# Patient Record
Sex: Female | Born: 1945 | Race: White | Hispanic: No | Smoking: Former smoker
Health system: Southern US, Community
[De-identification: ages and names within clinical notes are randomized; demographics above are authoritative.]

## PROBLEM LIST (undated history)

## (undated) DIAGNOSIS — G629 Polyneuropathy, unspecified: Secondary | ICD-10-CM

## (undated) DIAGNOSIS — I059 Rheumatic mitral valve disease, unspecified: Secondary | ICD-10-CM

## (undated) DIAGNOSIS — E119 Type 2 diabetes mellitus without complications: Secondary | ICD-10-CM

## (undated) DIAGNOSIS — F32A Depression, unspecified: Secondary | ICD-10-CM

## (undated) DIAGNOSIS — Z972 Presence of dental prosthetic device (complete) (partial): Secondary | ICD-10-CM

## (undated) DIAGNOSIS — I2510 Atherosclerotic heart disease of native coronary artery without angina pectoris: Secondary | ICD-10-CM

## (undated) DIAGNOSIS — Z95810 Presence of automatic (implantable) cardiac defibrillator: Secondary | ICD-10-CM

## (undated) DIAGNOSIS — E039 Hypothyroidism, unspecified: Secondary | ICD-10-CM

## (undated) DIAGNOSIS — R011 Cardiac murmur, unspecified: Secondary | ICD-10-CM

## (undated) DIAGNOSIS — Z950 Presence of cardiac pacemaker: Secondary | ICD-10-CM

## (undated) DIAGNOSIS — I509 Heart failure, unspecified: Secondary | ICD-10-CM

## (undated) DIAGNOSIS — C801 Malignant (primary) neoplasm, unspecified: Secondary | ICD-10-CM

## (undated) DIAGNOSIS — F329 Major depressive disorder, single episode, unspecified: Secondary | ICD-10-CM

## (undated) DIAGNOSIS — IMO0001 Reserved for inherently not codable concepts without codable children: Secondary | ICD-10-CM

## (undated) DIAGNOSIS — I272 Pulmonary hypertension, unspecified: Secondary | ICD-10-CM

## (undated) DIAGNOSIS — I495 Sick sinus syndrome: Secondary | ICD-10-CM

## (undated) HISTORY — PX: PACEMAKER INSERTION: SHX728

---

## 1985-04-13 DIAGNOSIS — C801 Malignant (primary) neoplasm, unspecified: Secondary | ICD-10-CM

## 1985-04-13 HISTORY — DX: Malignant (primary) neoplasm, unspecified: C80.1

## 2010-11-05 ENCOUNTER — Ambulatory Visit: Payer: Self-pay | Admitting: Internal Medicine

## 2010-12-18 ENCOUNTER — Inpatient Hospital Stay: Payer: Self-pay | Admitting: Psychiatry

## 2011-03-20 ENCOUNTER — Ambulatory Visit: Payer: Self-pay | Admitting: Internal Medicine

## 2011-05-04 ENCOUNTER — Emergency Department: Payer: Self-pay | Admitting: Emergency Medicine

## 2011-05-04 LAB — URINALYSIS, COMPLETE
Hyaline Cast: 3
Ketone: NEGATIVE
Nitrite: NEGATIVE
Ph: 5 (ref 4.5–8.0)
Protein: NEGATIVE
RBC,UR: 11 /HPF (ref 0–5)
Specific Gravity: 1.019 (ref 1.003–1.030)
Squamous Epithelial: 2

## 2011-05-04 LAB — COMPREHENSIVE METABOLIC PANEL
BUN: 17 mg/dL (ref 7–18)
Bilirubin,Total: 0.6 mg/dL (ref 0.2–1.0)
Calcium, Total: 9.5 mg/dL (ref 8.5–10.1)
Chloride: 99 mmol/L (ref 98–107)
EGFR (African American): >60
EGFR (Non-African Amer.): >60
Potassium: 4.1 mmol/L (ref 3.5–5.1)
SGPT (ALT): 25 U/L
Sodium: 139 mmol/L (ref 136–145)
Total Protein: 8.2 g/dL (ref 6.4–8.2)

## 2011-05-04 LAB — CBC
HCT: 39.7 % (ref 35.0–47.0)
HGB: 13.3 g/dL (ref 12.0–16.0)
MCH: 32.1 pg (ref 26.0–34.0)
MCV: 96 fL (ref 80–100)
RBC: 4.15 10*6/uL (ref 3.80–5.20)
RDW: 13.3 % (ref 11.5–14.5)

## 2011-05-04 LAB — CK TOTAL AND CKMB (NOT AT ARMC): CK, Total: 68 U/L (ref 21–215)

## 2011-05-04 LAB — LIPASE, BLOOD: Lipase: 218 U/L (ref 73–393)

## 2011-10-19 ENCOUNTER — Inpatient Hospital Stay: Payer: Self-pay | Admitting: Internal Medicine

## 2011-10-19 LAB — TROPONIN I: Troponin-I: <0.02 ng/mL

## 2011-10-19 LAB — URINALYSIS, COMPLETE
Bilirubin,UR: NEGATIVE
Glucose,UR: NEGATIVE mg/dL (ref 0–75)
Ketone: NEGATIVE
Protein: NEGATIVE
RBC,UR: 14 /HPF (ref 0–5)
Squamous Epithelial: 3
WBC UR: 2 /HPF (ref 0–5)

## 2011-10-19 LAB — CBC
HCT: 38.8 % (ref 35.0–47.0)
HGB: 12.5 g/dL (ref 12.0–16.0)
MCH: 32.3 pg (ref 26.0–34.0)
MCV: 100 fL (ref 80–100)
WBC: 7.5 10*3/uL (ref 3.6–11.0)

## 2011-10-19 LAB — CBC WITH DIFFERENTIAL/PLATELET
Basophil #: 0.1 10*3/uL (ref 0.0–0.1)
Eosinophil #: 0.4 10*3/uL (ref 0.0–0.7)
Eosinophil %: 5.2 %
HCT: 39.2 % (ref 35.0–47.0)
HGB: 13 g/dL (ref 12.0–16.0)
Lymphocyte #: 3.3 10*3/uL (ref 1.0–3.6)
Lymphocyte %: 40.4 %
MCHC: 33.1 g/dL (ref 32.0–36.0)
Monocyte #: 0.9 x10 3/mm (ref 0.2–0.9)
Neutrophil %: 43.2 %
Platelet: 226 10*3/uL (ref 150–440)
RDW: 13 % (ref 11.5–14.5)
WBC: 8.3 10*3/uL (ref 3.6–11.0)

## 2011-10-19 LAB — COMPREHENSIVE METABOLIC PANEL
Alkaline Phosphatase: 60 U/L (ref 50–136)
BUN: 23 mg/dL — ABNORMAL HIGH (ref 7–18)
Chloride: 103 mmol/L (ref 98–107)
Co2: 32 mmol/L (ref 21–32)
Creatinine: 0.99 mg/dL (ref 0.60–1.30)
EGFR (African American): >60
EGFR (Non-African Amer.): 59 — ABNORMAL LOW
Glucose: 191 mg/dL — ABNORMAL HIGH (ref 65–99)
SGOT(AST): 31 U/L (ref 15–37)
Sodium: 138 mmol/L (ref 136–145)
Total Protein: 7.4 g/dL (ref 6.4–8.2)

## 2011-10-19 LAB — PRO B NATRIURETIC PEPTIDE: B-Type Natriuretic Peptide: 645 pg/mL — ABNORMAL HIGH (ref 0–125)

## 2011-10-19 LAB — TSH: Thyroid Stimulating Horm: 6.33 u[IU]/mL — ABNORMAL HIGH

## 2011-10-20 LAB — CBC WITH DIFFERENTIAL/PLATELET
Basophil #: 0.1 10*3/uL (ref 0.0–0.1)
Eosinophil #: 0.4 10*3/uL (ref 0.0–0.7)
HCT: 37.4 % (ref 35.0–47.0)
HGB: 12.3 g/dL (ref 12.0–16.0)
Lymphocyte #: 2.9 10*3/uL (ref 1.0–3.6)
Lymphocyte %: 37.3 %
MCH: 32.6 pg (ref 26.0–34.0)
MCHC: 32.9 g/dL (ref 32.0–36.0)
MCV: 99 fL (ref 80–100)
Neutrophil #: 3.7 10*3/uL (ref 1.4–6.5)
RDW: 13 % (ref 11.5–14.5)

## 2011-10-20 LAB — HEMOGLOBIN A1C: Hemoglobin A1C: 6.8 % — ABNORMAL HIGH (ref 4.2–6.3)

## 2011-10-20 LAB — BASIC METABOLIC PANEL
Anion Gap: 9 (ref 7–16)
Calcium, Total: 8.6 mg/dL (ref 8.5–10.1)
EGFR (Non-African Amer.): >60
Glucose: 117 mg/dL — ABNORMAL HIGH (ref 65–99)
Osmolality: 280 (ref 275–301)
Potassium: 3.5 mmol/L (ref 3.5–5.1)
Sodium: 139 mmol/L (ref 136–145)

## 2011-10-20 LAB — LIPID PANEL
Ldl Cholesterol, Calc: 104 mg/dL — ABNORMAL HIGH (ref 0–100)
Triglycerides: 192 mg/dL (ref 0–200)
VLDL Cholesterol, Calc: 38 mg/dL (ref 5–40)

## 2011-10-20 LAB — CK TOTAL AND CKMB (NOT AT ARMC)
CK, Total: 60 U/L (ref 21–215)
CK, Total: 69 U/L (ref 21–215)

## 2011-10-20 LAB — TROPONIN I
Troponin-I: 0.03 ng/mL
Troponin-I: <0.02 ng/mL

## 2011-11-18 ENCOUNTER — Ambulatory Visit: Payer: Self-pay | Admitting: Internal Medicine

## 2011-11-19 ENCOUNTER — Ambulatory Visit: Payer: Self-pay | Admitting: Internal Medicine

## 2011-12-15 ENCOUNTER — Ambulatory Visit: Payer: Self-pay | Admitting: Emergency Medicine

## 2012-02-08 ENCOUNTER — Emergency Department: Payer: Self-pay | Admitting: Emergency Medicine

## 2012-02-08 LAB — URINALYSIS, COMPLETE
Nitrite: NEGATIVE
Ph: 5 (ref 4.5–8.0)
Protein: NEGATIVE
RBC,UR: 2 /HPF (ref 0–5)
WBC UR: 2 /HPF (ref 0–5)

## 2012-02-08 LAB — CBC
HGB: 12.5 g/dL (ref 12.0–16.0)
MCHC: 34.4 g/dL (ref 32.0–36.0)
Platelet: 201 10*3/uL (ref 150–440)
RDW: 13.5 % (ref 11.5–14.5)

## 2012-02-08 LAB — COMPREHENSIVE METABOLIC PANEL
Albumin: 3.6 g/dL (ref 3.4–5.0)
Alkaline Phosphatase: 55 U/L (ref 50–136)
Anion Gap: 7 (ref 7–16)
BUN: 19 mg/dL — ABNORMAL HIGH (ref 7–18)
Bilirubin,Total: 0.5 mg/dL (ref 0.2–1.0)
Creatinine: 0.85 mg/dL (ref 0.60–1.30)
EGFR (African American): >60
EGFR (Non-African Amer.): >60
Glucose: 83 mg/dL (ref 65–99)
Osmolality: 279 (ref 275–301)
Sodium: 139 mmol/L (ref 136–145)

## 2012-02-08 LAB — TROPONIN I: Troponin-I: <0.02 ng/mL

## 2012-02-08 LAB — CK TOTAL AND CKMB (NOT AT ARMC): CK, Total: 64 U/L (ref 21–215)

## 2012-06-02 ENCOUNTER — Ambulatory Visit: Payer: Self-pay | Admitting: Internal Medicine

## 2013-04-13 HISTORY — PX: PACEMAKER INSERTION: SHX728

## 2014-04-13 DIAGNOSIS — I639 Cerebral infarction, unspecified: Secondary | ICD-10-CM

## 2014-04-13 HISTORY — PX: MITRAL VALVE REPAIR: SHX2039

## 2014-04-13 HISTORY — DX: Cerebral infarction, unspecified: I63.9

## 2014-05-09 ENCOUNTER — Ambulatory Visit: Payer: Self-pay | Admitting: Internal Medicine

## 2014-06-11 ENCOUNTER — Observation Stay: Payer: Self-pay | Admitting: Internal Medicine

## 2014-07-03 ENCOUNTER — Ambulatory Visit: Payer: Self-pay | Admitting: Internal Medicine

## 2014-08-05 NOTE — Consult Note (Signed)
General Aspect the patient is a 69 year old female with history non-Hodgkin's lymphoma status post Adriamycin and radiation treatment, history of mitral valve disease with echo done in February of 2013 revealing ejection fraction of 50% with moderate mitral regurgitation and mild tricuspid regurgitation. Her left atrium was mildly dilated at 4 cm. Electrocardiogram in the office revealed sinus rhythm with first degree AV block and right bundle branch block with a PR interval of 256 ms. Patient was admitted after presenting to her primary care's office complaining of warm feeling in her chest. She was felt to of had atrial fibrillation and was sent to the emergency room. EKG done emergency room revealed what appeared to be sinus rhythm with first degree AV block with PACs and blocked PACs. Occasional strips suggested Wenkebach rhythm. She was hemodynamically stable. she has ruled out for myocardial infarction. She continues to have first degree AV block with occasional blocked PACs.   Physical Exam:   GEN well developed, well nourished, no acute distress    HEENT PERRL    NECK supple    RESP normal resp effort  clear BS    CARD Regular rate and rhythm  Murmur    Murmur Systolic    Systolic Murmur axilla    ABD denies tenderness  normal BS    LYMPH negative neck    EXTR negative cyanosis/clubbing, negative edema    SKIN normal to palpation    NEURO cranial nerves intact, motor/sensory function intact    PSYCH A+O to time, place, person   Review of Systems:   Subjective/Chief Complaint warm sensation in her face and chest. Some irregular heartbeat    General: Fatigue    Skin: No Complaints    ENT: No Complaints    Eyes: No Complaints    Neck: No Complaints    Respiratory: No Complaints    Cardiovascular: No Complaints    Gastrointestinal: No Complaints    Genitourinary: No Complaints    Vascular: No Complaints    Musculoskeletal: No Complaints    Neurologic: No  Complaints    Hematologic: No Complaints    Endocrine: No Complaints    Psychiatric: No Complaints    Review of Systems: All other systems were reviewed and found to be negative    Medications/Allergies Reviewed Medications/Allergies reviewed   EKG:   Abnormal RBBB    Interpretation first-degree AV block with blocked PACs    Aspirin: SOB    Impression 69 year old female history of non-Hodgkin's lymphoma in the past treated with Adriamycin and chemotherapy. Echocardiogram done in the recent past revealed EF of 50% with moderate to severe MR and left atrial enlargement. She was admitted with weakness and fatigue and a warm sensation in her chest. EKG was felt to show atrial fibrillation. EKG done a number as room has some artifact but suggests probable sinus rhythm with first degree AV block with frequent PACs and occasional blocked PACs. She is ruled out for a myocardial infarction. She has no further chest pain. Echocardiogram during this admission is pending. She is hemodynamically stable. Would recommend discontinuing nitro paste. Would continue heparin overnight to consider discontinuation of this in the morning. Will review echocardiogram when available ambulate in the morning. Consideration for discharge if ambulating without symptoms.    Plan 1. Continue with current medications with the exception will discontinue nitroglycerin ointment 2. Consider discontinuation of heparin in the morning if she is hemodynamically stable and without significant arrhythmia 3. Ambulate in a.m. and await echocardiogram. 4. Should  her echo be unremarkable and unchanged from previous echo and she is able to ambulate without symptoms, consideration for discharge with outpatient followup.   Electronic Signatures: Teodoro Spray (MD)  (Signed 09-Jul-13 20:18)  Authored: General Aspect/Present Illness, History and Physical Exam, Review of System, EKG , Allergies, Impression/Plan   Last Updated:  09-Jul-13 20:18 by Teodoro Spray (MD)

## 2014-08-05 NOTE — H&P (Signed)
PATIENT NAME:  Christie Carpenter, Christie Carpenter MR#:  [id number] DATE OF BIRTH:  12-16-1945  DATE OF ADMISSION:  10/19/2011  REFERRING PHYSICIAN: Dr. Renard Hamper PRIMARY CARE PHYSICIAN: Dr. Quay Burow with [facility]   CHIEF COMPLAINT: Sent in from PCPs office for new onset atrial fibrillation.   HISTORY OF PRESENT ILLNESS: Patient is a pleasant 69 year old Caucasian female with history of non-Hodgkin's lymphoma at age 68 currently in remission status post chemotherapy and radiation, valvular heart disease with mitral insufficiency with ejection fraction of 50% and left ventricular hypertrophy per echo 05/2011 per records and subclinical hypothyroidism who was referred by her primary care's office for new onset atrial fibrillation. Patient had been complaining of acute onset feelings of palpitations, irregular heart rate, intermittent warm sensation feeling over the chest and face and some chest discomfort on the left side starting last night. The symptoms continued on into today and patient went to her primary care office where she was found to have atrial fibrillation on EKG done. For further work-up she was referred here. Currently she has no chest pain. EKG here shows atrial fibrillation with competing junctional pacemaker, rate 79. She has negative troponin and no acute ST changes. Hospitalist service was contacted for further evaluation and management.   PAST MEDICAL HISTORY:  1. Depression.  2. History of mitral insufficiency per records. 3. Subclinical hypothyroidism, currently on no medications for it.  4. Allergies. 5. Asthma. 6. Non-Hodgkin lymphoma at age 2, went through chemotherapy and radiation. 7. History of gallstones.  8. History of pneumonia.   CURRENT MEDICATIONS: None.   FAMILY HISTORY: Non-Hodgkin's lymphoma in daughter. Paternal grandfather has leukemia. Sister and an aunt have colon cancer and aunt with breast cancer.   SOCIAL HISTORY: Former smoker, quit smoking multiple years ago.  Occasional alcohol. No drug use.   REVIEW OF SYSTEMS: CONSTITUTIONAL: No fever. Some fatigue and malaise. Has 7 pound weight loss intentional over the past 3 to 4 weeks. EYES: Had some blurry vision last night, none now. No glaucoma or cataracts. ENT: No tinnitus or hearing loss. RESPIRATORY: No cough. Feels somewhat short of breath. No wheezing. No painful respiration or chronic obstructive pulmonary disease. CARDIOVASCULAR: Some chest discomfort previously, none now. No orthopnea or swelling in the legs. No history of arrhythmia or high blood pressure in the past. No history of syncope. Positive for palpitations. GASTROINTESTINAL: Some nausea. No vomiting or diarrhea, abdominal pain. No hematemesis. GENITOURINARY: No dysuria or frequency. ENDOCRINE: No polyuria, nocturia, or thyroid problems. Active. She has subclinical hypothyroidism. HEME/LYMPH: No anemia or easy bruising. SKIN: No new rashes. MUSCULOSKELETAL: Some chronic arthritis in knees. NEUROLOGIC: No numbness or weakness. No cerebrovascular accident or transient ischemic attack. PSYCHIATRIC: Has history of depression.   PHYSICAL EXAMINATION:  VITAL SIGNS: Temperature 98 on arrival, pulse 82 on arrival, currently 78, respiratory rate 16, blood pressure 140/85, oxygen saturation 96% on room air.   GENERAL: Patient is a pleasant Caucasian female laying in bed, no obvious distress, talking in full sentences.   HEENT: Normocephalic, atraumatic. Pupils are equal and reactive. Anicteric sclerae. Moist mucous membranes.    NECK: Supple. No thyroid tenderness. No cervical lymphadenopathy.   CARDIOVASCULAR: S1, S2 irregularly irregular. No murmurs appreciated. No rubs or gallops.   LUNGS: Clear to auscultation without crackles or wheezing.   ABDOMEN: Soft, nontender, nondistended. Positive normoactive bowel sounds all quadrants. No organomegaly noted.   EXTREMITIES: No significant edema.   SKIN: No obvious rashes.   NEUROLOGIC: Cranial nerves  II through XII grossly intact. Strength  is 5/5 all extremities. Sensation is intact to light touch.   PSYCH: Awake, alert, oriented x3. Pleasant, conversant.   LABORATORY, DIAGNOSTIC AND RADIOLOGICAL DATA: BNP 645, BUN 23, creatinine 0.99, glucose 199, sodium 138, potassium 3.9, magnesium 1.8. LFTs within normal limits. Troponin negative. CK-MB 1.2. TSH 6.3, free thyroxine 0.98, d-dimer within normal limits. CBC within normal limits. EKG: Atrial fibrillation with competing junctional rhythm, left axis deviation, some Q waves in 3, aVF. Some T wave inversions in inferior leads. No acute ST elevations or depressions.   ASSESSMENT AND PLAN: We have a pleasant 69 year old female with no history of arrhythmias in the past with history of non-Hodgkin's lymphoma in remission, subclinical hypothyroidism, depression who was referred here from primary care's office for blunted atrial fibrillation. Patient became symptomatic last night and has no history of atrial fibrillation or any other arrhythmias in the past. Currently she has no chest pain. Rate is controlled. Will start the patient on heparin drip. She also complained of some chest pain and has allergy to aspirin. She was given Plavix. She has negative troponin. Would order an echocardiogram. Would also get cardiology consult with Dr. Nehemiah Massed who has seen the before. Would completely rule out MI by cycling cardiac markers, check lipids as well.   Patient does have hyperglycemia. Although has no history of diabetes. We would check a hemoglobin A1c.    Patient does have some shortness of breath. She has mildly elevated BNP, but does not appear to be grossly volume overloaded. She has furthermore no significant lower extremity edema or crackles. She has been given Lasix and we would monitor ins and outs. Furthermore, I doubt patient is having PE. She has Well's criteria of about 1 and negative d-dimer. Again, we would see what the echocardiogram shows and follow  up the x-ray of the chest which has been ordered. Nitro paste has been applied as well.   Patient does have slightly elevated TSH but is on no medications for her thyroid. Her FT4 is within normal limits.   CODE STATUS: Patient is FULL CODE.   TOTAL TIME SPENT: 60 minutes.    ____________________________ Vivien Presto, MD sa:cms D: 10/19/2011 21:39:01 ET T: 10/20/2011 06:52:53 ET JOB#: [id number]  cc: Vivien Presto, MD, <Dictator> Ellamae Sia, MD  Vivien Presto MD ELECTRONICALLY SIGNED 11/02/2011 16:17

## 2014-08-05 NOTE — Discharge Summary (Signed)
PATIENT NAME:  Christie Carpenter, Christie Carpenter MR#:  [id number] DATE OF BIRTH:  Aug 22, 1945  DATE OF ADMISSION:  10/19/2011 DATE OF DISCHARGE:  10/21/2011  PRIMARY CARE PHYSICIAN: Kingsley Spittle, MD  CONSULTANTS: Serafina Royals, MD.  DISCHARGE DIAGNOSES:  1. New paroxysmal atrial fibrillation.  2. Diabetes.  3. Non-Hodgkin's lymphoma.  4. Hypothyroidism.  PROCEDURES: None.   CONDITION: Stable.   HOME MEDICATION: Cetirizine 10 mg p.o. 1 tablet p.o. daily p.r.n. for allergies.   DIET: ADA diet.   ACTIVITY: As tolerated.   FOLLOWUP CARE:  Followup with PCP within 1 to 2 weeks. Followup with Dr. Nehemiah Massed within 1 week.   REASON FOR ADMISSION: New-onset atrial fibrillation, sent from PCP's office.   HOSPITAL COURSE: The patient is a 69 year old Caucasian female with a history of non-Hodgkin's lymphoma at age 47, currently in remission status post chemotherapy and radiation and valvular heart disease with mitral insufficiency with ejection fraction of 50%. The patient was referred by her primary care physician for new onset atrial fibrillation. The patient complained of acute onset, feelings of palpitations, irregular heart rate, and intermittent warmth sensation feeling over the chest and face and some chest discomfort on the left. She was found to have atrial fibrillation in the ED with a heart rate of 79 and negative troponin. The patient was admitted for new onset of atrial fibrillation. After admission the patient was treated with heparin drip. Echocardiogram today showed normal ejection fraction. I called Dr. Nehemiah Massed who recommended no anticoagulation and no beta blocker since the patient has first-degree AV block. Dr. Nehemiah Massed recommended follow-up with him as an outpatient. In addition to the atrial fibrillation, the patient also had hyperglycemia. We checked a hemoglobin A1c which is 6.8. The patient is diagnosed with diabetes. After admission the patient's atrial fibrillation spontaneously changed to  normal sinus rhythm. The patient has no palpitations, chest pain, or shortness of breath.  Vital signs are stable. Today's vital signs are temperature 98.1, blood pressure 122/65, pulse 75, and oxygen saturation 97% in room air. Physical examination is unremarkable. She is clinically stable and will be discharged home today.   I discussed the discharge plan with the patient, the nurse, and Dr. Nehemiah Massed.   TIME SPENT: About 32 minutes.  ____________________________ Demetrios Loll, MD qc:slb D: 10/21/2011 15:52:55 ET T: 10/22/2011 10:21:07 ET JOB#: [id number]  cc: Demetrios Loll, MD, <Dictator> Ellamae Sia, MD Demetrios Loll MD ELECTRONICALLY SIGNED 10/22/2011 14:50

## 2014-08-12 NOTE — Discharge Summary (Signed)
PATIENT NAME:  Christie Carpenter, RINGLEY MR#:  [id number] DATE OF BIRTH:  1945/11/23  DATE OF ADMISSION:  06/11/2014 DATE OF DISCHARGE:  06/12/2014  PRESENTING COMPLAINT: Shortness of breath.   DISCHARGE DIAGNOSES: 1.  Acute on chronic diastolic heart failure.  2.  Pulmonary edema.  3.  Hypothyroidism.  4.  Morbid obesity.  5.  History of Hodgkin disease.  6.  History of paroxysmal atrial fibrillation.  7.  History of sick sinus syndrome.  8.  Pulmonary artery hypertension. 9.  Left-sided pleural effusion.   CONSULTATIONS: Dr. Lujean Amel, cardiology.   PROCEDURES: A 2-D echocardiogram February 29th showing left ventricular ejection fraction 55% to 60%. Normal global left ventricular systolic function. Mildly dilated left atrium. Moderate pleural effusion in the left lateral region. Moderate thickening of the anterior and posterior mitral valve leaflets. Severe mitral valve regurgitation. Mild aortic valve sclerosis without stenosis. Moderate tricuspid regurgitation. Mildly elevated pulmonary artery systolic pressures. Mildly increased left ventricular posterior wall thickness.   CT angiography of chest for PE showing no evidence of pulmonary embolus. Interstitial prominence bilaterally with small bilateral pleural effusions, right greater than left. Concern for mild interstitial edema. Scattered coronary artery calcifications.   HISTORY OF PRESENT ILLNESS: This 69 year old Caucasian woman with past medical history of non-Hodgkin lymphoma status post radiation and chemotherapy in 1987, paroxysmal atrial fibrillation and permanent pacemaker insertion presents with shortness of breath for about 1 week, gradually worsening. On presentation to the Emergency Room, she was noted to be hypoxic with ambulation with oxygen saturations in the high 80s.   HOSPITAL COURSE BY PROBLEM:  1.  Acute on chronic diastolic heart failure exacerbation: The patient's symptoms improved significantly after diuresis with  Lasix. She was negative greater than 1 liter at the time discharge. Respiratory status had improved significantly.  2.  Hypothyroidism: Continue with Synthroid. This will need to be followed up in the outpatient setting. 3.  Paroxysmal atrial fibrillation: Rate was controlled throughout hospitalization. She was in normal sinus rhythm. She continues on Eliquis for prevention.   DISCHARGE MEDICATIONS: 1.  Metoprolol succinate 25 mg 1 tablet once a day.  2.  Cetirizine 10 mg 1 tablet once a day.  3.  Levothyroxine 50 mcg 1 tablet once a day.  4.  Eliquis 5 mg 1 tablet twice a day.  5.  ProAir HFA CFC free 90 mcg/inhalations 2 puffs inhaled every 4 to 6 hours as needed.   DISCHARGE PHYSICAL EXAMINATION: VITAL SIGNS: Temperature 97.2, pulse 99, respirations 18, blood pressure 180/62, oxygenation 93% on room air.  GENERAL: No acute distress.  CARDIOVASCULAR: Regular rate and rhythm. No murmurs, rubs, or gallops. No peripheral edema. Peripheral pulses 2+.  RESPIRATORY: Fine bibasilar crackles. Good air movement. No respiratory distress.  ABDOMEN: Soft, nontender, nondistended. No guarding or rebound. Bowel sounds normal.  PSYCHIATRIC: The patient alert and oriented x4. She is anxious for discharge.  DIAGNOSTIC DATA: Sodium 140, potassium 3.4, chloride 99, bicarb 33, BUN 24, creatinine 1.03, glucose 122. BNP on admission 1250. Cardiac enzymes negative x3. TSH 9.49. Free T4 normal at 1.23. White blood cells 7.2, hemoglobin 11.5, platelets 216,000, MCV 97.  CONDITION ON DISCHARGE: Stable.   DISPOSITION: The patient is being discharged with no further home health needs. She is actually being discharged directly to her cardiac follow-up appointment.   DISCHARGE DIET: Heart healthy, low sodium diet.   ACTIVITY LIMITATIONS: None.   TIME FRAME FOR FOLLOW-UP: Please follow up today with cardiology as scheduled. Please follow up within the next 1  to 2 weeks with Dr. Quay Burow, primary care.  TIME SPENT ON  DISCHARGE: 45 minutes.  ____________________________ Earleen Newport. Volanda Napoleon, MD cpw:sb D: 06/14/2014 21:22:36 ET T: 06/15/2014 08:43:31 ET JOB#: [id number]  cc: Barnetta Chapel P. Volanda Napoleon, MD, <Dictator> Ellamae Sia, MD Aldean Jewett MD ELECTRONICALLY SIGNED 06/23/2014 10:51

## 2014-08-12 NOTE — H&P (Signed)
PATIENT NAME:  Christie Carpenter, Christie Carpenter MR#:  [id number] DATE OF BIRTH:  Feb 01, 1946  DATE OF ADMISSION:  06/10/2014  REFERRING PHYSICIAN:  Gretchen Short. Beather Arbour, MD  PRIMARY CARE PHYSICIAN:  [facility], Ellamae Sia, MD  CARDIOLOGY:  Corey Skains, MD, of [facility].  CHIEF COMPLAINT:  Shortness of breath.   HISTORY OF PRESENT ILLNESS:  This is a 69 year old Caucasian female with a past medical history of non-Hodgkin lymphoma status post radiation and chemotherapy back around 1987, paroxysmal atrial fibrillation, and permanent pacemaker insertion, presenting with shortness of breath described mainly as dyspnea on exertion with 1-week duration and gradual worsening over the last week, now with shortness of breath at rest, associated with dry cough, orthopnea, as well as edema, however, denies any chest pain or further symptomatology. No fevers or chills. In the Emergency Department, noted to be hypoxic with ambulation, saturating in the high 80s.   REVIEW OF SYSTEMS: CONSTITUTIONAL:  Denies fevers, chills, fatigue, or weakness.  EYES:  Denies blurred vision, double vision, or eye pain.  EARS, NOSE, AND THROAT:  Denies tinnitus, ear pain, or hearing loss. RESPIRATORY:  Positive for shortness of breath. Denies cough.  CARDIOVASCULAR:  Denies chest pain or palpitations. Positive for orthopnea and edema.  GASTROINTESTINAL: Denies nausea, vomiting, diarrhea, or abdominal pain. GENITOURINARY:  Denies dysuria or hematuria.  ENDOCRINE:  Denies nocturia or thyroid problems.  HEMATOLOGIC AND LYMPHATIC:  Denies easy bruising or bleeding.  SKIN:  Denies rashes or lesions.  MUSCULOSKELETAL:  Denies pain in the neck, back, shoulders, knees, or hips or arthritic symptoms. NEUROLOGIC:  Denies paralysis or paresthesias.  PSYCHIATRIC:  Denies anxiety or depressive symptoms.   Otherwise, full review of systems performed by me is negative.  PAST MEDICAL HISTORY:  Permanent pacemaker insertion, non-Hodgkin  lymphoma, depression not otherwise specified, paroxysmal atrial fibrillation.   SOCIAL HISTORY:  Remote tobacco use. Occasional alcohol use.   FAMILY HISTORY:  No known cardiovascular or pulmonary disorders.   ALLERGIES:  ASPIRIN.    HOME MEDICATIONS:  Include Zyrtec 10 mg p.o. daily and metoprolol 25 mg p.o. at bedtime as well as levothyroxine 50 mcg p.o. daily.   PHYSICAL EXAMINATION: VITAL SIGNS:  Temperature is 97.7, heart rate 93, respirations 16, blood pressure 126/70, saturating 100% on supplemental O2, weight 83.9 kg, BMI 30.8.  GENERAL:  Well-nourished, well-developed, Caucasian female currently in no acute distress.  HEAD:  Normocephalic, atraumatic.  EYES:  Pupils are equal, round, and reactive to light. Extraocular muscles are intact. No scleral icterus.  MOUTH:  Moist mucosal membranes. Dentition is intact. No abscess noted.  EAR, NOSE, AND THROAT:  Clear with exudates. No external lesions.  NECK:  Supple. No thyromegaly. No nodules. No JVD.  PULMONARY:  Bibasilar crackles. Good air entry bilaterally.  CHEST:  Nontender to palpation.  CARDIOVASCULAR:  S1 and S2, regular rate and rhythm. No murmurs, rubs, or gallops. There is 1+ edema to the shins bilaterally. Pedal pulses are 2+ bilaterally.  GASTROINTESTINAL:  Soft, nontender, nondistended. No masses. Positive bowel sounds. No hepatosplenomegaly.  MUSCULOSKELETAL:  No swelling, clubbing, or edema. Range of motion is full in all extremities.  NEUROLOGIC:  Cranial nerves II through XII are intact. No gross focal neurological deficits. Sensation is intact. Reflexes are intact.  SKIN:  No ulceration, lesion, rash, or cyanosis. Skin is warm and dry. Turgor is intact.  PSYCHIATRIC:  Mood and affect are within normal limits. The patient is awake, alert and oriented x 3. Insight and judgment are intact.  LABORATORY DATA:  EKG reveals a ventricular paced rhythm.   Sodium is 139, potassium 3.5, chloride 102, bicarbonate 26, BUN 19,  creatinine 1.06, glucose 125. Troponin is less than 0.02. WBC is 7.5, hemoglobin 12.3, platelets 232,000. TSH is 9.49, however, free T4 is 1.23.   She had a chest CT performed with no evidence of PE; however, interstitial prominence bilaterally with small bilateral pleural effusion.   ASSESSMENT AND PLAN:  This is a 69 year old Caucasian female with a history of non-Hodgkin lymphoma status post radiation and chemotherapy back around 1987, paroxysmal atrial fibrillation, and pacemaker insertion, presenting with dyspnea on exertion.  1.  Acute congestive heart failure, unknown ejection fraction with associated pulmonary edema. Diuresis with Lasix. Follow urine output, renal function, and intake/output. We will check transthoracic echocardiogram. We will place on telemetry. We will consult cardiology. She follows with [facility].  2.  Hypothyroidism, unspecified mechanism. Continue with Synthroid.  3.  Venous thromboembolism prophylaxis with heparin subcutaneously.  CODE STATUS:  The patient is a full code.  TIME SPENT:  45 minutes.     ____________________________ Aaron Mose. Hower, MD dkh:nb D: 06/11/2014 02:03:06 ET T: 06/11/2014 02:57:44 ET JOB#: [id number]  cc: Aaron Mose. Hower, MD, <Dictator> DAVID Woodfin Ganja MD ELECTRONICALLY SIGNED 06/11/2014 4:20

## 2014-08-12 NOTE — Consult Note (Signed)
PATIENT NAME:  Christie Carpenter, Christie Carpenter MR#:  [id number] DATE OF BIRTH:  [birth year]  DATE OF CONSULTATION:  06/11/2014  CONSULTING PHYSICIAN:  Dwayne D. Clayborn Bigness, MD REFERRED BY:  Aaron Mose. Hower, MD  PRIMARY PHYSICIAN: Ellamae Sia, MD  CARDIOLOGIST:  Corey Skains, MD   INDICATIONS:  Shortness of breath, heart failure, history of sick sinus syndrome.  HISTORY OF PRESENT ILLNESS:  The patient is a 69 year old female who presented for cardiac evaluation, with past history of non-Hodgkin's lymphoma years ago, status post radiation and chemotherapy back in 1988.  The patient has had occasional paroxysmal atrial fibrillation, but presented at this time after visiting her daughter in November, she developed complete heart block and had a permanent pacemaker placement in [place]. The patient has done reasonably well and is having her pacemaker checked there remotely. Presented here after 2-3  days of shortness of breath, dyspnea on exertion after about a week. The patient was admitted for further evaluation and found to have dry cough, orthopnea, mild edema. Denied any chest pain. No fever, chills, or sweats. No sputum production. She was hypoxic. Denied any chest pain. Saturations were in the 80s, so she was admitted.  The patient had an echocardiogram which showed preserved LV function.   REVIEW OF SYSTEMS: No blackout spells or syncope. No nausea, vomiting. Denies fever, chills, sweats. No weight loss. No weight gain. No hemoptysis or hematemesis. Denies bright red blood per rectum. No vision change or hearing change. No sputum production. She has had a little bit of a cough. Persistent shortness of breath. Mild PND and orthopnea.   PAST MEDICAL HISTORY: Sick sinus syndrome, complete heart block, atrial fibrillation, depression, history of non-Hodgkin's lymphoma, mild obesity.   SOCIAL HISTORY: Remote tobacco abuse. No recent smoking or alcohol consumption.   FAMILY HISTORY: Essentially negative.    ALLERGIES: ASPIRIN.   MEDICATIONS: Zyrtec 10 mg a day, metoprolol 25 mg at bedtime, levothyroxine.   PHYSICAL EXAMINATION: VITAL SIGNS: Blood pressure was 130/70, pulse of 90, respiratory rate of 14, afebrile.  HEENT: Normocephalic, atraumatic. Pupils equal and reactive to light.  NECK: Supple. No significant JVD, bruits, or adenopathy.  LUNGS: Bilateral rhonchi. No wheezing, faint crackles in the bases. Adequate air movement.  HEART: Regular rate and rhythm. Positive S4, systolic ejection murmur left sternal border. PMI nondisplaced.  ABDOMEN: Benign.  EXTREMITIES: With trace edema.  NEUROLOGIC: Intact.  SKIN: Normal.  EKG: Paced rhythm.  LABORATORY DATA: Sodium 139, potassium 3.5, chloride of 102, bicarbonate 26, BUN of 19, creatinine 1.06, glucose of 125. Troponin was negative. White count 7.5, hemoglobin 12.3, platelet count of 232,000.   IMAGING:  CT of the chest was negative for PE. The patient had an echocardiogram which showed EF of about 50%-55%. No effusion.   ASSESSMENT:  1.  Acute congestive heart failure.  2.  Shortness of breath.  3.  Hypothyroidism.  4.  Mild obesity.  5.  History of Hodgkin disease.   6.  History of paroxysmal atrial fibrillation. 7.  History of sick sinus syndrome.   PLAN: Agree with admit. Rule out for myocardial infarction. Agree with followup cardiac enzymes. Followup EKGs and telemetry. The patient  improve with diuresis on Lasix, would recommend  continue beta blockade therapy. Will consider increasing the dose. Consider low-dose ACE inhibitor therapy.  Consider inhalers for possible underlying COPD. May need pulmonary evaluation, even though she does not smoke, but used to smoke in the past. Must consider consistent anticoagulation for paroxysmal atrial fibrillation and  a borderline [place] score because she says she may have borderline diabetes as well as hypertension. No history of stroke. Must rule out coronary artery disease. Would recommend  either a functional study or cardiac catheterization. I am also concerned about her radiation causing trouble with her pericardial sac as well as chemotherapy leading to a more restrictive process and diastolic dysfunction. I think at this point, she has done reasonably well and outpatient evaluation with a functional study or cardiac catheterization can be done and continue low-dose Lasix as necessary to help with fluid accumulation. Would consider inhalers if necessary and she may need pulmonary evaluation for possible COPD.  Again, the patient should be referred to Dr. Nehemiah Massed as an outpatient.    ____________________________ Loran Senters. Clayborn Bigness, MD ddc:LT D: 06/11/2014 62:44:69 ET T: 06/11/2014 17:57:25 ET JOB#: [id number]  cc: Dwayne D. Clayborn Bigness, MD, <Dictator> Yolonda Kida MD ELECTRONICALLY SIGNED 07/01/2014 17:41

## 2014-09-17 ENCOUNTER — Encounter: Payer: Self-pay | Admitting: Emergency Medicine

## 2014-09-17 ENCOUNTER — Emergency Department: Payer: Medicare Other

## 2014-09-17 ENCOUNTER — Inpatient Hospital Stay
Admission: EM | Admit: 2014-09-17 | Discharge: 2014-09-20 | DRG: 291 | Disposition: A | Discharge status: Home / Self Care | Payer: Medicare Other | Attending: Internal Medicine | Admitting: Internal Medicine

## 2014-09-17 DIAGNOSIS — I480 Paroxysmal atrial fibrillation: Secondary | ICD-10-CM | POA: Diagnosis present

## 2014-09-17 DIAGNOSIS — R0600 Dyspnea, unspecified: Secondary | ICD-10-CM

## 2014-09-17 DIAGNOSIS — I272 Other secondary pulmonary hypertension: Secondary | ICD-10-CM | POA: Diagnosis present

## 2014-09-17 DIAGNOSIS — J029 Acute pharyngitis, unspecified: Secondary | ICD-10-CM | POA: Diagnosis present

## 2014-09-17 DIAGNOSIS — Z8572 Personal history of non-Hodgkin lymphomas: Secondary | ICD-10-CM

## 2014-09-17 DIAGNOSIS — E876 Hypokalemia: Secondary | ICD-10-CM | POA: Diagnosis present

## 2014-09-17 DIAGNOSIS — E039 Hypothyroidism, unspecified: Secondary | ICD-10-CM | POA: Diagnosis present

## 2014-09-17 DIAGNOSIS — R000 Tachycardia, unspecified: Secondary | ICD-10-CM | POA: Diagnosis present

## 2014-09-17 DIAGNOSIS — J189 Pneumonia, unspecified organism: Secondary | ICD-10-CM | POA: Diagnosis present

## 2014-09-17 DIAGNOSIS — I340 Nonrheumatic mitral (valve) insufficiency: Secondary | ICD-10-CM | POA: Diagnosis present

## 2014-09-17 DIAGNOSIS — Z950 Presence of cardiac pacemaker: Secondary | ICD-10-CM | POA: Diagnosis present

## 2014-09-17 DIAGNOSIS — I509 Heart failure, unspecified: Secondary | ICD-10-CM

## 2014-09-17 DIAGNOSIS — I442 Atrioventricular block, complete: Secondary | ICD-10-CM | POA: Diagnosis present

## 2014-09-17 DIAGNOSIS — R0682 Tachypnea, not elsewhere classified: Secondary | ICD-10-CM | POA: Diagnosis present

## 2014-09-17 DIAGNOSIS — Z87891 Personal history of nicotine dependence: Secondary | ICD-10-CM

## 2014-09-17 DIAGNOSIS — I5033 Acute on chronic diastolic (congestive) heart failure: Principal | ICD-10-CM

## 2014-09-17 DIAGNOSIS — I2510 Atherosclerotic heart disease of native coronary artery without angina pectoris: Secondary | ICD-10-CM | POA: Diagnosis present

## 2014-09-17 DIAGNOSIS — R0602 Shortness of breath: Secondary | ICD-10-CM

## 2014-09-17 DIAGNOSIS — E119 Type 2 diabetes mellitus without complications: Secondary | ICD-10-CM

## 2014-09-17 DIAGNOSIS — I059 Rheumatic mitral valve disease, unspecified: Secondary | ICD-10-CM | POA: Diagnosis present

## 2014-09-17 DIAGNOSIS — K5900 Constipation, unspecified: Secondary | ICD-10-CM | POA: Diagnosis present

## 2014-09-17 DIAGNOSIS — I252 Old myocardial infarction: Secondary | ICD-10-CM

## 2014-09-17 HISTORY — DX: Type 2 diabetes mellitus without complications: E11.9

## 2014-09-17 HISTORY — DX: Pulmonary hypertension, unspecified: I27.20

## 2014-09-17 HISTORY — DX: Rheumatic mitral valve disease, unspecified: I05.9

## 2014-09-17 HISTORY — DX: Reserved for inherently not codable concepts without codable children: IMO0001

## 2014-09-17 HISTORY — DX: Presence of cardiac pacemaker: Z95.0

## 2014-09-17 HISTORY — DX: Depression, unspecified: F32.A

## 2014-09-17 HISTORY — DX: Hypothyroidism, unspecified: E03.9

## 2014-09-17 HISTORY — DX: Heart failure, unspecified: I50.9

## 2014-09-17 HISTORY — DX: Malignant (primary) neoplasm, unspecified: C80.1

## 2014-09-17 HISTORY — DX: Cardiac murmur, unspecified: R01.1

## 2014-09-17 HISTORY — DX: Major depressive disorder, single episode, unspecified: F32.9

## 2014-09-17 HISTORY — DX: Atherosclerotic heart disease of native coronary artery without angina pectoris: I25.10

## 2014-09-17 LAB — CBC
HCT: 37.4 % (ref 35.0–47.0)
Hemoglobin: 12.3 g/dL (ref 12.0–16.0)
MCH: 31.2 pg (ref 26.0–34.0)
MCHC: 32.9 g/dL (ref 32.0–36.0)
MCV: 94.9 fL (ref 80.0–100.0)
Platelets: 191 10*3/uL (ref 150–440)
RBC: 3.94 MIL/uL (ref 3.80–5.20)
RDW: 14.6 % — ABNORMAL HIGH (ref 11.5–14.5)
WBC: 10.1 10*3/uL (ref 3.6–11.0)

## 2014-09-17 LAB — TROPONIN I
Troponin I: <0.03 ng/mL (ref <0.031)
Troponin I: 0.03 ng/mL (ref <0.031)
Troponin I: 0.04 ng/mL — ABNORMAL HIGH (ref <0.031)
Troponin I: 0.04 ng/mL — ABNORMAL HIGH (ref <0.031)

## 2014-09-17 LAB — CREATININE, SERUM
CREATININE: 0.89 mg/dL (ref 0.44–1.00)
GFR calc non Af Amer: >60 mL/min (ref >60)

## 2014-09-17 LAB — BRAIN NATRIURETIC PEPTIDE: B Natriuretic Peptide: 309 pg/mL — ABNORMAL HIGH (ref 0.0–100.0)

## 2014-09-17 LAB — BASIC METABOLIC PANEL
Anion gap: 12 (ref 5–15)
BUN: 13 mg/dL (ref 6–20)
CALCIUM: 8.9 mg/dL (ref 8.9–10.3)
CO2: 26 mmol/L (ref 22–32)
Chloride: 96 mmol/L — ABNORMAL LOW (ref 101–111)
Creatinine, Ser: 0.86 mg/dL (ref 0.44–1.00)
GFR calc non Af Amer: >60 mL/min (ref >60)
GLUCOSE: 144 mg/dL — AB (ref 65–99)
POTASSIUM: 2.6 mmol/L — AB (ref 3.5–5.1)
SODIUM: 134 mmol/L — AB (ref 135–145)

## 2014-09-17 LAB — PROTIME-INR
INR: 1
Prothrombin Time: 13.4 seconds (ref 11.4–15.0)

## 2014-09-17 LAB — TSH: TSH: 3.456 u[IU]/mL (ref 0.350–4.500)

## 2014-09-17 LAB — MAGNESIUM: MAGNESIUM: 1.6 mg/dL — AB (ref 1.7–2.4)

## 2014-09-17 MED ORDER — IPRATROPIUM-ALBUTEROL 0.5-2.5 (3) MG/3ML IN SOLN
3.0000 mL | Freq: Once | RESPIRATORY_TRACT | Status: AC
  Administered 2014-09-17: 3 mL via RESPIRATORY_TRACT

## 2014-09-17 MED ORDER — FUROSEMIDE 10 MG/ML IJ SOLN
40.0000 mg | Freq: Two times a day (BID) | INTRAMUSCULAR | Status: DC
  Administered 2014-09-17 – 2014-09-18 (×3): 40 mg via INTRAVENOUS
  Filled 2014-09-17 (×3): qty 4

## 2014-09-17 MED ORDER — HEPARIN SODIUM (PORCINE) 5000 UNIT/ML IJ SOLN: 5000.0000 [IU] | Freq: Three times a day (TID) | INTRAMUSCULAR | Status: DC

## 2014-09-17 MED ORDER — APIXABAN 5 MG PO TABS
5.0000 mg | ORAL_TABLET | Freq: Two times a day (BID) | ORAL | Status: DC
  Administered 2014-09-17 – 2014-09-20 (×7): 5 mg via ORAL
  Filled 2014-09-17 (×7): qty 1

## 2014-09-17 MED ORDER — LEVOTHYROXINE SODIUM 75 MCG PO TABS
75.0000 ug | ORAL_TABLET | Freq: Every day | ORAL | Status: DC
  Administered 2014-09-18 – 2014-09-20 (×3): 75 ug via ORAL
  Filled 2014-09-17 (×3): qty 1

## 2014-09-17 MED ORDER — ZOLPIDEM TARTRATE 5 MG PO TABS
5.0000 mg | ORAL_TABLET | Freq: Every evening | ORAL | Status: DC | PRN
  Administered 2014-09-17 – 2014-09-19 (×3): 5 mg via ORAL
  Filled 2014-09-17 (×3): qty 1

## 2014-09-17 MED ORDER — LORATADINE 10 MG PO TABS
10.0000 mg | ORAL_TABLET | Freq: Every day | ORAL | Status: DC
  Administered 2014-09-18 – 2014-09-20 (×3): 10 mg via ORAL
  Filled 2014-09-17 (×3): qty 1

## 2014-09-17 MED ORDER — POTASSIUM CHLORIDE CRYS ER 20 MEQ PO TBCR
40.0000 meq | EXTENDED_RELEASE_TABLET | ORAL | Status: AC
  Administered 2014-09-17 (×2): 40 meq via ORAL
  Filled 2014-09-17 (×2): qty 2

## 2014-09-17 MED ORDER — POLYETHYLENE GLYCOL 3350 17 G PO PACK
17.0000 g | PACK | Freq: Every day | ORAL | Status: DC | PRN
Start: 2014-09-17 — End: 2014-09-20
  Administered 2014-09-18: 17 g via ORAL
  Filled 2014-09-17: qty 1

## 2014-09-17 MED ORDER — IPRATROPIUM-ALBUTEROL 0.5-2.5 (3) MG/3ML IN SOLN
3.0000 mL | RESPIRATORY_TRACT | Status: DC
  Administered 2014-09-17 – 2014-09-18 (×6): 3 mL via RESPIRATORY_TRACT
  Filled 2014-09-17 (×7): qty 3

## 2014-09-17 MED ORDER — POTASSIUM CHLORIDE CRYS ER 20 MEQ PO TBCR
40.0000 meq | EXTENDED_RELEASE_TABLET | ORAL | Status: AC
  Administered 2014-09-17: 40 meq via ORAL

## 2014-09-17 MED ORDER — FUROSEMIDE 10 MG/ML IJ SOLN
40.0000 mg | INTRAMUSCULAR | Status: AC
  Administered 2014-09-17: 40 mg via INTRAVENOUS

## 2014-09-17 MED ORDER — IPRATROPIUM-ALBUTEROL 0.5-2.5 (3) MG/3ML IN SOLN
RESPIRATORY_TRACT | Status: AC
  Filled 2014-09-17: qty 3

## 2014-09-17 MED ORDER — METOPROLOL SUCCINATE ER 50 MG PO TB24
50.0000 mg | ORAL_TABLET | Freq: Every day | ORAL | Status: DC
  Administered 2014-09-17 – 2014-09-20 (×4): 50 mg via ORAL
  Filled 2014-09-17 (×4): qty 1

## 2014-09-17 MED ORDER — SODIUM CHLORIDE 0.9 % IJ SOLN
3.0000 mL | Freq: Two times a day (BID) | INTRAMUSCULAR | Status: DC
  Administered 2014-09-17 – 2014-09-20 (×7): 3 mL via INTRAVENOUS

## 2014-09-17 MED ORDER — POTASSIUM CHLORIDE CRYS ER 20 MEQ PO TBCR
EXTENDED_RELEASE_TABLET | ORAL | Status: AC
  Filled 2014-09-17: qty 2

## 2014-09-17 MED ORDER — ACETAMINOPHEN 325 MG PO TABS
650.0000 mg | ORAL_TABLET | Freq: Four times a day (QID) | ORAL | Status: DC | PRN
  Administered 2014-09-17: 650 mg via ORAL
  Filled 2014-09-17: qty 2

## 2014-09-17 MED ORDER — IPRATROPIUM-ALBUTEROL 0.5-2.5 (3) MG/3ML IN SOLN
RESPIRATORY_TRACT | Status: AC
  Administered 2014-09-17: 3 mL via RESPIRATORY_TRACT
  Filled 2014-09-17: qty 3

## 2014-09-17 MED ORDER — ACETAMINOPHEN 650 MG RE SUPP: 650.0000 mg | Freq: Four times a day (QID) | RECTAL | Status: DC | PRN

## 2014-09-17 MED ORDER — FUROSEMIDE 10 MG/ML IJ SOLN
INTRAMUSCULAR | Status: AC
  Administered 2014-09-17: 40 mg via INTRAVENOUS
  Filled 2014-09-17: qty 4

## 2014-09-17 MED ORDER — ONDANSETRON HCL 4 MG PO TABS: 4.0000 mg | ORAL_TABLET | Freq: Four times a day (QID) | ORAL | Status: DC | PRN

## 2014-09-17 MED ORDER — ONDANSETRON HCL 4 MG/2ML IJ SOLN: 4.0000 mg | Freq: Four times a day (QID) | INTRAMUSCULAR | Status: DC | PRN

## 2014-09-17 MED ORDER — BENZONATATE 100 MG PO CAPS
100.0000 mg | ORAL_CAPSULE | Freq: Three times a day (TID) | ORAL | Status: DC | PRN
Start: 2014-09-17 — End: 2014-09-20
  Administered 2014-09-17 – 2014-09-20 (×5): 100 mg via ORAL
  Filled 2014-09-17 (×5): qty 1

## 2014-09-17 NOTE — H&P (Signed)
[facility] Cardiology  CARDIOLOGY CONSULT NOTE  Patient ID: Christie Carpenter MRN: [id number] DOB/AGE: 70/17/1947 70 y.o.  Admit date: 09/17/2014 Referring Physician Myrtis Ser M.D. Primary Physician Ellin Goodie M.D. Primary Cardiologist Serafina Royals M.D. Reason for Consultation congestive heart failure  HPI: The patient is a 69 year old female with history of chronic diastolic congestive heart failure, pulmonary hypertension, mitral valve disease, sick sinus syndrome status post dual-chamber pacemaker, and paroxysmal atrial fibrillation. The patient apparently was in her usual state of health until last week when she first experienced a sore throat or postnasal drip. During the past several days, the patient  experienced progressive nonproductive cough and exertional dyspnea. The patient denies peripheral edema or weight gain increasing fullness in her abdomen. She had a very similar hospitalization 2/19 with fullness in her abdomen which improved after diuresis. In the emergency room, the patient was short of breath, with tachycardia. Chest x-ray revealed evidence for pulmonary edema. The patient denies chest pain. ECG revealed ventricular paced rhythm.  Review of systems complete and found to be negative unless listed above     Past Medical History  Diagnosis Date  . CHF (congestive heart failure)   . Pulmonary hypertension   . Mitral valve problem   . Coronary artery disease   . Presence of permanent cardiac pacemaker   . Heart murmur   . Shortness of breath dyspnea   . Hypothyroidism   . Diabetes mellitus without complication   . Depression   . Cancer 1987    non hodgkins lymphma    Past Surgical History  Procedure Laterality Date  . Pacemaker insertion      Prescriptions prior to admission  Medication Sig Dispense Refill Last Dose  . albuterol (PROVENTIL HFA;VENTOLIN HFA) 108 (90 BASE) MCG/ACT inhaler Inhale 2 puffs into the lungs every 6 (six) hours as needed for wheezing or  shortness of breath.   PRN at PRN  . apixaban (ELIQUIS) 5 MG TABS tablet Take 5 mg by mouth 2 (two) times daily.   09/16/2014 at Unknown time  . cetirizine (ZYRTEC) 10 MG tablet Take 10 mg by mouth daily as needed for allergies.   PRN at PRN  . furosemide (LASIX) 40 MG tablet Take 40 mg by mouth daily.   09/17/2014 at Unknown time  . levothyroxine (SYNTHROID, LEVOTHROID) 75 MCG tablet Take 75 mcg by mouth daily before breakfast.   unknown at unknown  . metoprolol succinate (TOPROL-XL) 50 MG 24 hr tablet Take 50 mg by mouth daily. Take with or immediately following a meal.   09/16/2014 at 1000   History   Social History  . Marital Status: Married    Spouse Name: N/A  . Number of Children: N/A  . Years of Education: N/A   Occupational History  . Not on file.   Social History Main Topics  . Smoking status: Former Research scientist (life sciences)  . Smokeless tobacco: Never Used  . Alcohol Use: No  . Drug Use: No  . Sexual Activity: Not on file   Other Topics Concern  . Not on file   Social History Narrative  . No narrative on file    Family History  Problem Relation Age of Onset  . Leukemia Other       Review of systems complete and found to be negative unless listed above      PHYSICAL EXAM  General: Well developed, well nourished, in no acute distress HEENT:  Normocephalic and atramatic Neck:  No JVD.  Lungs: Clear bilaterally to auscultation  and percussion. Heart: HRRR . Normal S1 and S2 without gallops or murmurs.  Abdomen: Bowel sounds are positive, abdomen soft and non-tender  Msk:  Back normal, normal gait. Normal strength and tone for age. Extremities: No clubbing, cyanosis or edema.   Neuro: Alert and oriented X 3. Psych:  Good affect, responds appropriately  Labs:   Lab Results  Component Value Date   WBC 10.1 09/17/2014   HGB 12.3 09/17/2014   HCT 37.4 09/17/2014   MCV 94.9 09/17/2014   PLT 191 09/17/2014    Recent Labs Lab 09/17/14 0814  NA 134*  K 2.6*  CL 96*  CO2 26   BUN 13  CREATININE 0.86  CALCIUM 8.9  GLUCOSE 144*   Lab Results  Component Value Date   TROPONINI <0.03 09/17/2014   No results found for: CHOL No results found for: HDL No results found for: LDLCALC No results found for: TRIG No results found for: CHOLHDL No results found for: LDLDIRECT    Radiology: Dg Chest Port 1 View  09/17/2014   CLINICAL DATA:  Shortness of breath.  EXAM: PORTABLE CHEST - 1 VIEW  COMPARISON:  CT 06/10/2014. Chest x-ray 06/10/2014. Chest x-ray 10/19/2011 .  FINDINGS: Mediastinum and hilar structures normal. Cardiac pacer noted with lead tips in right atrium and right ventricle . Cardiomegaly with pulmonary vascular prominence and diffuse interstitial prominence noted consistent with congestive heart failure. Small pleural effusions cannot be excluded. No pneumothorax. No acute bony abnormality.  IMPRESSION: Congestive heart failure with mild pulmonary interstitial edema. Small pleural effusions cannot be excluded.   Electronically Signed   By: Marcello Moores  Register   On: 09/17/2014 08:38    EKG: Ventricular paced rhythm  ASSESSMENT AND PLAN:  #1 acute on chronic diastolic congestive heart failure, with chest x-ray revealing pulmonary edema with chief complaint of abdominal fullness, contact diuresis after intravenous furosemide #2 paroxysmal atrial fibrillation, chads Vascor of 4, early on apixaban for stroke prevention #3 sinus syndrome, status post  pacemaker, with paced rhythm on telemetry #4 severe mitral regurgitation, followed as outpatient by Dr. Nehemiah Massed, not currently felt to be a candidate for surgical mitral valve repair at this time #5 pulmonary hypertension secondary to mitral regurgitation  Recommendations  #1 agree with overall current therapy #2 continue diuresis #3 continue apixaban for stroke prevention #4 monitor daily weights #5 constipation about low sodium diet   Signed: Dale Strausser MD,PhD, [facility] 09/17/2014, 12:59 PM

## 2014-09-17 NOTE — H&P (Signed)
[facility] NAME: Christie Carpenter    MR#:  [id number]  DATE OF BIRTH:  04-08-1946  DATE OF ADMISSION:  09/17/2014  PRIMARY CARE PHYSICIAN: Ellamae Sia, MD   REQUESTING/REFERRING PHYSICIAN: Dr Jacqualine Code  CHIEF COMPLAINT:   Chief Complaint  Patient presents with  . Shortness of Breath    HISTORY OF PRESENT ILLNESS:  Christie Carpenter  is a 69 y.o. female with a known history of non-Hodgkin's lymphoma status post radiation and chemotherapy in 1987, paroxysmal atrial fibrillation, permanent pacemaker insertion due to heart block, severe mitral valve disease with pulmonary hypertension and diastolic heart failure who presents with 1 week of worsening sore throat, sneezing and runny nose with shortness of breath. She has had no nausea vomiting diarrhea chest pain or fever. She has become increasingly short of breath. At 1:30 on the morning of admission she could not lay down and could not stop wheezing or coughing. She has had occasional clear sputum but mostly a dry cough. She has tried Robitussin without any improvement in symptoms. She has continued to take Lasix as directed without missing any doses. She has not noted any weight gain and feels she may have lost weight. She has not noticed any edema but does feel very full in her abdomen and has a sensation of being unable to take a deep breath. On presentation to the emergency room she is short of breath with significantly increased work of breathing, she is tachycardic and hypokalemic. Chest x-ray shows pulmonary edema. She is being admitted for treatment of diastolic congestive heart failure exacerbation.   Past Medical History  Diagnosis Date  . CHF (congestive heart failure)   . Pulmonary hypertension   . Mitral valve problem   . Coronary artery disease   . Presence of permanent cardiac pacemaker   . Heart murmur   . Shortness of breath dyspnea   . Hypothyroidism   . Diabetes  mellitus without complication   . Depression   . Cancer 1987    non hodgkins lymphma    PAST SURGICAL HISTORY:   Past Surgical History  Procedure Laterality Date  . Pacemaker insertion      SOCIAL HISTORY:   History  Substance Use Topics  . Smoking status: Former Research scientist (life sciences)  . Smokeless tobacco: Never Used  . Alcohol Use: No    FAMILY HISTORY:   Family History  Problem Relation Age of Onset  . Leukemia Other     DRUG ALLERGIES:  No Known Allergies  REVIEW OF SYSTEMS:   Review of Systems  Constitutional: Positive for weight loss and malaise/fatigue. Negative for fever, chills and diaphoresis.  HENT: Positive for congestion and sore throat.   Respiratory: Positive for cough, sputum production, shortness of breath and wheezing. Negative for hemoptysis and stridor.   Cardiovascular: Positive for orthopnea. Negative for chest pain, palpitations, claudication, leg swelling and PND.  Gastrointestinal: Negative for heartburn, nausea, vomiting, abdominal pain, diarrhea and constipation.  Genitourinary: Negative for dysuria and frequency.  Musculoskeletal: Negative for myalgias.  Skin: Negative for rash.  Neurological: Positive for weakness. Negative for dizziness, sensory change, speech change, focal weakness and headaches.  Psychiatric/Behavioral: Negative for depression. The patient is not nervous/anxious.     MEDICATIONS AT HOME:   Prior to Admission medications   Medication Sig Start Date End Date Taking? Authorizing Provider  albuterol (PROVENTIL HFA;VENTOLIN HFA) 108 (90 BASE) MCG/ACT inhaler Inhale 2 puffs into the lungs every 6 (six) hours  as needed for wheezing or shortness of breath.   Yes Historical Provider, MD  apixaban (ELIQUIS) 5 MG TABS tablet Take 5 mg by mouth 2 (two) times daily.   Yes Historical Provider, MD  cetirizine (ZYRTEC) 10 MG tablet Take 10 mg by mouth daily as needed for allergies.   Yes Historical Provider, MD  furosemide (LASIX) 40 MG tablet  Take 40 mg by mouth daily.   Yes Historical Provider, MD  levothyroxine (SYNTHROID, LEVOTHROID) 75 MCG tablet Take 75 mcg by mouth daily before breakfast.   Yes Historical Provider, MD  metoprolol succinate (TOPROL-XL) 50 MG 24 hr tablet Take 50 mg by mouth daily. Take with or immediately following a meal.   Yes Historical Provider, MD      VITAL SIGNS:  Blood pressure 119/69, pulse 103, temperature 97.5 F (36.4 C), temperature source Oral, resp. rate 23, height 5' 5.5" (1.664 m), weight 81.647 kg (180 lb), SpO2 99 %.  PHYSICAL EXAMINATION:  GENERAL:  69 y.o.-year-old patient sitting up straight, short of breath  EYES: Pupils equal, round, reactive to light and accommodation. No scleral icterus. Extraocular muscles intact.  HEENT: Head atraumatic, normocephalic. Oropharynx and nasopharynx clear. good dentition moist mucous membranes  NECK:  Supple, no jugular venous distention. No thyroid enlargement, no tenderness.  LUNGS:  short of breath, moderate distress, good air movement, bibasilar crackles, no wheezes rhonchi or rails, no coughing  CARDIOVASCULAR:  tachycardic, regularS1, S2 normal. No murmurs, rubs, or gallops.  ABDOMEN: Soft, nontender, nondistended. Bowel sounds present. No organomegaly or mass. no guarding no rebound   EXTREMITIES: No pedal edema, cyanosis, or clubbing.  NEUROLOGIC: Cranial nerves II through XII are intact. Muscle strength 5/5 in all extremities. Sensation intact. Gait not checked.  PSYCHIATRIC: The patient is alert and oriented x 3.  anxious SKIN: No obvious rash, lesion, or ulcer.   LABORATORY PANEL:   CBC  Recent Labs Lab 09/17/14 0814  WBC 10.1  HGB 12.3  HCT 37.4  PLT 191   ------------------------------------------------------------------------------------------------------------------  Chemistries   Recent Labs Lab 09/17/14 0814  NA 134*  K 2.6*  CL 96*  CO2 26  GLUCOSE 144*  BUN 13  CREATININE 0.86  CALCIUM 8.9    ------------------------------------------------------------------------------------------------------------------  Cardiac Enzymes  Recent Labs Lab 09/17/14 0814  TROPONINI <0.03   ------------------------------------------------------------------------------------------------------------------  RADIOLOGY:  Dg Chest Port 1 View  09/17/2014   CLINICAL DATA:  Shortness of breath.  EXAM: PORTABLE CHEST - 1 VIEW  COMPARISON:  CT 06/10/2014. Chest x-ray 06/10/2014. Chest x-ray 10/19/2011 .  FINDINGS: Mediastinum and hilar structures normal. Cardiac pacer noted with lead tips in right atrium and right ventricle . Cardiomegaly with pulmonary vascular prominence and diffuse interstitial prominence noted consistent with congestive heart failure. Small pleural effusions cannot be excluded. No pneumothorax. No acute bony abnormality.  IMPRESSION: Congestive heart failure with mild pulmonary interstitial edema. Small pleural effusions cannot be excluded.   Electronically Signed   By: Marcello Moores  Register   On: 09/17/2014 08:38    EKG:   Orders placed or performed in visit on 06/11/14  . EKG 12-Lead  . EKG 12-Lead    IMPRESSION AND PLAN:   Active Problems:   CHF (congestive heart failure)   Severe mitral insufficiency   Hypothyroidism   Pulmonary hypertension   CAD (coronary artery disease)   Pacemaker  Problem #1 acute exacerbation of chronic diastolic heart failure  - She has received 1 dose of 40 mg IV Lasix in the emergency room and is diuresing  well  - Continue to monitor I's and O's, daily weights, low sodium diet  - Continue with Lasix 40 mg IV twice a day  - Cardiology consult pending  - Monitor on telemetry, cycle cardiac enzymes    problem #2 severe mitral insufficiency with pulmonary hypertension:  - Cardiology consultation pending  - Continue with diuresis  - She may be a good candidate for minoxidil   Problem #3 hypokalemia - Replace potassium, check magnesium, monitor  closely with Lasix  #4 coronary artery disease status post MI in the past - No chest pain at this time, but very short of breath - Cycle cardiac enzymes  #5 paroxysmal atrial fibrillation, third-degree heart block, pacemaker: - Currently tachycardic with a mostly paced rhythm - Continue Eliquis and metoprolol  #6 hypothyroidism - Check TSH, continue Synthroid  All the records are reviewed and case discussed with ED provider. Management plans discussed with the patient, family and they are in agreement.  CODE STATUS: FULL  TOTAL TIME TAKING CARE OF THIS PATIENT: 55 minutes.    Myrtis Ser M.D on 09/17/2014 at 10:19 AM  Between 7am to 6pm - Pager - [phone]  After 6pm go to [email or web address] - password [email or web address] [facility]  Office  [phone]  CC: Primary care physician; Ellamae Sia, MD

## 2014-09-17 NOTE — ED Notes (Signed)
Patient c/o shortness of breath.  States had a cold and cough over the weekend.  Patient c/o increased shortness of breath with MDIs at home.  Patient has wheezing on arrival.

## 2014-09-17 NOTE — Progress Notes (Signed)
Patient admitted today with shortness of breath and a CHF exacerbation. Alert and oriented. Paced on tele. Patient is short of breath but does not require oxygen. Patient's support system are her two sisters, her three daughters live out of town and her husband passed away this past 18-Dec-2022. Patient expressed she has felt suicidal in the past, but not in the recent past. She states "I told my doctor I would never do that to my girls". Spiritual care consult initiated, patient is receptive. No complaints of anything but the shortness of breath. Will continue to monitor.

## 2014-09-17 NOTE — Progress Notes (Signed)
Patient requesting medication for sleep. [facility] Doc, Posey Pronto paged. MD ordered to give 10mg  ambien QHS PRN. Also notified MD that patient's third troponin was positive at 0.04.

## 2014-09-17 NOTE — Progress Notes (Signed)
Called DR. Hower about tessalon perles for pt for cough. MD will put order in.

## 2014-09-17 NOTE — ED Provider Notes (Signed)
[facility] Emergency Department Provider Note  ____________________________________________  Time seen: Approximately 8:08 AM  I have reviewed the triage vital signs and the nursing notes.   HISTORY  Chief Complaint Shortness of Breath    HPI Christie Carpenter is a 69 y.o. female with a history of pulmonary hypertension, diastolic heart failure, and congestive heart failure who presents with dyspnea.  Since Friday the patient has reported having a slight cough, and increasing shortness of breath. This is worsened throughout the last 24 hours. She does also have an inhaler at home, but states no improvement. She developed severe shortness of breath with walking,  Shortness of breath is worse with laying flat. She denies swelling in her legs. She denies fever. She is not having a productive cough. She denies chest pain.  She states she had a similar episode a few months ago where she was admitted to the hospital.    Past Medical History  Diagnosis Date  . CHF (congestive heart failure)   . Pulmonary hypertension   . Mitral valve problem     There are no active problems to display for this patient.   Past Surgical History  Procedure Laterality Date  . Pacemaker insertion      No current outpatient prescriptions on file.  Allergies Review of patient's allergies indicates no known allergies.  No family history on file.  Social History History  Substance Use Topics  . Smoking status: Former Research scientist (life sciences)  . Smokeless tobacco: Not on file  . Alcohol Use: No    Review of Systems Constitutional: No fever/chills Eyes: No visual changes. ENT: No sore throat. Cardiovascular: Denies chest pain. Respiratory: See history of present illness Gastrointestinal: No abdominal pain.  No nausea, no vomiting.  No diarrhea.  No constipation. Genitourinary: Negative for dysuria. Musculoskeletal: Negative for back pain. Skin: Negative for rash. Neurological:  Negative for headaches, focal weakness or numbness.   She denies history of blood clots, recent surgery, long travels, or swelling in one leg.   10-point ROS otherwise negative.  ____________________________________________   PHYSICAL EXAM:  VITAL SIGNS: ED Triage Vitals  Enc Vitals Group     BP 09/17/14 0754 144/94 mmHg     Pulse Rate 09/17/14 0754 99     Resp 09/17/14 0754 23     Temp 09/17/14 0754 97.5 F (36.4 C)     Temp Source 09/17/14 0754 Oral     SpO2 09/17/14 0754 100 %     Weight 09/17/14 0754 180 lb (81.647 kg)     Height 09/17/14 0754 5' 5.5" (1.664 m)     Head Cir --      Peak Flow --      Pain Score 09/17/14 0753 0     Pain Loc --      Pain Edu? --      Excl. in [place]? --     Constitutional: Alert and oriented. In moderate respiratory distress. Eyes: Conjunctivae are normal. PERRL. EOMI. Head: Atraumatic. Nose: No congestion/rhinnorhea. Mouth/Throat: Mucous membranes are moist.  Oropharynx non-erythematous. Neck: No stridor.   Cardiovascular: Regular but tachycardic. Grossly normal heart sounds though auscultation is difficult due to wheezing.  Good peripheral circulation. Respiratory: Patient's speaks in 1-2 word sentences, accessory muscle use, diffuse end expiratory wheezing and questionable rales in the bases bilaterally. Gastrointestinal: Soft and nontender. No distention. No abdominal bruits. No CVA tenderness. Musculoskeletal: No lower extremity tenderness nor edema.  No joint effusions. Neurologic:  Normal speech and language. No gross focal  neurologic deficits are appreciated. Speech is normal. Skin:  Skin is warm, dry and intact. No rash noted. Psychiatric: Mood and affect are normal. Speech and behavior are normal.  ____________________________________________   LABS (all labs ordered are listed, but only abnormal results are displayed)  Labs Reviewed  CBC - Abnormal; Notable for the following:    RDW 14.6 (*)    All other components within  normal limits  BRAIN NATRIURETIC PEPTIDE - Abnormal; Notable for the following:    B Natriuretic Peptide 309.0 (*)    All other components within normal limits  PROTIME-INR  BASIC METABOLIC PANEL  TROPONIN I  TSH   ____________________________________________  EKG  Interpreted and reviewed by me EKG demonstrates an ventricular paced rhythm at a rate of 115 bpm. His ST elevation in lead 2 and lead 3, however without chest pain in the setting of pain ventricular pacemaker this does not represent acute coronary syndrome. Discussed EKG with Dr. Josefa Half at 935 AM, agrees not evidence acute ACS. Recommends diuresis.  ____________________________________________  RADIOLOGY  IMPRESSION: Congestive heart failure with mild pulmonary interstitial edema. Small pleural effusions cannot be excluded. ____________________________________________   PROCEDURES  Procedure(s) performed: None  Critical Care performed: No  ____________________________________________   INITIAL IMPRESSION / ASSESSMENT AND PLAN / ED COURSE  Pertinent labs & imaging results that were available during my care of the patient were reviewed by me and considered in my medical decision making (see chart for details).  Patient has many comorbidities that could lead to acute dyspnea. In consideration, currently she is saturating 100% on room air, but she does exhibit moderate increased work of breathing. We have given nebulizer, with little improvement. I am hesitant to say that this is related to asthma or COPD as the patient reports she has not had asthma for years, and she has no history of COPD but is a prior smoker. I believe this is likely to represent cardiac possible congestive heart failure, pulmonary hypertension, or other etiology such as infection or pneumonia though she reports no fever or infectious symptoms. We will continue to observe her closely, at present time. She is showing slight improvement and is in  no significant distress. We will await chest x-ray, laboratory evaluation, and I have paged Dr. Nehemiah Massed of cardiology to discuss.  ----------------------------------------- 8:47 AM on 09/17/2014 -----------------------------------------  Agents chest x-ray demonstrates obvious pulmonary interstitial edema. Given the patient's symptomatology and previous history, I will initiate diuresis. She is presently hypertensive. Continue to await callback from cardiology as well as additional lab values. ____________________________________________  ----------------------------------------- 9:16 AM on 09/17/2014 -----------------------------------------  Patient is feeling improved. She is not hypoxic. Chest x-ray does demonstrate acute interstitial edema. Have given her IV Lasix and plan to diurese her. In addition, her potassium returned low have repleted this orally. I will discuss with the hospitalist service admission now due to the patient's increased work of breathing and dyspnea.  Still await a page back from [facility] cardiology.  FINAL CLINICAL IMPRESSION(S) / ED DIAGNOSES  Final diagnoses:  Acute on chronic diastolic heart failure  Acute dyspnea  Hypokalemia   I updated the patient on the plan of care and plans for admission. Discussed with Dr. Talitha Givens.  Admitted, condition improved  Delman Kitten, MD 09/17/14 [phone]

## 2014-09-18 DIAGNOSIS — E876 Hypokalemia: Secondary | ICD-10-CM | POA: Diagnosis present

## 2014-09-18 DIAGNOSIS — I5033 Acute on chronic diastolic (congestive) heart failure: Secondary | ICD-10-CM | POA: Diagnosis present

## 2014-09-18 DIAGNOSIS — E119 Type 2 diabetes mellitus without complications: Secondary | ICD-10-CM | POA: Diagnosis present

## 2014-09-18 DIAGNOSIS — I340 Nonrheumatic mitral (valve) insufficiency: Secondary | ICD-10-CM | POA: Diagnosis present

## 2014-09-18 DIAGNOSIS — E039 Hypothyroidism, unspecified: Secondary | ICD-10-CM | POA: Diagnosis present

## 2014-09-18 DIAGNOSIS — Z950 Presence of cardiac pacemaker: Secondary | ICD-10-CM | POA: Diagnosis not present

## 2014-09-18 DIAGNOSIS — I272 Other secondary pulmonary hypertension: Secondary | ICD-10-CM | POA: Diagnosis present

## 2014-09-18 DIAGNOSIS — R000 Tachycardia, unspecified: Secondary | ICD-10-CM | POA: Diagnosis present

## 2014-09-18 DIAGNOSIS — J029 Acute pharyngitis, unspecified: Secondary | ICD-10-CM | POA: Diagnosis present

## 2014-09-18 DIAGNOSIS — R0682 Tachypnea, not elsewhere classified: Secondary | ICD-10-CM | POA: Diagnosis present

## 2014-09-18 DIAGNOSIS — Z8572 Personal history of non-Hodgkin lymphomas: Secondary | ICD-10-CM | POA: Diagnosis not present

## 2014-09-18 DIAGNOSIS — I252 Old myocardial infarction: Secondary | ICD-10-CM | POA: Diagnosis not present

## 2014-09-18 DIAGNOSIS — I480 Paroxysmal atrial fibrillation: Secondary | ICD-10-CM | POA: Diagnosis present

## 2014-09-18 DIAGNOSIS — J189 Pneumonia, unspecified organism: Secondary | ICD-10-CM | POA: Diagnosis present

## 2014-09-18 DIAGNOSIS — I2510 Atherosclerotic heart disease of native coronary artery without angina pectoris: Secondary | ICD-10-CM | POA: Diagnosis present

## 2014-09-18 DIAGNOSIS — I442 Atrioventricular block, complete: Secondary | ICD-10-CM | POA: Diagnosis present

## 2014-09-18 DIAGNOSIS — I059 Rheumatic mitral valve disease, unspecified: Secondary | ICD-10-CM | POA: Diagnosis present

## 2014-09-18 DIAGNOSIS — K5900 Constipation, unspecified: Secondary | ICD-10-CM | POA: Diagnosis present

## 2014-09-18 DIAGNOSIS — Z87891 Personal history of nicotine dependence: Secondary | ICD-10-CM | POA: Diagnosis not present

## 2014-09-18 LAB — BASIC METABOLIC PANEL
ANION GAP: 8 (ref 5–15)
BUN: 14 mg/dL (ref 6–20)
CHLORIDE: 97 mmol/L — AB (ref 101–111)
CO2: 32 mmol/L (ref 22–32)
Calcium: 8.9 mg/dL (ref 8.9–10.3)
Creatinine, Ser: 0.87 mg/dL (ref 0.44–1.00)
GFR calc non Af Amer: >60 mL/min (ref >60)
Glucose, Bld: 128 mg/dL — ABNORMAL HIGH (ref 65–99)
POTASSIUM: 3.2 mmol/L — AB (ref 3.5–5.1)
Sodium: 137 mmol/L (ref 135–145)

## 2014-09-18 LAB — CBC
HCT: 35.5 % (ref 35.0–47.0)
Hemoglobin: 12 g/dL (ref 12.0–16.0)
MCH: 31.9 pg (ref 26.0–34.0)
MCHC: 33.7 g/dL (ref 32.0–36.0)
MCV: 94.6 fL (ref 80.0–100.0)
PLATELETS: 184 10*3/uL (ref 150–440)
RBC: 3.76 MIL/uL — AB (ref 3.80–5.20)
RDW: 14.5 % (ref 11.5–14.5)
WBC: 7.1 10*3/uL (ref 3.6–11.0)

## 2014-09-18 MED ORDER — MAGNESIUM SULFATE 2 GM/50ML IV SOLN
2.0000 g | Freq: Once | INTRAVENOUS | Status: AC
  Administered 2014-09-18: 2 g via INTRAVENOUS
  Filled 2014-09-18: qty 50

## 2014-09-18 MED ORDER — POTASSIUM CHLORIDE CRYS ER 20 MEQ PO TBCR
40.0000 meq | EXTENDED_RELEASE_TABLET | Freq: Once | ORAL | Status: AC
  Administered 2014-09-18: 40 meq via ORAL
  Filled 2014-09-18: qty 2

## 2014-09-18 MED ORDER — IPRATROPIUM-ALBUTEROL 0.5-2.5 (3) MG/3ML IN SOLN
3.0000 mL | RESPIRATORY_TRACT | Status: DC | PRN
  Administered 2014-09-19 – 2014-09-20 (×2): 3 mL via RESPIRATORY_TRACT
  Filled 2014-09-18 (×3): qty 3

## 2014-09-18 NOTE — Progress Notes (Signed)
Initial Nutrition Assessment  DOCUMENTATION CODES:     INTERVENTION:   (Coordination of care: ) Discussed small frequent meals with pt secondary to shortness of breath and low sodium diet education.  Medical nutrition Supplement: If unable to meet nutritional needs will add supplement  NUTRITION DIAGNOSIS:  Inadequate oral intake related to acute illness as evidenced by per patient/family report.    GOAL:  Patient will meet greater than or equal to 90% of their needs    MONITOR:   (Energy intake, Electrolyte and renal profile, Anthropometric)  REASON FOR ASSESSMENT:  Other (Comment) (diagnosis)    ASSESSMENT:  Pt admitted with CHF, pulmonary edema PMhx:  Past Medical History  Diagnosis Date  . CHF (congestive heart failure)   . Pulmonary hypertension   . Mitral valve problem   . Coronary artery disease   . Presence of permanent cardiac pacemaker   . Heart murmur   . Shortness of breath dyspnea   . Hypothyroidism   . Diabetes mellitus without complication   . Depression   . 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 Reports wanted bacon this am but kitchen wouldn't send if so got sister to bring some into her.    Intake prior to admission:  Pt reports for the last month eating 1/2 of what would normally eat. Has lost her husband recently and had pacemaker placed with starting new medication and appetite has been effected by all this.    Labs: Electrolyte and Renal Profile:  Recent Labs Lab 09/17/14 0814 09/17/14 1258 09/17/14 1259 09/18/14 0422  BUN 13  --   --  14  CREATININE 0.86  --  0.89 0.87  NA 134*  --   --  137  K 2.6*  --   --  3.2*  MG  --  1.6*  --   --    Medications: miralax, KCL, lasix  Height:  Ht Readings from Last 1 Encounters:  09/17/14 5\' 5"  (1.651 m)    Weight:  Wt Readings from Last 1 Encounters:  09/18/14 178 lb 1.6 oz  (80.786 kg)    Wt change: Pt reports weight loss of 13 pounds since December of 2015 (7% weight loss in 6 months)  Nutrition-Focused physical exam completed. Findings are WDL for fat depletion, muscle depletion, and edema.       Wt Readings from Last 10 Encounters:  09/18/14 178 lb 1.6 oz (80.786 kg)    BMI:  Body mass index is 29.64 kg/(m^2).  Estimated Nutritional Needs:  Kcal:  Using IBW of 57kg (BEE 1095 kcals (IF 1.1-1.3, AF 1.3) 1565-1850 kcals/d  Protein:  Using IBW of 57kg (1.2-1.5 g/d) 68-74 g/d  Fluid:  Using IBW of 57kg (25-48ml/kg) 1425-1782ml/d  Skin:  Reviewed, no issues  Diet Order:  Diet 2 gram sodium Room service appropriate?: Yes; Fluid consistency:: Thin  EDUCATION NEEDS:  No education needs identified at this time   Intake/Output Summary (Last 24 hours) at 09/18/14 1422 Last data filed at 09/18/14 1117  Gross per 24 hour  Intake    720 ml  Output   1530 ml  Net   -810 ml    Last BM:  6/7  MODERATE Care Level Shyah Cadmus B. Zenia Resides, [place], [place] (pager)

## 2014-09-18 NOTE — Progress Notes (Signed)
[place] at [place] NAME: Christie Carpenter    MR#:  [id number]  DATE OF BIRTH:  11/21/1945  SUBJECTIVE:  CHIEF COMPLAINT:   Chief Complaint  Patient presents with  . Shortness of Breath   Breathing much more easily this morning. No wheezing.  REVIEW OF SYSTEMS:   Review of Systems  Constitutional: Negative for fever.  Respiratory: Positive for shortness of breath. Negative for cough and wheezing.   Cardiovascular: Negative for chest pain and palpitations.  Gastrointestinal: Negative for nausea, vomiting and abdominal pain.  Genitourinary: Negative for dysuria.    DRUG ALLERGIES:  No Known Allergies  VITALS:  Blood pressure 111/66, pulse 101, temperature 98.6 F (37 C), temperature source Oral, resp. rate 19, height 5\' 5"  (1.651 m), weight 80.786 kg (178 lb 1.6 oz), SpO2 95 %.  PHYSICAL EXAMINATION:  GENERAL:  69 y.o.-year-old patient lying in the bed with no acute distress.  EYES: Pupils equal, round, reactive to light and accommodation. No scleral icterus. Extraocular muscles intact.  HEENT: Head atraumatic, normocephalic. Oropharynx and nasopharynx clear.  NECK:  Supple, no jugular venous distention. No thyroid enlargement, no tenderness.  LUNGS: Normal breath sounds bilaterally, no wheezing, rales,rhonchi or crepitation. No use of accessory muscles of respiration.  CARDIOVASCULAR: S1, S2 normal. No murmurs, rubs, or gallops.  ABDOMEN: Soft, nontender, nondistended. Bowel sounds present. No organomegaly or mass.  EXTREMITIES: No pedal edema, cyanosis, or clubbing.  NEUROLOGIC: Cranial nerves II through XII are intact. Muscle strength 5/5 in all extremities. Sensation intact. Gait not checked.  PSYCHIATRIC: The patient is alert and oriented x 3.  SKIN: No obvious rash, lesion, or ulcer.    LABORATORY PANEL:   CBC  Recent Labs Lab 09/18/14 0422  WBC 7.1  HGB 12.0  HCT 35.5  PLT 184    ------------------------------------------------------------------------------------------------------------------  Chemistries   Recent Labs Lab 09/17/14 1258  09/18/14 0422  NA  --   --  137  K  --   --  3.2*  CL  --   --  97*  CO2  --   --  32  GLUCOSE  --   --  128*  BUN  --   --  14  CREATININE  --   < > 0.87  CALCIUM  --   --  8.9  MG 1.6*  --   --   < > = values in this interval not displayed. ------------------------------------------------------------------------------------------------------------------  Cardiac Enzymes  Recent Labs Lab 09/17/14 2145  TROPONINI 0.04*   ------------------------------------------------------------------------------------------------------------------  RADIOLOGY:  Dg Chest Port 1 View  09/17/2014   CLINICAL DATA:  Shortness of breath.  EXAM: PORTABLE CHEST - 1 VIEW  COMPARISON:  CT 06/10/2014. Chest x-ray 06/10/2014. Chest x-ray 10/19/2011 .  FINDINGS: Mediastinum and hilar structures normal. Cardiac pacer noted with lead tips in right atrium and right ventricle . Cardiomegaly with pulmonary vascular prominence and diffuse interstitial prominence noted consistent with congestive heart failure. Small pleural effusions cannot be excluded. No pneumothorax. No acute bony abnormality.  IMPRESSION: Congestive heart failure with mild pulmonary interstitial edema. Small pleural effusions cannot be excluded.   Electronically Signed   By: Marcello Moores  Register   On: 09/17/2014 08:38    EKG:   Orders placed or performed during the hospital encounter of 09/17/14  . EKG 12-Lead  . EKG 12-Lead    ASSESSMENT AND PLAN:   Active Problems:   CHF (congestive heart failure)   Severe mitral insufficiency   Hypothyroidism  Pulmonary hypertension   CAD (coronary artery disease)   Pacemaker  Problem #1 acute exacerbation of chronic diastolic heart failure  - Responding very well to diuresis. Breathing is much easier today. - Appreciate cardiology  consultation  problem #2 severe mitral insufficiency with pulmonary hypertension:  - Continue with diuresis  - She may be a good candidate for minoxidil   Problem #3 hypokalemia - Replace potassium, check magnesium, monitor closely with Lasix  #4 coronary artery disease status post MI in the past - Cardiac enzymes negative. No chest pain.  #5 paroxysmal atrial fibrillation, third-degree heart block, pacemaker: - Intermittently tachycardic with a mostly paced rhythm - Continue Eliquis and metoprolol  #6 hypothyroidism - TSH normal. Continue Synthroid  All the records are reviewed and case discussed with Care Management/Social Workerr. Management plans discussed with the patient, family and they are in agreement.  CODE STATUS: Full  TOTAL TIME TAKING CARE OF THIS PATIENT: 35 minutes.   POSSIBLE D/C IN 1 DAYS, DEPENDING ON CLINICAL CONDITION.   Myrtis Ser M.D on 09/18/2014 at 3:41 PM  Between 7am to 6pm - Pager - [phone]  After 6pm go to [email or web address] - password [email or web address] [place] [facility]  Office  [phone]  CC: Primary care physician; Ellamae Sia, MD

## 2014-09-18 NOTE — Care Management (Signed)
Admitted with shortness of breath and cxr shows congestive heart failure / pulmonary edema.  Discussed during progression the need to ambulate and obtain room air exertional sats.

## 2014-09-19 ENCOUNTER — Inpatient Hospital Stay: Payer: Medicare Other

## 2014-09-19 DIAGNOSIS — I5033 Acute on chronic diastolic (congestive) heart failure: Secondary | ICD-10-CM

## 2014-09-19 LAB — BASIC METABOLIC PANEL
Anion gap: 8 (ref 5–15)
BUN: 25 mg/dL — ABNORMAL HIGH (ref 6–20)
CHLORIDE: 96 mmol/L — AB (ref 101–111)
CO2: 30 mmol/L (ref 22–32)
Calcium: 9.3 mg/dL (ref 8.9–10.3)
Creatinine, Ser: 0.88 mg/dL (ref 0.44–1.00)
GFR calc Af Amer: >60 mL/min (ref >60)
Glucose, Bld: 130 mg/dL — ABNORMAL HIGH (ref 65–99)
Potassium: 4.3 mmol/L (ref 3.5–5.1)
Sodium: 134 mmol/L — ABNORMAL LOW (ref 135–145)

## 2014-09-19 LAB — CBC
HCT: 35.7 % (ref 35.0–47.0)
Hemoglobin: 11.7 g/dL — ABNORMAL LOW (ref 12.0–16.0)
MCH: 31 pg (ref 26.0–34.0)
MCHC: 32.8 g/dL (ref 32.0–36.0)
MCV: 94.7 fL (ref 80.0–100.0)
Platelets: 199 10*3/uL (ref 150–440)
RBC: 3.77 MIL/uL — AB (ref 3.80–5.20)
RDW: 14.9 % — ABNORMAL HIGH (ref 11.5–14.5)
WBC: 8.2 10*3/uL (ref 3.6–11.0)

## 2014-09-19 MED ORDER — SODIUM CHLORIDE 0.9 % IV BOLUS (SEPSIS)
1000.0000 mL | INTRAVENOUS | Status: AC
Start: 2014-09-19 — End: 2014-09-20

## 2014-09-19 MED ORDER — IOHEXOL 350 MG/ML SOLN
100.0000 mL | Freq: Once | INTRAVENOUS | Status: AC | PRN
  Administered 2014-09-19: 100 mL via INTRAVENOUS

## 2014-09-19 NOTE — Progress Notes (Signed)
[place] at [place] NAME: Virgie Kunda    MR#:  [id number]  DATE OF BIRTH:  Aug 09, 1945  SUBJECTIVE:  CHIEF COMPLAINT:   Chief Complaint  Patient presents with  . Shortness of Breath   Still very short of breath with exertion. Complains of nonspecific malaise.Marland Kitchen  REVIEW OF SYSTEMS:   Review of Systems  Constitutional: Negative for fever.  Respiratory: Positive for shortness of breath. Negative for cough and wheezing.   Cardiovascular: Negative for chest pain and palpitations.  Gastrointestinal: Negative for nausea, vomiting and abdominal pain.  Genitourinary: Negative for dysuria.    DRUG ALLERGIES:  No Known Allergies  VITALS:  Blood pressure 118/75, pulse 94, temperature 97.5 F (36.4 C), temperature source Oral, resp. rate 18, height 5\' 5"  (1.651 m), weight 79.334 kg (174 lb 14.4 oz), SpO2 95 %.  PHYSICAL EXAMINATION:  GENERAL:  69 y.o.-year-old patient lying in the bed with no acute distress.  EYES: Pupils equal, round, reactive to light and accommodation. No scleral icterus. Extraocular muscles intact.  HEENT: Head atraumatic, normocephalic. Oropharynx and nasopharynx clear.  NECK:  Supple, no jugular venous distention. No thyroid enlargement, no tenderness.  LUNGS: Expiratory wheezes, no crackles, rales, no use of accessory respiratory muscles, is breathing fairly rapidly. CARDIOVASCULAR: S1, S2 normal. No murmurs, rubs, or gallops.  ABDOMEN: Soft, nontender, nondistended. Bowel sounds present. No organomegaly or mass.  EXTREMITIES: No pedal edema, cyanosis, or clubbing.  NEUROLOGIC: Cranial nerves II through XII are intact. Muscle strength 5/5 in all extremities. Sensation intact. Gait not checked.  PSYCHIATRIC: The patient is alert and oriented x 3.  SKIN: No obvious rash, lesion, or ulcer.    LABORATORY PANEL:   CBC  Recent Labs Lab 09/19/14 0412  WBC 8.2  HGB 11.7*  HCT 35.7  PLT 199    ------------------------------------------------------------------------------------------------------------------  Chemistries   Recent Labs Lab 09/17/14 1258  09/19/14 0412  NA  --   < > 134*  K  --   < > 4.3  CL  --   < > 96*  CO2  --   < > 30  GLUCOSE  --   < > 130*  BUN  --   < > 25*  CREATININE  --   < > 0.88  CALCIUM  --   < > 9.3  MG 1.6*  --   --   < > = values in this interval not displayed. ------------------------------------------------------------------------------------------------------------------  Cardiac Enzymes  Recent Labs Lab 09/17/14 2145  TROPONINI 0.04*   ------------------------------------------------------------------------------------------------------------------  RADIOLOGY:  Dg Chest 2 View  09/19/2014   CLINICAL DATA:  Shortness of breath and CHF  EXAM: CHEST - 2 VIEW  COMPARISON:  09/17/2014  FINDINGS: Cardiac shadow remains enlarged. Improved aeration is noted within both lungs. Persistent density in the medial right lung base is seen. The vascular congestion seen previously has resolved. Pacemaker is again identified no focal infiltrate is seen. No bony abnormality noted.  IMPRESSION: Improved aeration and improved vascular congestion. Persistent right basilar changes are noted.   Electronically Signed   By: Inez Catalina M.D.   On: 09/19/2014 12:19    EKG:   Orders placed or performed during the hospital encounter of 09/17/14  . EKG 12-Lead  . EKG 12-Lead    ASSESSMENT AND PLAN:   Principal Problem:   Acute on chronic diastolic CHF (congestive heart failure), NYHA class 3 Active Problems:   CHF (congestive heart failure)   Severe mitral insufficiency  Hypothyroidism   Pulmonary hypertension   CAD (coronary artery disease)   Pacemaker  Problem #1 acute exacerbation of chronic diastolic heart failure  - Responding very well to diuresis. Chest x-ray much improved. Net -3 L - Appreciate cardiology consultation   problem #2  severe mitral insufficiency with pulmonary hypertension:  - Continue with diuresis  - She may be a good candidate for minoxidil   Problem #3 hypokalemia - Replaced  #4 coronary artery disease status post MI in the past - Cardiac enzymes negative. No chest pain.  #5 paroxysmal atrial fibrillation, third-degree heart block, pacemaker: - Intermittently tachycardic with a mostly paced rhythm - Continue Eliquis and metoprolol  #6 hypothyroidism - TSH normal. Continue Synthroid  #7 persistent tachypnea - Check CTA to be sure we are not missing a pulmonary embolus - Chest x-ray does show right hemidiaphragm tenting, CTA will help to evaluate this - He states that she has been discussing pulmonology referral with Dr. Nehemiah Massed for some time due to her severe pulmonary hypertension. I will consult Dr. Raul Del.  All the records are reviewed and case discussed with Care Management/Social Workerr. Management plans discussed with the patient, family and they are in agreement.  CODE STATUS: Full  TOTAL TIME TAKING CARE OF THIS PATIENT: 35 minutes.   POSSIBLE D/C IN 1 DAYS, DEPENDING ON CLINICAL CONDITION.   Myrtis Ser M.D on 09/19/2014 at 2:15 PM  Between 7am to 6pm - Pager - [phone]  After 6pm go to [email or web address] - password [email or web address] [place] [facility]  Office  [phone]  CC: Primary care physician; Ellamae Sia, MD

## 2014-09-19 NOTE — Progress Notes (Signed)
[facility] Cardiology  SUBJECTIVE: I'm feeling better   Filed Vitals:   09/18/14 1219 09/18/14 2024 09/19/14 0436 09/19/14 0752  BP: 111/66 112/72 122/74 104/60  Pulse: 101 99 91 94  Temp: 98.6 F (37 C) 96.5 F (35.8 C)    TempSrc: Oral Axillary    Resp: 19 20 20 18   Height:      Weight:   79.334 kg (174 lb 14.4 oz)   SpO2: 95% 100% 94%      Intake/Output Summary (Last 24 hours) at 09/19/14 0844 Last data filed at 09/19/14 0250  Gross per 24 hour  Intake    480 ml  Output   2325 ml  Net  -1845 ml      PHYSICAL EXAM  General: Well developed, well nourished, in no acute distress HEENT:  Normocephalic and atramatic Neck:  No JVD.  Lungs: Clear bilaterally to auscultation and percussion. Heart: HRRR . Normal S1 and S2 without gallops or murmurs.  Abdomen: Bowel sounds are positive, abdomen soft and non-tender  Msk:  Back normal, normal gait. Normal strength and tone for age. Extremities: No clubbing, cyanosis or edema.   Neuro: Alert and oriented X 3. Psych:  Good affect, responds appropriately   LABS: Basic Metabolic Panel:  Recent Labs  09/17/14 1258  09/18/14 0422 09/19/14 0412  NA  --   --  137 134*  K  --   --  3.2* 4.3  CL  --   --  97* 96*  CO2  --   --  32 30  GLUCOSE  --   --  128* 130*  BUN  --   --  14 25*  CREATININE  --   < > 0.87 0.88  CALCIUM  --   --  8.9 9.3  MG 1.6*  --   --   --   < > = values in this interval not displayed. Liver Function Tests: No results for input(s): AST, ALT, ALKPHOS, BILITOT, PROT, ALBUMIN in the last 72 hours. No results for input(s): LIPASE, AMYLASE in the last 72 hours. CBC:  Recent Labs  09/18/14 0422 09/19/14 0412  WBC 7.1 8.2  HGB 12.0 11.7*  HCT 35.5 35.7  MCV 94.6 94.7  PLT 184 199   Cardiac Enzymes:  Recent Labs  09/17/14 1258 09/17/14 1605 09/17/14 2145  TROPONINI 0.03 0.04* 0.04*   BNP: Invalid input(s): POCBNP D-Dimer: No results for input(s): DDIMER in the last 72 hours. Hemoglobin  A1C: No results for input(s): HGBA1C in the last 72 hours. Fasting Lipid Panel: No results for input(s): CHOL, HDL, LDLCALC, TRIG, CHOLHDL, LDLDIRECT in the last 72 hours. Thyroid Function Tests:  Recent Labs  09/17/14 0814  TSH 3.456   Anemia Panel: No results for input(s): VITAMINB12, FOLATE, FERRITIN, TIBC, IRON, RETICCTPCT in the last 72 hours.  No results found.   Echo   TELEMETRY: Normal sinus rhythm:  ASSESSMENT AND PLAN:  Active Problems:   CHF (congestive heart failure)   Severe mitral insufficiency   Pulmonary hypertension   Pacemaker   Hypothyroidism   CAD (coronary artery disease)    1. Acute on chronic diastolic congestive heart failure, improved after diuresis 2. Paroxysmal atrial fibrillation, chads Vasc 4, on apixaban for stroke prevention 3. Severe mitral regurgitation, not felt to be a candidate for adjuvant valve repair at this time 4. Pulmonary hypertension, multifactorial, secondary to mitral regurgitation  Recommendations  1. Continue current medications 2. Continue diuresis 3. Monitor daily weights 4. Low sodium diet  Isaias Cowman, MD, PhD, [facility] 09/19/2014 8:44 AM

## 2014-09-19 NOTE — Progress Notes (Addendum)
Room air. Paced. CT chest done. CXr showed improved vascular congestion. Pt has a cough and received tensles perles. Ambulating in the hallway and to the BR. Pt has reported some wheezing and got a neb tx. Sister came to visit. Pt has no further concerns at this time.

## 2014-09-19 NOTE — Progress Notes (Signed)
MD Volanda Napoleon was notified of low BP and was instructed to hold IV lasix, will follow

## 2014-09-20 LAB — CBC
HCT: 35 % (ref 35.0–47.0)
HEMOGLOBIN: 11.4 g/dL — AB (ref 12.0–16.0)
MCH: 30.9 pg (ref 26.0–34.0)
MCHC: 32.6 g/dL (ref 32.0–36.0)
MCV: 94.9 fL (ref 80.0–100.0)
PLATELETS: 207 10*3/uL (ref 150–440)
RBC: 3.68 MIL/uL — AB (ref 3.80–5.20)
RDW: 15 % — AB (ref 11.5–14.5)
WBC: 7 10*3/uL (ref 3.6–11.0)

## 2014-09-20 LAB — BASIC METABOLIC PANEL
ANION GAP: 9 (ref 5–15)
BUN: 21 mg/dL — ABNORMAL HIGH (ref 6–20)
CHLORIDE: 98 mmol/L — AB (ref 101–111)
CO2: 28 mmol/L (ref 22–32)
Calcium: 9.2 mg/dL (ref 8.9–10.3)
Creatinine, Ser: 0.87 mg/dL (ref 0.44–1.00)
GFR calc Af Amer: >60 mL/min (ref >60)
GFR calc non Af Amer: >60 mL/min (ref >60)
Glucose, Bld: 122 mg/dL — ABNORMAL HIGH (ref 65–99)
Potassium: 3.8 mmol/L (ref 3.5–5.1)
SODIUM: 135 mmol/L (ref 135–145)

## 2014-09-20 MED ORDER — LEVOFLOXACIN 750 MG PO TABS: 750.0000 mg | ORAL_TABLET | Freq: Every day | ORAL | Status: DC

## 2014-09-20 NOTE — Progress Notes (Signed)
Pt. Discharged to home, prn dose of tessalon pearles given per pt request. Discharge instructions and medication regimen reviewed at bedside with patient. Pt. verbalizes understanding of instructions and medication regimen. Patient assessment unchanged from this morning. TELE and IV discontinued per policy.

## 2014-09-20 NOTE — Discharge Instructions (Addendum)
Heart Failure Heart failure means your heart has trouble pumping blood. This makes it hard for your body to work well. Heart failure is usually a long-term (chronic) condition. You must take good care of yourself and follow your doctor's treatment plan. HOME CARE  Take your heart medicine as told by your doctor.  Do not stop taking medicine unless your doctor tells you to.  Do not skip any dose of medicine.  Refill your medicines before they run out.  Take other medicines only as told by your doctor or pharmacist.  Stay active if told by your doctor. The elderly and people with severe heart failure should talk with a doctor about physical activity.  Eat heart-healthy foods. Choose foods that are without trans fat and are low in saturated fat, cholesterol, and salt (sodium). This includes fresh or frozen fruits and vegetables, fish, lean meats, fat-free or low-fat dairy foods, whole grains, and high-fiber foods. Lentils and dried peas and beans (legumes) are also good choices.  Limit salt if told by your doctor.  Cook in a healthy way. Roast, grill, broil, bake, poach, steam, or stir-fry foods.  Limit fluids as told by your doctor.  Weigh yourself every morning. Do this after you pee (urinate) and before you eat breakfast. Write down your weight to give to your doctor.  Take your blood pressure and write it down if your doctor tells you to.  Ask your doctor how to check your pulse. Check your pulse as told.  Lose weight if told by your doctor.  Stop smoking or chewing tobacco. Do not use gum or patches that help you quit without your doctor's approval.  Schedule and go to doctor visits as told.  Nonpregnant women should have no more than 1 drink a day. Men should have no more than 2 drinks a day. Talk to your doctor about drinking alcohol.  Stop illegal drug use.  Stay current with shots (immunizations).  Manage your health conditions as told by your doctor.  Learn to  manage your stress.  Rest when you are tired.  If it is really hot outside:  Avoid intense activities.  Use air conditioning or fans, or get in a cooler place.  Avoid caffeine and alcohol.  Wear loose-fitting, lightweight, and light-colored clothing.  If it is really cold outside:  Avoid intense activities.  Layer your clothing.  Wear mittens or gloves, a hat, and a scarf when going outside.  Avoid alcohol.  Learn about heart failure and get support as needed.  Get help to maintain or improve your quality of life and your ability to care for yourself as needed. GET HELP IF:   You gain 03 lb/1.4 kg or more in 1 day or 05 lb/2.3 kg in a week.  You are more short of breath than usual.  You cannot do your normal activities.  You tire easily.  You cough more than normal, especially with activity.  You have any or more puffiness (swelling) in areas such as your hands, feet, ankles, or belly (abdomen).  You cannot sleep because it is hard to breathe.  You feel like your heart is beating fast (palpitations).  You get dizzy or light-headed when you stand up. GET HELP RIGHT AWAY IF:  1. You have trouble breathing. 2. There is a change in mental status, such as becoming less alert or not being able to focus. 3. You have chest pain or discomfort. 4. You faint. MAKE SURE YOU:   Understand these instructions.  Will watch your condition.  Will get help right away if you are not doing well or get worse. Document Released: 01/07/2008 Document Revised: 08/14/2013 Document Reviewed: 05/16/2012 [facility] Patient Information 2015 [place], [place]. This information is not intended to replace advice given to you by your health care provider. Make sure you discuss any questions you have with your health care provider. Incentive Spirometer An incentive spirometer is a tool that can help keep your lungs clear and active. This tool measures how well you are filling your lungs with  each breath. Taking long, deep breaths may help reverse or decrease the chance of developing breathing (pulmonary) problems (especially infection) following:  Surgery of the chest or abdomen.  Surgery if you have a history of smoking or a lung problem.  A long period of time when you are unable to move or be active. BEFORE THE PROCEDURE   If the spirometer includes an indicator to show your best effort, your nurse or respiratory therapist will set it to a desired goal.  If possible, sit up straight or lean slightly forward. Try not to slouch.  Hold the incentive spirometer in an upright position. INSTRUCTIONS FOR USE  5. Sit on the edge of your bed if possible, or sit up as far as you can in bed or on a chair. 6. Hold the incentive spirometer in an upright position. 7. Breathe out normally. 8. Place the mouthpiece in your mouth and seal your lips tightly around it. 9. Breathe in slowly and as deeply as possible, raising the piston or the ball toward the top of the column. 10. Hold your breath for 3-5 seconds or for as long as possible. Allow the piston or ball to fall to the bottom of the column. 11. Remove the mouthpiece from your mouth and breathe out normally. 12. Rest for a few seconds and repeat Steps 1 through 7 at least 10 times every 1-2 hours when you are awake. Take your time and take a few normal breaths between deep breaths. 13. The spirometer may include an indicator to show your best effort. Use the indicator as a goal to work toward during each repetition. 14. After each set of 10 deep breaths, practice coughing to be sure your lungs are clear. If you have an incision (the cut made at the time of surgery), support your incision when coughing by placing a pillow or rolled-up towels firmly against it. Once you are able to get out of bed, walk around indoors and cough well. You may stop using the incentive spirometer when instructed by your caregiver.  RISKS AND  COMPLICATIONS  Breathing too quickly may cause dizziness. At an extreme, this could cause you to pass out. Take your time so you do not get dizzy or light-headed.  If you are in pain, you may need to take or ask for pain medication before doing incentive spirometry. It is harder to take a deep breath if you are having pain. AFTER USE  Rest and breathe slowly and easily.  It can be helpful to keep a log of your progress. Your caregiver can provide you with a simple table to help with this. If you are using the spirometer at home, follow these instructions: [place] IF:   You are having difficultly using the spirometer.  You have trouble using the spirometer as often as instructed.  Your pain medication is not giving enough relief while using the spirometer.  You develop fever of 100.80F (38.1C) or higher.  SEEK IMMEDIATE MEDICAL CARE IF:   You cough up bloody sputum that had not been present before.  You develop fever of 102F (38.9C) or greater.  You develop worsening pain at or near the incision site. MAKE SURE YOU:   Understand these instructions.  Will watch your condition.  Will get help right away if you are not doing well or get worse. Document Released: 08/10/2006 Document Revised: 08/14/2013 Document Reviewed: 10/11/2006 [facility] Patient Information 2015 [place], [place]. This information is not intended to replace advice given to you by your health care provider. Make sure you discuss any questions you have with your health care provider.

## 2014-09-20 NOTE — Discharge Summary (Signed)
[facility] [place] [facility] [place] NAME: Christie Carpenter    MR#:  [id number]  DATE OF BIRTH:  1945-11-02  DATE OF ADMISSION:  09/17/2014 ADMITTING PHYSICIAN: Aldean Jewett, MD  DATE OF DISCHARGE: 09/20/2014  PRIMARY CARE PHYSICIAN: Ellamae Sia, MD    ADMISSION DIAGNOSIS:  Acute on chronic diastolic heart failure [S28.76] Hypokalemia [E87.6] Acute dyspnea [R06.00]  DISCHARGE DIAGNOSIS:  Principal Problem:   Acute on chronic diastolic CHF (congestive heart failure), NYHA class 3 Active Problems:   CHF (congestive heart failure)   Severe mitral insufficiency   Hypothyroidism   Pulmonary hypertension   CAD (coronary artery disease)   Pacemaker   SECONDARY DIAGNOSIS:   Past Medical History  Diagnosis Date  . CHF (congestive heart failure)   . Pulmonary hypertension   . Mitral valve problem   . Coronary artery disease   . Presence of permanent cardiac pacemaker   . Heart murmur   . Shortness of breath dyspnea   . Hypothyroidism   . Diabetes mellitus without complication   . Depression   . 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 For further details please refer the H&P.  1. Acute exacerbation of chronic dissection heart failure: Patient was followed by cardiology while in the hospital. Patient was aggressively diuresed with Lasix. Patient is stable and at her baseline. Patient has no crackles on lung exam at discharge. Patient is not requiring oxygen. Patient will follow-up with her primary cardiologist.  2. Severe mitral insufficiency with pulmonary hypertension: Patient will follow-up with her cardiologist as an outpatient. Patient may be a good candidate for minoxidil.  3. Hypokalemia: This was replaced. 4.  Coronary artery disease: [name] had no signs or symptoms of acute chronic syndrome during this hospital physician. Patient will resume her outpatient medications.  5. Paroxsmal atrial fibrillation and 3 of third-degree heart block: Patient has a pacemaker. Patient will continue on Eliquis and metoprolol.  6. Pneumonia: Due to persistent tachypnea patient underwent a CTA of the lungs. This was negative. Pulmonary emboli however it did show possible pneumonia in the right side with right lower lobe collapse/atelectasis. Patient will need a follow-up with pulmonary as an outpatient. Patient needs a repeat CT scan in 4 weeks. I called Dr. Raul Del who is a pulmonologist and discussed the case with him. Patient will have follow-up with pulmonary within 1 week. I am recommending an incentive spirometer. I have also started the patient on Levaquin. I suspect the pneumonia was a community acquired pneumonia and initially not seen on chest x-ray due to her acute on chronic diastolic heart failure.   DISCHARGE CONDITIONS AND DIET:  Patient will be discharged home in stable health.  CONSULTS OBTAINED:  Treatment Team:  Erby Pian, MD  DRUG ALLERGIES:  No Known Allergies  DISCHARGE MEDICATIONS:   Current Discharge Medication List    START taking these medications   Details  levofloxacin (LEVAQUIN) 750 MG tablet Take 1 tablet (750 mg total) by mouth daily. Qty: 7 tablet, Refills: 0      CONTINUE these medications which have NOT CHANGED   Details  albuterol (PROVENTIL HFA;VENTOLIN HFA) 108 (90 BASE) MCG/ACT inhaler Inhale 2 puffs into the lungs every 6 (six) hours as needed  for wheezing or shortness of breath.    apixaban (ELIQUIS) 5 MG TABS tablet Take 5 mg by mouth 2 (two) times daily.    cetirizine (ZYRTEC) 10 MG tablet Take 10 mg by mouth daily as needed for allergies.    furosemide (LASIX) 40 MG tablet Take 40 mg by mouth daily.    levothyroxine (SYNTHROID, LEVOTHROID) 75 MCG tablet  Take 75 mcg by mouth daily before breakfast.    metoprolol succinate (TOPROL-XL) 50 MG 24 hr tablet Take 50 mg by mouth daily. Take with or immediately following a meal.              Today   CHIEF COMPLAINT:  Patient is feeling well this morning. No acute issues overnight.   VITAL SIGNS:  Blood pressure 122/81, pulse 89, temperature 97.9 F (36.6 C), temperature source Oral, resp. rate 18, height 5\' 5"  (1.651 m), weight 79.017 kg (174 lb 3.2 oz), SpO2 96 %.   REVIEW OF SYSTEMS:  Review of Systems  Constitutional: Negative for fever and chills.  Respiratory: Positive for cough. Negative for sputum production, shortness of breath and wheezing.   Cardiovascular: Negative for chest pain.  Gastrointestinal: Negative for vomiting and abdominal pain.  Genitourinary: Negative for urgency and frequency.  Neurological: Negative for tingling and headaches.     PHYSICAL EXAMINATION:  GENERAL:  69 y.o.-year-old patient lying in the bed with no acute distress.  NECK:  Supple, no jugular venous distention. No thyroid enlargement, no tenderness.  LUNGS: Normal breath sounds bilaterally, no wheezing, rales,rhonchi  No use of accessory muscles of respiration.  CARDIOVASCULAR: S1, S2 normal. 2/6 diastolic murmur heard best at the right sternal border ABDOMEN: Soft, non-tender, non-distended. Bowel sounds present. No organomegaly or mass.  EXTREMITIES: No pedal edema, cyanosis, or clubbing.  PSYCHIATRIC: The patient is alert and oriented x 3.  SKIN: No obvious rash, lesion, or ulcer.   DATA REVIEW:   CBC  Recent Labs Lab 09/20/14 0434  WBC 7.0  HGB 11.4*  HCT 35.0  PLT 207    Chemistries   Recent Labs Lab 09/17/14 1258  09/20/14 0434  NA  --   < > 135  K  --   < > 3.8  CL  --   < > 98*  CO2  --   < > 28  GLUCOSE  --   < > 122*  BUN  --   < > 21*  CREATININE  --   < > 0.87  CALCIUM  --   < > 9.2  MG 1.6*  --   --   < > = values in this interval not  displayed.  Cardiac Enzymes  Recent Labs Lab 09/17/14 1258 09/17/14 1605 09/17/14 2145  TROPONINI 0.03 0.04* 0.04*    Microbiology Results  @[email or web address] @  RADIOLOGY:  Dg Chest 2 View  09/19/2014    IMPRESSION: Improved aeration and improved vascular congestion. Persistent right basilar changes are noted.   Electronically Signed   By: Inez Catalina M.D.   On: 09/19/2014 12:19   Ct Angio Chest Pe W/cm &/or Wo Cm  09/19/2014     IMPRESSION: 1. No evidence of pulmonary embolism. 2. Similar findings of cardiomegaly and pulmonary edema with small to moderate size bilateral effusions, right greater than left, and partial atelectasis/collapse of the right lower lobe. 3. Mild thickening of the pulmonary airways, nonspecific though could be seen in the setting of airways disease/bronchitis. 4. Grossly unchanged asymmetric left apical pleural parenchymal thickening with dominant  slightly spiculated nodular opacity within the left lung apex measuring approximately 1.7 cm in diameter. This finding is associated with unchanged shotty left hilar lymph nodes. While potentially representative of asymmetric atelectasis and/or scarring and associated reactive hilar lymph nodes, an underlying discrete pulmonary nodule is not excluded on the basis of this examination. As such, a follow-up contrast-enhanced chest CT in 4-6 weeks is recommended to ensure stability and/or resolution.   Electronically Signed   By: Sandi Mariscal M.D.   On: 09/19/2014 15:24      Management plans discussed with the patient and she is in agreement. Stable for discharge home  Patient should follow up with pulmonary in 2 weeks and PCP in one week  CODE STATUS:     Code Status Orders        Start     Ordered   09/17/14 1114  Full code   Continuous     09/17/14 1114      TOTAL TIME TAKING CARE OF THIS PATIENT: 35 minutes.    Venesa Semidey M.D on 09/20/2014 at 11:53 AM  Between 7am to 6pm - Pager - [phone] After 6pm go to  [email or web address] - password [email or web address] [facility]  Office  [phone]  CC: Primary care physician; Ellamae Sia, MD

## 2014-10-24 ENCOUNTER — Ambulatory Visit: Payer: Medicare Other | Admitting: Family

## 2014-11-12 ENCOUNTER — Other Ambulatory Visit: Payer: Self-pay | Admitting: Internal Medicine

## 2014-11-12 ENCOUNTER — Ambulatory Visit
Admission: RE | Admit: 2014-11-12 | Discharge: 2014-11-12 | Disposition: A | Discharge status: Home / Self Care | Payer: Medicare Other | Source: Ambulatory Visit | Attending: Internal Medicine | Admitting: Internal Medicine

## 2014-11-12 DIAGNOSIS — M25572 Pain in left ankle and joints of left foot: Secondary | ICD-10-CM | POA: Diagnosis present

## 2015-01-07 ENCOUNTER — Encounter: Payer: Medicare Other | Attending: Internal Medicine | Admitting: *Deleted

## 2015-01-07 VITALS — BP 112/80 | HR 96 | Ht 66.0 in | Wt 165.0 lb

## 2015-01-07 DIAGNOSIS — Z9889 Other specified postprocedural states: Secondary | ICD-10-CM | POA: Insufficient documentation

## 2015-01-07 NOTE — Progress Notes (Signed)
Cardiac Individual Treatment Plan  Patient Details  Name: Christie Carpenter MRN: [id number] Date of Birth: [birth year] Referring Provider:  Phylis Bougie  Initial Encounter Date: Date: 01/07/15  Visit Diagnosis: S/P MVR (mitral valve repair)  Patient's Home Medications on Admission:  Current outpatient prescriptions:  .  albuterol (PROVENTIL HFA;VENTOLIN HFA) 108 (90 BASE) MCG/ACT inhaler, Inhale 2 puffs into the lungs every 6 (six) hours as needed for wheezing or shortness of breath., Disp: , Rfl:  .  apixaban (ELIQUIS) 5 MG TABS tablet, Take 5 mg by mouth 2 (two) times daily., Disp: , Rfl:  .  cetirizine (ZYRTEC) 10 MG tablet, Take 10 mg by mouth daily as needed for allergies., Disp: , Rfl:  .  furosemide (LASIX) 40 MG tablet, Take 40 mg by mouth daily., Disp: , Rfl:  .  levofloxacin (LEVAQUIN) 750 MG tablet, Take 1 tablet (750 mg total) by mouth daily., Disp: 7 tablet, Rfl: 0 .  levothyroxine (SYNTHROID, LEVOTHROID) 75 MCG tablet, Take 75 mcg by mouth daily before breakfast., Disp: , Rfl:  .  metoprolol succinate (TOPROL-XL) 50 MG 24 hr tablet, Take 50 mg by mouth daily. Take with or immediately following a meal., Disp: , Rfl:   Past Medical History: Past Medical History  Diagnosis Date  . CHF (congestive heart failure)   . Pulmonary hypertension   . Mitral valve problem   . Coronary artery disease   . Presence of permanent cardiac pacemaker   . Heart murmur   . Shortness of breath dyspnea   . Hypothyroidism   . Diabetes mellitus without complication   . Depression   . 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 Former Smoker  Smokeless tobacco  . Never Used    Labs: Recent [employer] for ITP Cardiac and Pulmonary Rehab Latest Ref Rng 10/20/2011   Cholestrol 0-200 mg/dL 185   LDLCALC 0-100 mg/dL 104(H)   HDL 40-60 mg/dL 43   Trlycerides 0-200 mg/dL 192   Hemoglobin A1c 4.2-6.3 % 6.8(H)       Exercise Target  Goals: Date: 01/07/15  Exercise Program Goal: Individual exercise prescription set with THRR, safety & activity barriers. Participant demonstrates ability to understand and report RPE using BORG scale, to self-measure pulse accurately, and to acknowledge the importance of the exercise prescription.  Exercise Prescription Goal: Starting with aerobic activity 30 plus minutes a day, 3 days per week for initial exercise prescription. Provide home exercise prescription and guidelines that participant acknowledges understanding prior to discharge.  Activity Barriers & Risk Stratification:   6 Minute Walk:     6 Minute Walk      01/07/15 1514       6 Minute Walk   Phase Initial     Distance 1010 feet     Walk Time 6 minutes     Resting HR 96 bpm     Resting BP 112/80 mmHg     Max Ex. HR 106 bpm     Max Ex. BP 118/60 mmHg     RPE 15     Symptoms Yes (comment)     Comments Shortness of breath        Initial Exercise Prescription:     Initial Exercise Prescription - 01/07/15 1500    Date of Initial Exercise Prescription   Date 01/07/15   Treadmill   MPH 1.5   Grade 0   Minutes 15  Bike   Level 0.8   Minutes 15   Recumbant Bike   Level 2   Watts 20   Minutes 15   NuStep   Level 3   Watts 30   Minutes 15   Arm Ergometer   Level 1   Watts 8   Minutes 15   Arm/Foot Ergometer   Level 1   Watts 10   Minutes 15   Cybex   Level 1   RPM 40   Minutes 15   Recumbant Elliptical   Level 1   Watts 20   Minutes 15   Elliptical   Level 1   Speed 2   Minutes 5   REL-XR   Level 2   Watts 20   Minutes 15   Prescription Details   Frequency (times per week) 3   Duration Progress to 30 minutes of continuous aerobic without signs/symptoms of physical distress   Intensity   THRR REST +  30   Ratings of Perceived Exertion 11-15   Progression Continue progressive overload as per policy without signs/symptoms or physical distress.   Resistance Training   Training  Prescription Yes   Weight 2   Reps 10-12      Exercise Prescription Changes:   Discharge Exercise Prescription (Final Exercise Prescription Changes):   Nutrition:  Target Goals: Understanding of nutrition guidelines, daily intake of sodium 1500mg , cholesterol 200mg , calories 30% from fat and 7% or less from saturated fats, daily to have 5 or more servings of fruits and vegetables.  Biometrics:     Pre Biometrics - 01/07/15 1518    Pre Biometrics   Height 5\' 6"  (1.676 m)   Weight 165 lb (74.844 kg)   Waist Circumference 38 inches   Hip Circumference 42 inches   Waist to Hip Ratio 0.9 %   BMI (Calculated) 26.7       Nutrition Therapy Plan and Nutrition Goals:   Nutrition Discharge: Rate Your Plate Scores:   Nutrition Goals Re-Evaluation:   Psychosocial: Target Goals: Acknowledge presence or absence of depression, maximize coping skills, provide positive support system. Participant is able to verbalize types and ability to use techniques and skills needed for reducing stress and depression.  Initial Review & Psychosocial Screening:     Initial Psych Review & Screening - 01/07/15 1537    Initial Review   Current issues with Current Depression;Current Stress Concerns   Family Dynamics   Good Support System? Yes   Screening Interventions   Interventions Encouraged to exercise;Program counselor consult;Other (comment)   Comments Christie Carpenter stated she is depressed and has "tried medicine and counseling before and it didn't help. Anyone would be depressed if they had gone through what I have in the past 10 years. I took care of my husband for 5 years who was paralzyed on one side with a stroke. My daughter also had the same type of cancer 6 years ago and we moved up to [facility] to help take care of her 3 /4 children while she was receiving treatments. 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 I encouraged her to exercise and will consult our Cardiac REhab  mental health counselor.       Quality of Life Scores:   PHQ-9:     Recent Review Flowsheet Data    Depression screen [facility] 2/9 01/07/2015   Decreased Interest 3   Down, Depressed, Hopeless 2   PHQ - 2 Score 5   Altered sleeping  1   Tired, decreased energy 2   Change in appetite 3   Feeling bad or failure about yourself  1   Trouble concentrating 1   Moving slowly or fidgety/restless 1   Suicidal thoughts 1   PHQ-9 Score 15   Difficult doing work/chores Somewhat difficult      Psychosocial Evaluation and Intervention:   Psychosocial Re-Evaluation:   Vocational Rehabilitation: Provide vocational rehab assistance to qualifying candidates.   Vocational Rehab Evaluation & Intervention:     Vocational Rehab - 01/07/15 1535    Initial Vocational Rehab Evaluation & Intervention   Assessment shows need for Vocational Rehabilitation No      Education: Education Goals: Education classes will be provided on a weekly basis, covering required topics. Participant will state understanding/return demonstration of topics presented.  Learning Barriers/Preferences:     Learning Barriers/Preferences - 01/07/15 1535    Learning Barriers/Preferences   Learning Barriers None   Learning Preferences None      Education Topics: General Nutrition Guidelines/Fats and Fiber: -Group instruction provided by verbal, written material, models and posters to present the general guidelines for heart healthy nutrition. Gives an explanation and review of dietary fats and fiber.   Controlling Sodium/Reading Food Labels: -Group verbal and written material supporting the discussion of sodium use in heart healthy nutrition. Review and explanation with models, verbal and written materials for utilization of the food label.   Exercise Physiology & Risk Factors: - Group verbal and written instruction with models to review the exercise physiology of the cardiovascular system and associated  critical values. Details cardiovascular disease risk factors and the goals associated with each risk factor.   Aerobic Exercise & Resistance Training: - Gives group verbal and written discussion on the health impact of inactivity. On the components of aerobic and resistive training programs and the benefits of this training and how to safely progress through these programs.   Flexibility, Balance, General Exercise Guidelines: - Provides group verbal and written instruction on the benefits of flexibility and balance training programs. Provides general exercise guidelines with specific guidelines to those with heart or lung disease. Demonstration and skill practice provided.   Stress Management: - Provides group verbal and written instruction about the health risks of elevated stress, cause of high stress, and healthy ways to reduce stress.   Depression: - Provides group verbal and written instruction on the correlation between heart/lung disease and depressed mood, treatment options, and the stigmas associated with seeking treatment.   Anatomy & Physiology of the Heart: - Group verbal and written instruction and models provide basic cardiac anatomy and physiology, with the coronary electrical and arterial systems. Review of: AMI, Angina, Valve disease, Heart Failure, Cardiac Arrhythmia, Pacemakers, and the ICD.   Cardiac Procedures: - Group verbal and written instruction and models to describe the testing methods done to diagnose heart disease. Reviews the outcomes of the test results. Describes the treatment choices: Medical Management, Angioplasty, or Coronary Bypass Surgery.   Cardiac Medications: - Group verbal and written instruction to review commonly prescribed medications for heart disease. Reviews the medication, class of the drug, and side effects. Includes the steps to properly store meds and maintain the prescription regimen.   Go Sex-Intimacy & Heart Disease, Get SMART -  Goal Setting: - Group verbal and written instruction through game format to discuss heart disease and the return to sexual intimacy. Provides group verbal and written material to discuss and apply goal setting through the application of the S.M.A.R.T. Method.  Other Matters of the Heart: - Provides group verbal, written materials and models to describe Heart Failure, Angina, Valve Disease, and Diabetes in the realm of heart disease. Includes description of the disease process and treatment options available to the cardiac patient.   Exercise & Equipment Safety: - Individual verbal instruction and demonstration of equipment use and safety with use of the equipment.          Cardiac Rehab from 01/07/2015 in [facility] Cardiac Rehab   Date  01/07/15   Educator  C. [name],RN   Instruction Review Code  1- partially meets, needs review/practice      Infection Prevention: - Provides verbal and written material to individual with discussion of infection control including proper hand washing and proper equipment cleaning during exercise session.      Cardiac Rehab from 01/07/2015 in [facility] Cardiac Rehab   Date  01/07/15   Educator  kin,RN   Instruction Review Code  2- meets goals/outcomes      Falls Prevention: - Provides verbal and written material to individual with discussion of falls prevention and safety.      Cardiac Rehab from 01/07/2015 in [facility] Cardiac Rehab   Date  01/07/15   Educator  C. Kano Heckmann   Instruction Review Code  2- meets goals/outcomes      Diabetes: - Individual verbal and written instruction to review signs/symptoms of diabetes, desired ranges of glucose level fasting, after meals and with exercise. Advice that pre and post exercise glucose checks will be done for 3 sessions at entry of program.    Knowledge Questionnaire Score:   Personal Goals and Risk Factors at Admission:     Personal Goals and Risk Factors at Admission - 01/07/15 1804    Personal Goals and  Risk Factors on Admission   Increase Aerobic Exercise and Physical Activity Yes   Intervention While in program, learn and follow the exercise prescription taught. Start at a low level workload and increase workload after able to maintain previous level for 30 minutes. Increase time before increasing intensity.   Take Less Medication Yes   Intervention Learn your risk factors and begin the lifestyle modifications for risk factor control during your time in the program.   Diabetes No   Hypertension Yes   Goal Participant will see blood pressure controlled within the values of 140/55mm/Hg or within value directed by their physician.   Intervention Provide nutrition & aerobic exercise along with prescribed medications to achieve BP 140/90 or less.   Stress Yes   Goal To meet with psychosocial counselor for stress and relaxation information and guidance. To state understanding of performing relaxation techniques and or identifying personal stressors.   Intervention Provide education on types of stress, identifiying stressors, and ways to cope with stress. Provide demonstration and active practice of relaxation techniques.      Personal Goals and Risk Factors Review:    Personal Goals Discharge (Final Personal Goals and Risk Factors Review):     Comments: REady to start Cardiac Rehab to get her strength back since her valve surgery. Christie Carpenter's sister is encouraging her to begin Cardiac Rehab also.

## 2015-01-08 NOTE — Patient Instructions (Signed)
Patient Instructions  Patient Details  Name: Christie Carpenter MRN: [id number] Date of Birth: [birth year] Referring Provider:  Phylis Bougie  Below are the personal goals you chose as well as exercise and nutrition goals. Our goal is to help you keep on track towards obtaining and maintaining your goals. We will be discussing your progress on these goals with you throughout the program.  Initial Exercise Prescription:     Initial Exercise Prescription - 01/07/15 1500    Date of Initial Exercise Prescription   Date 01/07/15   Treadmill   MPH 1.5   Grade 0   Minutes 15   Bike   Level 0.8   Minutes 15   Recumbant Bike   Level 2   Watts 20   Minutes 15   NuStep   Level 3   Watts 30   Minutes 15   Arm Ergometer   Level 1   Watts 8   Minutes 15   Arm/Foot Ergometer   Level 1   Watts 10   Minutes 15   Cybex   Level 1   RPM 40   Minutes 15   Recumbant Elliptical   Level 1   Watts 20   Minutes 15   Elliptical   Level 1   Speed 2   Minutes 5   REL-XR   Level 2   Watts 20   Minutes 15   Prescription Details   Frequency (times per week) 3   Duration Progress to 30 minutes of continuous aerobic without signs/symptoms of physical distress   Intensity   THRR REST +  30   Ratings of Perceived Exertion 11-15   Progression Continue progressive overload as per policy without signs/symptoms or physical distress.   Resistance Training   Training Prescription Yes   Weight 2   Reps 10-12      Exercise Goals: Frequency: Be able to perform aerobic exercise three times per week working toward 3-5 days per week.  Intensity: Work with a perceived exertion of 11 (fairly light) - 15 (hard) as tolerated. Follow your new exercise prescription and watch for changes in prescription as you progress with the program. Changes will be reviewed with you when they are made.  Duration: You should be able to do 30 minutes of continuous aerobic exercise in addition to a 5 minute warm-up and  a 5 minute cool-down routine.  Nutrition Goals: Your personal nutrition goals will be established when you do your nutrition analysis with the dietician.  The following are nutrition guidelines to follow: Cholesterol < 200mg /day Sodium < 1500mg /day Fiber: Women over 50 yrs - 21 grams per day  Personal Goals:     Personal Goals and Risk Factors at Admission - 01/07/15 1804    Personal Goals and Risk Factors on Admission   Increase Aerobic Exercise and Physical Activity Yes   Intervention While in program, learn and follow the exercise prescription taught. Start at a low level workload and increase workload after able to maintain previous level for 30 minutes. Increase time before increasing intensity.   Take Less Medication Yes   Intervention Learn your risk factors and begin the lifestyle modifications for risk factor control during your time in the program.   Diabetes No   Hypertension Yes   Goal Participant will see blood pressure controlled within the values of 140/29mm/Hg or within value directed by their physician.   Intervention Provide nutrition & aerobic exercise along with prescribed medications to achieve BP 140/90 or less.  Stress Yes   Goal To meet with psychosocial counselor for stress and relaxation information and guidance. To state understanding of performing relaxation techniques and or identifying personal stressors.   Intervention Provide education on types of stress, identifiying stressors, and ways to cope with stress. Provide demonstration and active practice of relaxation techniques.      Tobacco Use Initial Evaluation: History  Smoking status  . Former Smoker  Smokeless tobacco  . Never Used    Copy of goals given to participant.

## 2015-01-11 ENCOUNTER — Telehealth: Payer: Self-pay | Admitting: *Deleted

## 2015-01-11 NOTE — Progress Notes (Signed)
Cardiac Individual Treatment Plan  Patient Details  Name: Christie Carpenter MRN: [id number] Date of Birth: Apr 07, 1946 Referring Christie Carpenter:  Christie Bougie, MD  Initial Encounter Date: Date: 01/07/15  Visit Diagnosis: S/P MVR (mitral valve repair) - Plan: CARDIAC REHAB 100 DAY REVIEW  Patient's Home Medications on Admission:  Current outpatient prescriptions:  .  senna-docusate (SENOKOT-S) 8.6-50 MG tablet, Take by mouth., Disp: , Rfl:  .  albuterol (PROVENTIL HFA;VENTOLIN HFA) 108 (90 BASE) MCG/ACT inhaler, Inhale 2 puffs into the lungs every 6 (six) hours as needed for wheezing or shortness of breath., Disp: , Rfl:  .  apixaban (ELIQUIS) 5 MG TABS tablet, Take 5 mg by mouth 2 (two) times daily., Disp: , Rfl:  .  cetirizine (ZYRTEC) 10 MG tablet, Take 10 mg by mouth daily as needed for allergies., Disp: , Rfl:  .  furosemide (LASIX) 40 MG tablet, Take 40 mg by mouth daily., Disp: , Rfl:  .  levofloxacin (LEVAQUIN) 750 MG tablet, Take 1 tablet (750 mg total) by mouth daily., Disp: 7 tablet, Rfl: 0 .  levothyroxine (SYNTHROID, LEVOTHROID) 75 MCG tablet, Take 75 mcg by mouth daily before breakfast., Disp: , Rfl:  .  metoprolol succinate (TOPROL-XL) 50 MG 24 hr tablet, Take 50 mg by mouth daily. Take with or immediately following a meal., Disp: , Rfl:   Past Medical History: Past Medical History  Diagnosis Date  . CHF (congestive heart failure)   . Pulmonary hypertension   . Mitral valve problem   . Coronary artery disease   . Presence of permanent cardiac pacemaker   . Heart murmur   . Shortness of breath dyspnea   . Hypothyroidism   . Diabetes mellitus without complication   . Depression   . 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 Former Smoker  Smokeless tobacco  . 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 Participant demonstrates ability to understand and report RPE using BORG scale, to self-measure pulse accurately, and to acknowledge the importance of the exercise prescription.  Exercise Prescription Goal: Starting with aerobic activity 30 plus minutes a day, 3 days per week for initial exercise prescription. Provide home exercise prescription and guidelines that participant acknowledges understanding prior to discharge.  Activity Barriers & Risk Stratification:     Activity Barriers & Risk Stratification - 01/08/15 1508    Activity Barriers & Risk Stratification   Activity Barriers None   Risk Stratification High      6 Minute Walk:     6 Minute Walk      01/07/15 1514       6 Minute Walk   Phase Initial     Distance 1010 feet     Walk Time 6 minutes     Resting HR 96 bpm     Resting BP 112/80 mmHg     Max Ex. HR 106 bpm     Max Ex. BP 118/60 mmHg     RPE 15     Symptoms Yes (comment)  Comments Shortness of breath        Initial Exercise Prescription:     Initial Exercise Prescription - 01/07/15 1500    Date of Initial Exercise Prescription   Date 01/07/15   Treadmill   MPH 1.5   Grade 0   Minutes 15   Bike   Level 0.8   Minutes 15   Recumbant Bike   Level 2   Watts 20   Minutes 15   NuStep   Level 3   Watts 30   Minutes 15   Arm Ergometer   Level 1   Watts 8   Minutes 15   Arm/Foot Ergometer   Level 1   Watts 10   Minutes 15   Cybex   Level 1   RPM 40   Minutes 15   Recumbant Elliptical   Level 1   Watts 20   Minutes 15   Elliptical   Level 1   Speed 2   Minutes 5   REL-XR   Level 2   Watts 20   Minutes 15   Prescription Details   Frequency (times per week) 3   Duration Progress to 30  minutes of continuous aerobic without signs/symptoms of physical distress   Intensity   THRR REST +  30   Ratings of Perceived Exertion 11-15   Progression Continue progressive overload as per policy without signs/symptoms or physical distress.   Resistance Training   Training Prescription Yes   Weight 2   Reps 10-12      Exercise Prescription Changes:   Discharge Exercise Prescription (Final Exercise Prescription Changes):   Nutrition:  Target Goals: Understanding of nutrition guidelines, daily intake of sodium 1500mg , cholesterol 200mg , calories 30% from fat and 7% or less from saturated fats, daily to have 5 or more servings of fruits and vegetables.  Biometrics:     Pre Biometrics - 01/07/15 1518    Pre Biometrics   Height 5\' 6"  (1.676 m)   Weight 165 lb (74.844 kg)   Waist Circumference 38 inches   Hip Circumference 42 inches   Waist to Hip Ratio 0.9 %   BMI (Calculated) 26.7       Nutrition Therapy Plan and Nutrition Goals:     Nutrition Therapy & Goals - 01/08/15 1512    Nutrition Therapy   Drug/Food Interactions Statins/Certain Fruits   Intervention Plan   Intervention Using nutrition plan and personal goals to gain a healthy nutrition lifestyle. Add exercise as prescribed.      Nutrition Discharge: Rate Your Plate Scores:   Nutrition Goals Re-Evaluation:   Psychosocial: Target Goals: Acknowledge presence or absence of depression, maximize coping skills, provide positive support system. Participant is able to verbalize types and ability to use techniques and skills needed for reducing stress and depression.  Initial Review & Psychosocial Screening:     Initial Psych Review & Screening - 01/07/15 1537    Initial Review   Current issues with Current Depression;Current Stress Concerns   Family Dynamics   Good Support System? Yes   Screening Interventions   Interventions Encouraged to exercise;Program counselor consult;Other (comment)   Comments  Christie Carpenter stated she is depressed and has "tried medicine and counseling before and it didn't help. Anyone would be depressed if they had gone through what I have in the past 10 years. I took care of my husband for 5 years who was paralzyed on one side with a stroke. My daughter also had the same type  of cancer 6 years ago and we moved up to [facility] to help take care of her 3 /4 children while she was receiving treatments. 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 I encouraged her to exercise and will consult our Cardiac REhab mental health counselor.       Quality of Life Scores:     Quality of Life - 01/07/15 1513    Quality of Life Scores   Health/Function Pre 17.14 %   Socioeconomic Pre 19.83 %   Psych/Spiritual Pre 15.43 %   Family Pre 25.5 %   GLOBAL Pre 18.35 %      PHQ-9:     Recent Review Flowsheet Data    Depression screen [facility] 2/9 01/07/2015   Decreased Interest 3   Down, Depressed, Hopeless 2   PHQ - 2 Score 5   Altered sleeping 1   Tired, decreased energy 2   Change in appetite 3   Feeling bad or failure about yourself  1   Trouble concentrating 1   Moving slowly or fidgety/restless 1   Suicidal thoughts 1   PHQ-9 Score 15   Difficult doing work/chores Somewhat difficult      Psychosocial Evaluation and Intervention:   Psychosocial Re-Evaluation:   Vocational Rehabilitation: Provide vocational rehab assistance to qualifying candidates.   Vocational Rehab Evaluation & Intervention:     Vocational Rehab - 01/07/15 1535    Initial Vocational Rehab Evaluation & Intervention   Assessment shows need for Vocational Rehabilitation No      Education: Education Goals: Education classes will be provided on a weekly basis, covering required topics. Participant will state understanding/return demonstration of topics presented.  Learning Barriers/Preferences:     Learning Barriers/Preferences - 01/08/15 1509    Learning Barriers/Preferences    Learning Barriers None   Learning Preferences None      Education Topics: General Nutrition Guidelines/Fats and Fiber: -Group instruction provided by verbal, written material, models and posters to present the general guidelines for heart healthy nutrition. Gives an explanation and review of dietary fats and fiber.   Controlling Sodium/Reading Food Labels: -Group verbal and written material supporting the discussion of sodium use in heart healthy nutrition. Review and explanation with models, verbal and written materials for utilization of the food label.   Exercise Physiology & Risk Factors: - Group verbal and written instruction with models to review the exercise physiology of the cardiovascular system and associated critical values. Details cardiovascular disease risk factors and the goals associated with each risk factor.   Aerobic Exercise & Resistance Training: - Gives group verbal and written discussion on the health impact of inactivity. On the components of aerobic and resistive training programs and the benefits of this training and how to safely progress through these programs.   Flexibility, Balance, General Exercise Guidelines: - Provides group verbal and written instruction on the benefits of flexibility and balance training programs. Provides general exercise guidelines with specific guidelines to those with heart or lung disease. Demonstration and skill practice provided.   Stress Management: - Provides group verbal and written instruction about the health risks of elevated stress, cause of high stress, and healthy ways to reduce stress.   Depression: - Provides group verbal and written instruction on the correlation between heart/lung disease and depressed mood, treatment options, and the stigmas associated with seeking treatment.   Anatomy & Physiology of the Heart: - Group verbal and written instruction and models provide basic cardiac anatomy and physiology,  with  the coronary electrical and arterial systems. Review of: AMI, Angina, Valve disease, Heart Failure, Cardiac Arrhythmia, Pacemakers, and the ICD.   Cardiac Procedures: - Group verbal and written instruction and models to describe the testing methods done to diagnose heart disease. Reviews the outcomes of the test results. Describes the treatment choices: Medical Management, Angioplasty, or Coronary Bypass Surgery.   Cardiac Medications: - Group verbal and written instruction to review commonly prescribed medications for heart disease. Reviews the medication, class of the drug, and side effects. Includes the steps to properly store meds and maintain the prescription regimen.   Go Sex-Intimacy & Heart Disease, Get SMART - Goal Setting: - Group verbal and written instruction through game format to discuss heart disease and the return to sexual intimacy. Provides group verbal and written material to discuss and apply goal setting through the application of the S.M.A.R.T. Method.   Other Matters of the Heart: - Provides group verbal, written materials and models to describe Heart Failure, Angina, Valve Disease, and Diabetes in the realm of heart disease. Includes description of the disease process and treatment options available to the cardiac patient.   Exercise & Equipment Safety: - Individual verbal instruction and demonstration of equipment use and safety with use of the equipment.          Cardiac Rehab from 01/07/2015 in [facility] Cardiac Rehab   Date  01/07/15   Educator  C. Enterkin,RN   Instruction Review Code  1- partially meets, needs review/practice      Infection Prevention: - Provides verbal and written material to individual with discussion of infection control including proper hand washing and proper equipment cleaning during exercise session.      Cardiac Rehab from 01/07/2015 in [facility] Cardiac Rehab   Date  01/07/15   Educator  kin,RN   Instruction Review Code  2- meets  goals/outcomes      Falls Prevention: - Provides verbal and written material to individual with discussion of falls prevention and safety.      Cardiac Rehab from 01/07/2015 in [facility] Cardiac Rehab   Date  01/07/15   Educator  C. Enterkin   Instruction Review Code  2- meets goals/outcomes      Diabetes: - Individual verbal and written instruction to review signs/symptoms of diabetes, desired ranges of glucose level fasting, after meals and with exercise. Advice that pre and post exercise glucose checks will be done for 3 sessions at entry of program.    Knowledge Questionnaire Score:     Knowledge Questionnaire Score - 01/07/15 1510    Knowledge Questionnaire Score   Pre Score 21      Personal Goals and Risk Factors at Admission:     Personal Goals and Risk Factors at Admission - 01/07/15 1804    Personal Goals and Risk Factors on Admission   Increase Aerobic Exercise and Physical Activity Yes   Intervention While in program, learn and follow the exercise prescription taught. Start at a low level workload and increase workload after able to maintain previous level for 30 minutes. Increase time before increasing intensity.   Take Less Medication Yes   Intervention Learn your risk factors and begin the lifestyle modifications for risk factor control during your time in the program.   Diabetes No   Hypertension Yes   Goal Participant will see blood pressure controlled within the values of 140/29mm/Hg or within value directed by their physician.   Intervention Provide nutrition & aerobic exercise along with prescribed medications to achieve BP  140/90 or less.   Stress Yes   Goal To meet with psychosocial counselor for stress and relaxation information and guidance. To state understanding of performing relaxation techniques and or identifying personal stressors.   Intervention Provide education on types of stress, identifiying stressors, and ways to cope with stress. Provide  demonstration and active practice of relaxation techniques.      Personal Goals and Risk Factors Review:    Personal Goals Discharge (Final Personal Goals and Risk Factors Review):     Comments: Jayanna called 01/10/2015 and said she could not attend Cardiac Rehab since her feet were swollen.

## 2015-01-11 NOTE — Telephone Encounter (Signed)
Christie Carpenter called and said she could not attend Cardiac Rehab since her feet were swollen.

## 2015-01-15 ENCOUNTER — Encounter: Payer: Self-pay | Admitting: *Deleted

## 2015-01-15 DIAGNOSIS — Z9889 Other specified postprocedural states: Secondary | ICD-10-CM

## 2015-01-16 ENCOUNTER — Encounter: Payer: Self-pay | Admitting: *Deleted

## 2015-01-16 DIAGNOSIS — L03115 Cellulitis of right lower limb: Secondary | ICD-10-CM | POA: Diagnosis present

## 2015-01-16 LAB — BASIC METABOLIC PANEL
Anion gap: 7 (ref 5–15)
BUN: 18 mg/dL (ref 6–20)
CHLORIDE: 97 mmol/L — AB (ref 101–111)
CO2: 31 mmol/L (ref 22–32)
CREATININE: 0.87 mg/dL (ref 0.44–1.00)
Calcium: 8.9 mg/dL (ref 8.9–10.3)
GFR calc Af Amer: >60 mL/min (ref >60)
GFR calc non Af Amer: >60 mL/min (ref >60)
Glucose, Bld: 172 mg/dL — ABNORMAL HIGH (ref 65–99)
Potassium: 3.3 mmol/L — ABNORMAL LOW (ref 3.5–5.1)
Sodium: 135 mmol/L (ref 135–145)

## 2015-01-16 LAB — CBC
HEMATOCRIT: 31.8 % — AB (ref 35.0–47.0)
Hemoglobin: 10.5 g/dL — ABNORMAL LOW (ref 12.0–16.0)
MCH: 31.6 pg (ref 26.0–34.0)
MCHC: 33.1 g/dL (ref 32.0–36.0)
MCV: 95.5 fL (ref 80.0–100.0)
Platelets: 268 10*3/uL (ref 150–440)
RBC: 3.33 MIL/uL — AB (ref 3.80–5.20)
RDW: 16 % — AB (ref 11.5–14.5)
WBC: 8.6 10*3/uL (ref 3.6–11.0)

## 2015-01-16 NOTE — ED Notes (Signed)
Pt has swelling in right foot and ankle for 1 week.  Pt taking doxycycline x 6 days.   No drainage.

## 2015-01-17 ENCOUNTER — Emergency Department: Payer: Medicare Other

## 2015-01-17 ENCOUNTER — Observation Stay
Admission: EM | Admit: 2015-01-17 | Discharge: 2015-01-17 | Disposition: A | Discharge status: Home / Self Care | Payer: Medicare Other | Attending: Internal Medicine | Admitting: Internal Medicine

## 2015-01-17 DIAGNOSIS — L0390 Cellulitis, unspecified: Secondary | ICD-10-CM | POA: Diagnosis present

## 2015-01-17 DIAGNOSIS — L03115 Cellulitis of right lower limb: Secondary | ICD-10-CM

## 2015-01-17 MED ORDER — DOXYCYCLINE HYCLATE 100 MG PO TABS: 100.0000 mg | ORAL_TABLET | Freq: Two times a day (BID) | ORAL | Status: DC

## 2015-01-17 MED ORDER — ONDANSETRON HCL 4 MG/2ML IJ SOLN: 4.0000 mg | Freq: Four times a day (QID) | INTRAMUSCULAR | Status: DC | PRN

## 2015-01-17 MED ORDER — ATORVASTATIN CALCIUM 20 MG PO TABS: 80.0000 mg | ORAL_TABLET | Freq: Every day | ORAL | Status: DC

## 2015-01-17 MED ORDER — APIXABAN 5 MG PO TABS: 5.0000 mg | ORAL_TABLET | Freq: Two times a day (BID) | ORAL | Status: DC

## 2015-01-17 MED ORDER — FUROSEMIDE 40 MG PO TABS: 40.0000 mg | ORAL_TABLET | Freq: Every day | ORAL | Status: DC

## 2015-01-17 MED ORDER — LEVOTHYROXINE SODIUM 100 MCG PO TABS: 100.0000 ug | ORAL_TABLET | Freq: Every morning | ORAL | Status: DC

## 2015-01-17 MED ORDER — ONDANSETRON HCL 4 MG PO TABS: 4.0000 mg | ORAL_TABLET | Freq: Four times a day (QID) | ORAL | Status: DC | PRN

## 2015-01-17 MED ORDER — VANCOMYCIN HCL IN DEXTROSE 750-5 MG/150ML-% IV SOLN: 750.0000 mg | Freq: Once | INTRAVENOUS | Status: DC

## 2015-01-17 MED ORDER — FERROUS SULFATE 325 (65 FE) MG PO TABS: 325.0000 mg | ORAL_TABLET | Freq: Every day | ORAL | Status: DC

## 2015-01-17 MED ORDER — VANCOMYCIN HCL IN DEXTROSE 750-5 MG/150ML-% IV SOLN
750.0000 mg | INTRAVENOUS | Status: DC
Start: 2015-01-17 — End: 2015-01-17

## 2015-01-17 MED ORDER — LEVETIRACETAM 500 MG PO TABS: 500.0000 mg | ORAL_TABLET | Freq: Two times a day (BID) | ORAL | Status: DC

## 2015-01-17 MED ORDER — SENNOSIDES-DOCUSATE SODIUM 8.6-50 MG PO TABS: 1.0000 | ORAL_TABLET | Freq: Every day | ORAL | Status: DC

## 2015-01-17 MED ORDER — METOPROLOL SUCCINATE ER 50 MG PO TB24: 25.0000 mg | ORAL_TABLET | Freq: Every day | ORAL | Status: DC

## 2015-01-17 MED ORDER — ACETAMINOPHEN 325 MG PO TABS: 650.0000 mg | ORAL_TABLET | Freq: Four times a day (QID) | ORAL | Status: DC

## 2015-01-17 MED ORDER — SPIRONOLACTONE 25 MG PO TABS: 12.5000 mg | ORAL_TABLET | Freq: Every day | ORAL | Status: DC

## 2015-01-17 MED ORDER — MORPHINE SULFATE (PF) 2 MG/ML IV SOLN: 1.0000 mg | INTRAVENOUS | Status: DC | PRN

## 2015-01-17 MED ORDER — SODIUM CHLORIDE 0.9 % IJ SOLN: 3.0000 mL | Freq: Two times a day (BID) | INTRAMUSCULAR | Status: DC

## 2015-01-17 MED ORDER — POTASSIUM CHLORIDE IN NACL 40-0.9 MEQ/L-% IV SOLN: INTRAVENOUS | Status: DC

## 2015-01-17 MED ORDER — LISINOPRIL 2.5 MG PO TABS
2.5000 mg | ORAL_TABLET | Freq: Every day | ORAL | Status: DC
Start: 2015-01-17 — End: 2015-01-17

## 2015-01-17 MED ORDER — POTASSIUM CHLORIDE CRYS ER 20 MEQ PO TBCR: 20.0000 meq | EXTENDED_RELEASE_TABLET | Freq: Every day | ORAL | Status: DC

## 2015-01-17 MED ORDER — GABAPENTIN 300 MG PO CAPS: 300.0000 mg | ORAL_CAPSULE | Freq: Every day | ORAL | Status: DC

## 2015-01-17 NOTE — Progress Notes (Signed)
ANTIBIOTIC CONSULT NOTE - INITIAL  Pharmacy Consult for vancomycin dosing Indication: cellulitis  No Known Allergies  Patient Measurements: Height: 5\' 5"  (165.1 cm) Weight: 165 lb (74.844 kg) IBW/kg (Calculated) : 57 Adjusted Body Weight: 64kg  Vital Signs: Temp: 98.1 F (36.7 C) (10/05 2200) BP: 125/74 mmHg (10/06 0400) Pulse Rate: 96 (10/06 0400) Intake/Output from previous day:   Intake/Output from this shift:    Labs:  Recent Labs  01/16/15 2207  WBC 8.6  HGB 10.5*  PLT 268  CREATININE 0.87   Estimated Creatinine Clearance: 61.8 mL/min (by C-G formula based on Cr of 0.87). No results for input(s): VANCOTROUGH, VANCOPEAK, VANCORANDOM, GENTTROUGH, GENTPEAK, GENTRANDOM, TOBRATROUGH, TOBRAPEAK, TOBRARND, AMIKACINPEAK, AMIKACINTROU, AMIKACIN in the last 72 hours.   Microbiology: No results found for this or any previous visit (from the past 720 hour(s)).  Medical History: Past Medical History  Diagnosis Date  . CHF (congestive heart failure) ([place])   . Pulmonary hypertension ([place])   . Mitral valve problem   . Coronary artery disease   . Presence of permanent cardiac pacemaker   . Heart murmur   . Shortness of breath dyspnea   . Hypothyroidism   . Diabetes mellitus without complication ([place])   . Depression   . Cancer ([place]) 1987    non hodgkins lymphma    Medications:   Assessment: No micro  Goal of Therapy:  Vancomycin trough level 10-15 mcg/ml  Plan:  TBW 74,8kg  IBW 57kg  DW 64kg  Vd 45L kei 0.056 hr-1  T1/2 12 hours 750 mg q 18 hours ordered with stacked dosing.  Nikolai Wilczak S 01/17/2015,6:08 AM

## 2015-01-17 NOTE — ED Notes (Deleted)
Pt dressed self, refuses to sign AMA form, stating "I ain't signing nothing and I will never be back to this hospital!"; sister leaving pushing pt out to ED lobby in w/c

## 2015-01-17 NOTE — ED Notes (Signed)
Pt undressed and placed in hosp gown; reports since last Thursday having pain/swelling to right foot/ rx doxycycline and has been taking for last 6 days but pain/swelling persists radiating into left lower leg; +PP, brisk cap refill, pitting edema noted to foot/ankle; no redness noted; st hx cellulitis in left foot in July that was treated successfully with inj of rocephin and round of doxy

## 2015-01-17 NOTE — ED Notes (Signed)
Pt dressed self; pt refuses to sign AMA form stating "I ain't signing nothing and don't worry I will never be back to this hospital!"; sister taking pt out via w/c; Dr Marcille Blanco notified

## 2015-01-17 NOTE — ED Provider Notes (Signed)
[facility] Emergency Department Provider Note  ____________________________________________  Time seen: Approximately 3:25 AM  I have reviewed the triage vital signs and the nursing notes.   HISTORY  Chief Complaint Leg Swelling    HPI Christie Carpenter is a 69 y.o. female who presents to the ED from home with the chief complaint of right lower extremity pain and swelling. Patient is on day 6 of 10 of doxycycline which was prescribed for right lower extremity cellulitis. Presents with increased pain and swelling now into her calf. Patient notes fever, chills, generalized malaise and nausea. Denies chest pain, shortness of breath, abdominal pain, vomiting, diarrhea. Nothing makes her symptoms better. Walking makes her symptoms worse.   Past Medical History  Diagnosis Date  . CHF (congestive heart failure) ([place])   . Pulmonary hypertension ([place])   . Mitral valve problem   . Coronary artery disease   . Presence of permanent cardiac pacemaker   . Heart murmur   . Shortness of breath dyspnea   . Hypothyroidism   . Diabetes mellitus without complication ([place])   . Depression   . Cancer ([place]) 1987    non hodgkins lymphma    Patient Active Problem List   Diagnosis Date Noted  . Acute on chronic diastolic CHF (congestive heart failure), NYHA class 3 ([place]) 09/19/2014  . CHF (congestive heart failure) ([place]) 09/17/2014  . Severe mitral insufficiency 09/17/2014  . Hypothyroidism 09/17/2014  . Pulmonary hypertension ([place]) 09/17/2014  . CAD (coronary artery disease) 09/17/2014  . Pacemaker 09/17/2014    Past Surgical History  Procedure Laterality Date  . Pacemaker insertion      Current Outpatient Rx  Name  Route  Sig  Dispense  Refill  . albuterol (PROVENTIL HFA;VENTOLIN HFA) 108 (90 BASE) MCG/ACT inhaler   Inhalation   Inhale 2 puffs into the lungs every 6 (six) hours as needed for wheezing or shortness of breath.         Marland Kitchen apixaban (ELIQUIS) 5 MG TABS  tablet   Oral   Take 5 mg by mouth 2 (two) times daily.         . cetirizine (ZYRTEC) 10 MG tablet   Oral   Take 10 mg by mouth daily as needed for allergies.         . furosemide (LASIX) 40 MG tablet   Oral   Take 40 mg by mouth daily.         Marland Kitchen levofloxacin (LEVAQUIN) 750 MG tablet   Oral   Take 1 tablet (750 mg total) by mouth daily.   7 tablet   0   . levothyroxine (SYNTHROID, LEVOTHROID) 75 MCG tablet   Oral   Take 75 mcg by mouth daily before breakfast.         . metoprolol succinate (TOPROL-XL) 50 MG 24 hr tablet   Oral   Take 50 mg by mouth daily. Take with or immediately following a meal.         . senna-docusate (SENOKOT-S) 8.6-50 MG tablet   Oral   Take by mouth.           Allergies Review of patient's allergies indicates no known allergies.  Family History  Problem Relation Age of Onset  . Leukemia Other     Social History Social History  Substance Use Topics  . Smoking status: Former Research scientist (life sciences)  . Smokeless tobacco: Never Used  . Alcohol Use: No    Review of Systems Constitutional: Positive for fever/chills Eyes: No  visual changes. ENT: No sore throat. Cardiovascular: Denies chest pain. Respiratory: Denies shortness of breath. Gastrointestinal: No abdominal pain.  Positive for nausea, no vomiting.  No diarrhea.  No constipation. Genitourinary: Negative for dysuria. Musculoskeletal: Positive for right lower extremity pain, redness and swelling. Negative for back pain. Skin: Negative for rash. Neurological: Negative for headaches, focal weakness or numbness.  10-point ROS otherwise negative.  ____________________________________________   PHYSICAL EXAM:  VITAL SIGNS: ED Triage Vitals  Enc Vitals Group     BP 01/16/15 2200 96/58 mmHg     Pulse Rate 01/16/15 2200 102     Resp 01/16/15 2200 20     Temp 01/16/15 2200 98.1 F (36.7 C)     Temp src --      SpO2 01/16/15 2200 98 %     Weight 01/16/15 2200 165 lb (74.844 kg)      Height 01/16/15 2200 5\' 5"  (1.651 m)     Head Cir --      Peak Flow --      Pain Score 01/16/15 2202 5     Pain Loc --      Pain Edu? --      Excl. in [place]? --     Constitutional: Alert and oriented. Well appearing and in no acute distress. Eyes: Conjunctivae are normal. PERRL. EOMI. Head: Atraumatic. Nose: No congestion/rhinnorhea. Mouth/Throat: Mucous membranes are moist.  Oropharynx non-erythematous. Neck: No stridor.   Cardiovascular: Normal rate, regular rhythm. Grossly normal heart sounds.  Good peripheral circulation. Respiratory: Normal respiratory effort.  No retractions. Lungs CTAB. Gastrointestinal: Soft and nontender. No distention. No abdominal bruits. No CVA tenderness. Musculoskeletal:  Right lower extremity: Warmth and erythema to lateral foot extending into the ankle and leg. 2+ pitting edema. 2+ distal pulses. Brisk, less than 5 second capillary refill. Bilateral calves measure 35 cm. Pain to palpation right posterior calf. Neurologic:  Normal speech and language. No gross focal neurologic deficits are appreciated. Gait not tested secondary to pain.  Skin:  Skin is warm, dry and intact. No rash noted. Psychiatric: Mood and affect are normal. Speech and behavior are normal.  ____________________________________________   LABS (all labs ordered are listed, but only abnormal results are displayed)  Labs Reviewed  BASIC METABOLIC PANEL - Abnormal; Notable for the following:    Potassium 3.3 (*)    Chloride 97 (*)    Glucose, Bld 172 (*)    All other components within normal limits  CBC - Abnormal; Notable for the following:    RBC 3.33 (*)    Hemoglobin 10.5 (*)    HCT 31.8 (*)    RDW 16.0 (*)    All other components within normal limits   ____________________________________________  EKG  None ____________________________________________  RADIOLOGY  Right lower extremity venous Doppler ultrasound interpreted per Dr. Radene Knee: No evidence of deep venous  thrombosis. ____________________________________________   PROCEDURES  Procedure(s) performed: None  Critical Care performed: No  ____________________________________________   INITIAL IMPRESSION / ASSESSMENT AND PLAN / ED COURSE  Pertinent labs & imaging results that were available during my care of the patient were reviewed by me and considered in my medical decision making (see chart for details).  70 year old female with worsening symptoms of cellulitis despite outpatient doxycycline therapy. Will obtain blood cultures, initiate IV antibiotic and discuss with hospitalist for admission. ____________________________________________   FINAL CLINICAL IMPRESSION(S) / ED DIAGNOSES  Final diagnoses:  Cellulitis of right lower extremity      Paulette Blanch, MD 01/17/15 [phone]

## 2015-01-17 NOTE — ED Notes (Signed)
Sister to nursing station to inquire over admission status; informed on plan of care at this time and process for admission; sister also inquiring over additional blood testing; informed no tests ordered at this time but will check with the physician and also will proceed with initiating the IV start while waiting for further admission orders

## 2015-01-17 NOTE — ED Notes (Signed)
Pt to u/s via stretcher accomp by u/s tech 

## 2015-01-17 NOTE — ED Notes (Signed)
Spoke with Dr Marcille Blanco regarding blood culture draws; this nurse and MD in to speak with pt and sister regarding plan of care; pt initally expresses desire to receive a dose of vancomycin IV in the ED then go home without being admitted; MD explaining her admission and plan of care and offering her a choice and possible repercussions of such versus being admitted; pt becoming increasingly anxious and expressing anger over being here stating "I never wanted to come to this hospital anyway; when I go to [place] they tell me what I need; I didn't know I was going to have to diagnose myself!"; MD at bedside attempting to again explain his plan of care and his recommendations of drawing blood cultures and administering vancomycin IV until her systems improve; pt & sister now telling MD that "he doesn't care anything about her and has been rude since he came into her room"; pt then turns to this nurse and st "and don't you even say anything to me because you ain't even been in this room since I been here!"; MD attempting to calm pt & sister and reviewing his conversations with them; pt st "I'm not staying in this hospital, I don't trust you and I am never coming back!"

## 2015-01-17 NOTE — Progress Notes (Signed)
Called to emergency department to admit patient for cellulitis. Interviewed patient at bedside and discussed plan of care which included explanation of possible length of stay given her intermittent septic criteria as well as definition of possible failed outpatient antibiotic treatment that gave rise to concern for MRSA. Explained to the patient she did not consistently meet septic criteria but that her foot pain and malaise secondary to fever and nausea helped make the decision for inpatient observation.  She expressed dismay or dissatisfaction with the consideration that she could potentially be treated as an outpatient and that she had waited so long here at the emergency department. When I gave her options for inpatient IV antibiotics versus close follow-up as an outpatient she stated that her doctors at [facility] "would tell her what she should do" but that here she had to "make her own diagnosis". I explained that we try to practice patient centered medicine and that I did not intend to keep her against her wishes, to which the patient became angry and decided to leave.

## 2015-01-21 ENCOUNTER — Encounter: Payer: Medicare Other | Attending: Internal Medicine

## 2015-01-21 DIAGNOSIS — Z9889 Other specified postprocedural states: Secondary | ICD-10-CM | POA: Insufficient documentation

## 2015-01-23 ENCOUNTER — Other Ambulatory Visit: Payer: Self-pay | Admitting: Family Medicine

## 2015-01-23 ENCOUNTER — Ambulatory Visit
Admission: RE | Admit: 2015-01-23 | Discharge: 2015-01-23 | Disposition: A | Discharge status: Home / Self Care | Payer: Medicare Other | Source: Ambulatory Visit | Attending: Family Medicine | Admitting: Family Medicine

## 2015-01-23 DIAGNOSIS — M79671 Pain in right foot: Secondary | ICD-10-CM

## 2015-01-23 DIAGNOSIS — M7731 Calcaneal spur, right foot: Secondary | ICD-10-CM | POA: Diagnosis not present

## 2015-01-23 NOTE — Addendum Note (Signed)
Addended by: Gerlene Burdock on: 01/23/2015 06:30 PM   Modules accepted: Orders

## 2015-01-23 NOTE — Progress Notes (Signed)
Cardiac Individual Treatment Plan  Patient Details  Name: Christie Carpenter MRN: [id number] Date of Birth: Sep 08, 1945 Referring Provider:  No ref. provider found  Initial Encounter Date:    Visit Diagnosis: No diagnosis found.  Patient's Home Medications on Admission:  Current outpatient prescriptions:  .  acetaminophen (TYLENOL) 325 MG tablet, Take 2 tablets by mouth every 6 (six) hours., Disp: , Rfl:  .  apixaban (ELIQUIS) 5 MG TABS tablet, Take 5 mg by mouth 2 (two) times daily., Disp: , Rfl:  .  atorvastatin (LIPITOR) 80 MG tablet, Take 1 tablet by mouth daily., Disp: , Rfl:  .  ferrous sulfate 325 (65 FE) MG tablet, Take 1 tablet by mouth daily., Disp: , Rfl:  .  furosemide (LASIX) 40 MG tablet, Take 40 mg by mouth daily., Disp: , Rfl:  .  gabapentin (NEURONTIN) 300 MG capsule, Take 1 capsule by mouth at bedtime., Disp: , Rfl:  .  levETIRAcetam (KEPPRA) 500 MG tablet, Take 1 tablet by mouth 2 (two) times daily., Disp: , Rfl:  .  levothyroxine (SYNTHROID, LEVOTHROID) 100 MCG tablet, Take 1 tablet by mouth every morning., Disp: , Rfl:  .  lisinopril (PRINIVIL,ZESTRIL) 2.5 MG tablet, Take 1 tablet by mouth daily., Disp: , Rfl:  .  metoprolol succinate (TOPROL-XL) 25 MG 24 hr tablet, Take 1 tablet by mouth daily., Disp: , Rfl:  .  potassium chloride SA (K-DUR,KLOR-CON) 20 MEQ tablet, Take 1 tablet by mouth daily., Disp: , Rfl:  .  senna-docusate (SENOKOT-S) 8.6-50 MG tablet, Take by mouth., Disp: , Rfl:  .  spironolactone (ALDACTONE) 25 MG tablet, Take 0.5 tablets by mouth daily., Disp: , Rfl:   Past Medical History: Past Medical History  Diagnosis Date  . CHF (congestive heart failure) ([place])   . Pulmonary hypertension ([place])   . Mitral valve problem   . Coronary artery disease   . Presence of permanent cardiac pacemaker   . Heart murmur   . Shortness of breath dyspnea   . Hypothyroidism   . Diabetes mellitus without complication ([place])   . Depression   . 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 Former Smoker  Smokeless tobacco  . 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 Participant demonstrates ability to understand and report RPE using BORG scale, to self-measure pulse accurately, and to acknowledge the importance of the exercise prescription.  Exercise Prescription Goal: Starting with aerobic activity 30 plus minutes a day, 3 days per week for initial exercise prescription. Provide home exercise prescription and guidelines that participant acknowledges understanding prior to discharge.  Activity Barriers & Risk Stratification:     Activity Barriers & Risk Stratification - 01/08/15 1508    Activity Barriers & Risk Stratification   Activity Barriers None   Risk Stratification High      6 Minute Walk:     6 Minute Walk      01/07/15 1514       6 Minute Walk   Phase Initial  Distance 1010 feet     Walk Time 6 minutes     Resting HR 96 bpm     Resting BP 112/80 mmHg     Max Ex. HR 106 bpm     Max Ex. BP 118/60 mmHg     RPE 15     Symptoms Yes (comment)     Comments Shortness of breath        Initial Exercise Prescription:     Initial Exercise Prescription - 01/07/15 1500    Date of Initial Exercise Prescription   Date 01/07/15   Treadmill   MPH 1.5   Grade 0   Minutes 15   Bike   Level 0.8   Minutes 15   Recumbant Bike   Level 2   Watts 20   Minutes 15   NuStep   Level 3   Watts 30   Minutes 15   Arm Ergometer   Level 1   Watts 8   Minutes 15   Arm/Foot Ergometer   Level 1   Watts 10   Minutes 15   Cybex   Level  1   RPM 40   Minutes 15   Recumbant Elliptical   Level 1   Watts 20   Minutes 15   Elliptical   Level 1   Speed 2   Minutes 5   REL-XR   Level 2   Watts 20   Minutes 15   Prescription Details   Frequency (times per week) 3   Duration Progress to 30 minutes of continuous aerobic without signs/symptoms of physical distress   Intensity   THRR REST +  30   Ratings of Perceived Exertion 11-15   Progression Continue progressive overload as per policy without signs/symptoms or physical distress.   Resistance Training   Training Prescription Yes   Weight 2   Reps 10-12      Exercise Prescription Changes:     Exercise Prescription Changes      01/15/15 1500           Exercise Review   Progression No  Has not started exercise yet, medical review 01/07/15          Discharge Exercise Prescription (Final Exercise Prescription Changes):     Exercise Prescription Changes - 01/15/15 1500    Exercise Review   Progression No  Has not started exercise yet, medical review 01/07/15      Nutrition:  Target Goals: Understanding of nutrition guidelines, daily intake of sodium 1500mg , cholesterol 200mg , calories 30% from fat and 7% or less from saturated fats, daily to have 5 or more servings of fruits and vegetables.  Biometrics:     Pre Biometrics - 01/07/15 1518    Pre Biometrics   Height 5\' 6"  (1.676 m)   Weight 165 lb (74.844 kg)   Waist Circumference 38 inches   Hip Circumference 42 inches   Waist to Hip Ratio 0.9 %   BMI (Calculated) 26.7       Nutrition Therapy Plan and Nutrition Goals:     Nutrition Therapy & Goals - 01/08/15 1512    Nutrition Therapy   Drug/Food Interactions Statins/Certain Fruits   Intervention Plan   Intervention Using nutrition plan and personal goals to gain a healthy nutrition lifestyle. Add exercise as prescribed.      Nutrition Discharge: Rate Your Plate Scores:   Nutrition Goals Re-Evaluation:   Psychosocial: Target  Goals: Acknowledge presence or absence of depression, maximize coping skills, provide  positive support system. Participant is able to verbalize types and ability to use techniques and skills needed for reducing stress and depression.  Initial Review & Psychosocial Screening:     Initial Psych Review & Screening - 01/07/15 1537    Initial Review   Current issues with Current Depression;Current Stress Concerns   Family Dynamics   Good Support System? Yes   Screening Interventions   Interventions Encouraged to exercise;Program counselor consult;Other (comment)   Comments Christie Carpenter stated she is depressed and has "tried medicine and counseling before and it didn't help. Anyone would be depressed if they had gone through what I have in the past 10 years. I took care of my husband for 5 years who was paralzyed on one side with a stroke. My daughter also had the same type of cancer 6 years ago and we moved up to [facility] to help take care of her 3 /4 children while she was receiving treatments. 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 I encouraged her to exercise and will consult our Cardiac REhab mental health counselor.       Quality of Life Scores:     Quality of Life - 01/07/15 1513    Quality of Life Scores   Health/Function Pre 17.14 %   Socioeconomic Pre 19.83 %   Psych/Spiritual Pre 15.43 %   Family Pre 25.5 %   GLOBAL Pre 18.35 %      PHQ-9:     Recent Review Flowsheet Data    Depression screen [facility] 2/9 01/07/2015   Decreased Interest 3   Down, Depressed, Hopeless 2   PHQ - 2 Score 5   Altered sleeping 1   Tired, decreased energy 2   Change in appetite 3   Feeling bad or failure about yourself  1   Trouble concentrating 1   Moving slowly or fidgety/restless 1   Suicidal thoughts 1   PHQ-9 Score 15   Difficult doing work/chores Somewhat difficult      Psychosocial Evaluation and Intervention:   Psychosocial Re-Evaluation:   Vocational  Rehabilitation: Provide vocational rehab assistance to qualifying candidates.   Vocational Rehab Evaluation & Intervention:     Vocational Rehab - 01/07/15 1535    Initial Vocational Rehab Evaluation & Intervention   Assessment shows need for Vocational Rehabilitation No      Education: Education Goals: Education classes will be provided on a weekly basis, covering required topics. Participant will state understanding/return demonstration of topics presented.  Learning Barriers/Preferences:     Learning Barriers/Preferences - 01/08/15 1509    Learning Barriers/Preferences   Learning Barriers None   Learning Preferences None      Education Topics: General Nutrition Guidelines/Fats and Fiber: -Group instruction provided by verbal, written material, models and posters to present the general guidelines for heart healthy nutrition. Gives an explanation and review of dietary fats and fiber.   Controlling Sodium/Reading Food Labels: -Group verbal and written material supporting the discussion of sodium use in heart healthy nutrition. Review and explanation with models, verbal and written materials for utilization of the food label.   Exercise Physiology & Risk Factors: - Group verbal and written instruction with models to review the exercise physiology of the cardiovascular system and associated critical values. Details cardiovascular disease risk factors and the goals associated with each risk factor.   Aerobic Exercise & Resistance Training: - Gives group verbal and written discussion on the health impact of inactivity. On the components of aerobic and  resistive training programs and the benefits of this training and how to safely progress through these programs.   Flexibility, Balance, General Exercise Guidelines: - Provides group verbal and written instruction on the benefits of flexibility and balance training programs. Provides general exercise guidelines with specific  guidelines to those with heart or lung disease. Demonstration and skill practice provided.   Stress Management: - Provides group verbal and written instruction about the health risks of elevated stress, cause of high stress, and healthy ways to reduce stress.   Depression: - Provides group verbal and written instruction on the correlation between heart/lung disease and depressed mood, treatment options, and the stigmas associated with seeking treatment.   Anatomy & Physiology of the Heart: - Group verbal and written instruction and models provide basic cardiac anatomy and physiology, with the coronary electrical and arterial systems. Review of: AMI, Angina, Valve disease, Heart Failure, Cardiac Arrhythmia, Pacemakers, and the ICD.   Cardiac Procedures: - Group verbal and written instruction and models to describe the testing methods done to diagnose heart disease. Reviews the outcomes of the test results. Describes the treatment choices: Medical Management, Angioplasty, or Coronary Bypass Surgery.   Cardiac Medications: - Group verbal and written instruction to review commonly prescribed medications for heart disease. Reviews the medication, class of the drug, and side effects. Includes the steps to properly store meds and maintain the prescription regimen.   Go Sex-Intimacy & Heart Disease, Get SMART - Goal Setting: - Group verbal and written instruction through game format to discuss heart disease and the return to sexual intimacy. Provides group verbal and written material to discuss and apply goal setting through the application of the S.M.A.R.T. Method.   Other Matters of the Heart: - Provides group verbal, written materials and models to describe Heart Failure, Angina, Valve Disease, and Diabetes in the realm of heart disease. Includes description of the disease process and treatment options available to the cardiac patient.   Exercise & Equipment Safety: - Individual verbal  instruction and demonstration of equipment use and safety with use of the equipment.          Cardiac Rehab from 01/07/2015 in [facility] Cardiac Rehab   Date  01/07/15   Educator  C. Imani Fiebelkorn,RN   Instruction Review Code  1- partially meets, needs review/practice      Infection Prevention: - Provides verbal and written material to individual with discussion of infection control including proper hand washing and proper equipment cleaning during exercise session.      Cardiac Rehab from 01/07/2015 in [facility] Cardiac Rehab   Date  01/07/15   Educator  kin,RN   Instruction Review Code  2- meets goals/outcomes      Falls Prevention: - Provides verbal and written material to individual with discussion of falls prevention and safety.      Cardiac Rehab from 01/07/2015 in [facility] Cardiac Re[facility]   Date  01/07/15   Educator  C. Arthor Gorter   Instruction Review Code  2- meets goals/outcomes      Diabetes: - Individual verbal and written instruction to review signs/symptoms of diabetes, desired ranges of glucose level fasting, after meals and with exercise. Advice that pre and post exercise glucose checks will be done for 3 sessions at entry of program.    Knowledge Questionnaire Score:     Knowledge Questionnaire Score - 01/07/15 1510    Knowledge Questionnaire Score   Pre Score 21      Personal Goals and Risk Factors at Admission:  Personal Goals and Risk Factors at Admission - 01/07/15 1804    Personal Goals and Risk Factors on Admission   Increase Aerobic Exercise and Physical Activity Yes   Intervention While in program, learn and follow the exercise prescription taught. Start at a low level workload and increase workload after able to maintain previous level for 30 minutes. Increase time before increasing intensity.   Take Less Medication Yes   Intervention Learn your risk factors and begin the lifestyle modifications for risk factor control during your time in the program.   Diabetes  No   Hypertension Yes   Goal Participant will see blood pressure controlled within the values of 140/58mm/Hg or within value directed by their physician.   Intervention Provide nutrition & aerobic exercise along with prescribed medications to achieve BP 140/90 or less.   Stress Yes   Goal To meet with psychosocial counselor for stress and relaxation information and guidance. To state understanding of performing relaxation techniques and or identifying personal stressors.   Intervention Provide education on types of stress, identifiying stressors, and ways to cope with stress. Provide demonstration and active practice of relaxation techniques.      Personal Goals and Risk Factors Review:    Personal Goals Discharge (Final Personal Goals and Risk Factors Review):     Comments: Has missed a lot due to foot hurting her.

## 2015-02-06 ENCOUNTER — Encounter: Payer: Medicare Other | Admitting: *Deleted

## 2015-02-06 DIAGNOSIS — Z9889 Other specified postprocedural states: Secondary | ICD-10-CM

## 2015-02-06 NOTE — Progress Notes (Signed)
Daily Session Note  Patient Details  Name: Christie Carpenter MRN: [id number] Date of Birth: April 25, 1945 Referring Provider:  Ellamae Sia, MD  Encounter Date: 02/06/2015  Check In:     Session Check In - 02/06/15 1621    Check-In   Staff Present Candiss Norse MS, ACSM CEP Exercise Physiologist;Carroll Enterkin RN, BSN;Diane Joya Gaskins RN, BSN   ER physicians immediately available to respond to emergencies See telemetry face sheet for immediately available ER MD   Medication changes reported     No   Fall or balance concerns reported    No   Warm-up and Cool-down Performed on first and last piece of equipment   VAD Patient? No   Pain Assessment   Currently in Pain? No/denies   Multiple Pain Sites No         Goals Met:  Independence with exercise equipment Exercise tolerated well Personal goals reviewed No report of cardiac concerns or symptoms Strength training completed today  Goals Unmet:  Not Applicable  Goals Comments: First day back after a month, re-reviewed her exercise prescription and exercise goals.    Dr. Emily Filbert is Medical Director for [facility].

## 2015-02-07 DIAGNOSIS — Z9889 Other specified postprocedural states: Secondary | ICD-10-CM

## 2015-02-07 NOTE — Progress Notes (Signed)
Cardiac Individual Treatment Plan  Patient Details  Name: Christie Carpenter MRN: [id number] Date of Birth: Feb 13, 1946 Referring Provider:  Ellamae Sia, MD  Initial Encounter Date:    Visit Diagnosis: S/P MVR (mitral valve repair)  Patient's Home Medications on Admission:  Current outpatient prescriptions:  .  acetaminophen (TYLENOL) 325 MG tablet, Take 2 tablets by mouth every 6 (six) hours., Disp: , Rfl:  .  apixaban (ELIQUIS) 5 MG TABS tablet, Take 5 mg by mouth 2 (two) times daily., Disp: , Rfl:  .  atorvastatin (LIPITOR) 80 MG tablet, Take 1 tablet by mouth daily., Disp: , Rfl:  .  ferrous sulfate 325 (65 FE) MG tablet, Take 1 tablet by mouth daily., Disp: , Rfl:  .  furosemide (LASIX) 40 MG tablet, Take 40 mg by mouth daily., Disp: , Rfl:  .  gabapentin (NEURONTIN) 300 MG capsule, Take 1 capsule by mouth at bedtime., Disp: , Rfl:  .  levETIRAcetam (KEPPRA) 500 MG tablet, Take 1 tablet by mouth 2 (two) times daily., Disp: , Rfl:  .  levothyroxine (SYNTHROID, LEVOTHROID) 100 MCG tablet, Take 1 tablet by mouth every morning., Disp: , Rfl:  .  lisinopril (PRINIVIL,ZESTRIL) 2.5 MG tablet, Take 1 tablet by mouth daily., Disp: , Rfl:  .  metoprolol succinate (TOPROL-XL) 25 MG 24 hr tablet, Take 1 tablet by mouth daily., Disp: , Rfl:  .  potassium chloride SA (K-DUR,KLOR-CON) 20 MEQ tablet, Take 1 tablet by mouth daily., Disp: , Rfl:  .  senna-docusate (SENOKOT-S) 8.6-50 MG tablet, Take by mouth., Disp: , Rfl:  .  spironolactone (ALDACTONE) 25 MG tablet, Take 0.5 tablets by mouth daily., Disp: , Rfl:   Past Medical History: Past Medical History  Diagnosis Date  . CHF (congestive heart failure) ([place])   . Pulmonary hypertension ([place])   . Mitral valve problem   . Coronary artery disease   . Presence of permanent cardiac pacemaker   . Heart murmur   . Shortness of breath dyspnea   . Hypothyroidism   . Diabetes mellitus without complication ([place])   . Depression   . Cancer ([place]) 1987     non hodgkins lymphma    Tobacco Use: History  Smoking status  . Former Smoker  Smokeless tobacco  . 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 Participant demonstrates ability to understand and report RPE using BORG scale, to self-measure pulse accurately, and to acknowledge the importance of the exercise prescription.  Exercise Prescription Goal: Starting with aerobic activity 30 plus minutes a day, 3 days per week for initial exercise prescription. Provide home exercise prescription and guidelines that participant acknowledges understanding prior to discharge.  Activity Barriers & Risk Stratification:     Activity Barriers & Risk Stratification - 01/08/15 1508    Activity Barriers & Risk Stratification   Activity Barriers None   Risk Stratification High      6 Minute Walk:     6 Minute Walk      01/07/15 1514       6 Minute Walk   Phase Initial  Distance 1010 feet     Walk Time 6 minutes     Resting HR 96 bpm     Resting BP 112/80 mmHg     Max Ex. HR 106 bpm     Max Ex. BP 118/60 mmHg     RPE 15     Symptoms Yes (comment)     Comments Shortness of breath        Initial Exercise Prescription:     Initial Exercise Prescription - 01/07/15 1500    Date of Initial Exercise Prescription   Date 01/07/15   Treadmill   MPH 1.5   Grade 0   Minutes 15   Bike   Level 0.8   Minutes 15   Recumbant Bike   Level 2   Watts 20   Minutes 15   NuStep   Level 3   Watts 30   Minutes 15   Arm Ergometer   Level 1   Watts 8   Minutes 15   Arm/Foot Ergometer   Level 1   Watts 10   Minutes 15   Cybex    Level 1   RPM 40   Minutes 15   Recumbant Elliptical   Level 1   Watts 20   Minutes 15   Elliptical   Level 1   Speed 2   Minutes 5   REL-XR   Level 2   Watts 20   Minutes 15   Prescription Details   Frequency (times per week) 3   Duration Progress to 30 minutes of continuous aerobic without signs/symptoms of physical distress   Intensity   THRR REST +  30   Ratings of Perceived Exertion 11-15   Progression Continue progressive overload as per policy without signs/symptoms or physical distress.   Resistance Training   Training Prescription Yes   Weight 2   Reps 10-12      Exercise Prescription Changes:     Exercise Prescription Changes      01/15/15 1500           Exercise Review   Progression No  Has not started exercise yet, medical review 01/07/15          Discharge Exercise Prescription (Final Exercise Prescription Changes):     Exercise Prescription Changes - 01/15/15 1500    Exercise Review   Progression No  Has not started exercise yet, medical review 01/07/15      Nutrition:  Target Goals: Understanding of nutrition guidelines, daily intake of sodium <1552m, cholesterol <2035m calories 30% from fat and 7% or less from saturated fats, daily to have 5 or more servings of fruits and vegetables.  Biometrics:     Pre Biometrics - 01/07/15 1518    Pre Biometrics   Height '5\' 6"'  (1.676 m)   Weight 165 lb (74.844 kg)   Waist Circumference 38 inches   Hip Circumference 42 inches   Waist to Hip Ratio 0.9 %   BMI (Calculated) 26.7       Nutrition Therapy Plan and Nutrition Goals:     Nutrition Therapy & Goals - 01/08/15 1512    Nutrition Therapy   Drug/Food Interactions Statins/Certain Fruits   Intervention Plan   Intervention Using nutrition plan and personal goals to gain a healthy nutrition lifestyle. Add exercise as prescribed.      Nutrition Discharge: Rate Your Plate Scores:   Nutrition Goals Re-Evaluation:     Nutrition  Goals Re-Evaluation      02/07/15 1642  Personal Goal #1 Re-Evaluation   Personal Goal #1 Cydnie prefers not to meet individually with the dietician. She said her doctor told her she can have 2055m of sodium a day and a "can of soup" has 15023m        Goal Progress Seen Yes          Psychosocial: Target Goals: Acknowledge presence or absence of depression, maximize coping skills, provide positive support system. Participant is able to verbalize types and ability to use techniques and skills needed for reducing stress and depression.  Initial Review & Psychosocial Screening:     Initial Psych Review & Screening - 01/07/15 1537    Initial Review   Current issues with Current Depression;Current Stress Concerns   Family Dynamics   Good Support System? Yes   Screening Interventions   Interventions Encouraged to exercise;Program counselor consult;Other (comment)   Comments ElJeniah Kishitated she is depressed and has "tried medicine and counseling before and it didn't help. Anyone would be depressed if they had gone through what I have in the past 10 years. I took care of my husband for 5 years who was paralzyed on one side with a stroke. My daughter also had the same type of cancer 6 years ago and [place] care of [place] 3 [place]4 children while she was receiving treatments. 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       Quality of Life Scores:     Quality of Life - 01/07/15 1513    Quality of Life Scores   Health/Function Pre 17.14 %   Socioeconomic Pre 19.83 %   Psych/Spiritual Pre 15.43 %   Family Pre 25.5 %   GLOBAL Pre 18.35 %      PHQ-9:     Recent Review Flowsheet Data    [facility]   Decreased Interest 3   Down, Depressed, Hopeless 2   PHQ - 2 Score 5   Altered sleeping 1   Tired, decreased energy 2   Change in  appetite 3   Feeling bad or failure about yourself  1   Trouble concentrating 1   Moving slowly or fidgety/restless 1   Suicidal thoughts 1   PHQ-9 Score 15   Difficult doing work/chores Somewhat difficult      Psychosocial Evaluation and Intervention:     Psychosocial Evaluation - 02/07/15 1646    Psychosocial Evaluation & Interventions   Continued Psychosocial Services Needed Yes      Psychosocial Re-Evaluation:   Vocational Rehabilitation: Provide vocational rehab assistance to qualifying candidates.   Vocational Rehab Evaluation & Intervention:     Vocational Rehab - 01/07/15 1535    Initial Vocational Rehab Evaluation & Intervention   Assessment shows need for Vocational Rehabilitation No      Education: Education Goals: Education classes will be provided on a weekly basis, covering required topics. Participant will state understanding/return demonstration of topics presented.  Learning Barriers/Preferences:     Learning Barriers/Preferences - 01/08/15 1509    Learning Barriers/Preferences   Learning Barriers None   Learning Preferences None      Education Topics: General Nutrition Guidelines/Fats and Fiber: -Group instruction provided by verbal, written material, models and posters to present the general guidelines for heart healthy nutrition. Gives an explanation and review of dietary fats and fiber.   Controlling Sodium/Reading Food  Labels: -Group verbal and written material supporting the discussion of sodium use in heart healthy nutrition. Review and explanation with models, verbal and written materials for utilization of the food label.   Exercise Physiology & Risk Factors: - Group verbal and written instruction with models to review the exercise physiology of the cardiovascular system and associated critical values. Details cardiovascular disease risk factors and the goals associated with each risk factor.   Aerobic Exercise & Resistance  Training: - Gives group verbal and written discussion on the health impact of inactivity. On the components of aerobic and resistive training programs and the benefits of this training and how to safely progress through these programs.   Flexibility, Balance, General Exercise Guidelines: - Provides group verbal and written instruction on the benefits of flexibility and balance training programs. Provides general exercise guidelines with specific guidelines to those with heart or lung disease. Demonstration and skill practice provided.   Stress Management: - Provides group verbal and written instruction about the health risks of elevated stress, cause of high stress, and healthy ways to reduce stress.   Depression: - Provides group verbal and written instruction on the correlation between heart/lung disease and depressed mood, treatment options, and the stigmas associated with seeking treatment.          Cardiac Rehab from 02/06/2015 in [facility] Cardiac Rehab   Date  02/06/15   Educator  [facility]   Instruction Review Code  2- meets goals/outcomes      Anatomy & Physiology of the Heart: - Group verbal and written instruction and models provide basic cardiac anatomy and physiology, with the coronary electrical and arterial systems. Review of: AMI, Angina, Valve disease, Heart Failure, Cardiac Arrhythmia, Pacemakers, and the ICD.   Cardiac Procedures: - Group verbal and written instruction and models to describe the testing methods done to diagnose heart disease. Reviews the outcomes of the test results. Describes the treatment choices: Medical Management, Angioplasty, or Coronary Bypass Surgery.   Cardiac Medications: - Group verbal and written instruction to review commonly prescribed medications for heart disease. Reviews the medication, class of the drug, and side effects. Includes the steps to properly store meds and maintain the prescription regimen.   Go Sex-Intimacy & Heart Disease, Get  SMART - Goal Setting: - Group verbal and written instruction through game format to discuss heart disease and the return to sexual intimacy. Provides group verbal and written material to discuss and apply goal setting through the application of the S.M.A.R.T. Method.   Other Matters of the Heart: - Provides group verbal, written materials and models to describe Heart Failure, Angina, Valve Disease, and Diabetes in the realm of heart disease. Includes description of the disease process and treatment options available to the cardiac patient.   Exercise & Equipment Safety: - Individual verbal instruction and demonstration of equipment use and safety with use of the equipment.      Cardiac Rehab from 02/06/2015 in [facility] Cardiac Rehab   Date  01/07/15   Educator  C. Agustin Swatek,RN   Instruction Review Code  1- partially meets, needs review/practice      Infection Prevention: - Provides verbal and written material to individual with discussion of infection control including proper hand washing and proper equipment cleaning during exercise session.      Cardiac Rehab from 02/06/2015 in [facility] Cardiac Rehab   Date  01/07/15   Educator  kin,RN   Instruction Review Code  2- meets goals/outcomes      Falls Prevention: - Provides  verbal and written material to individual with discussion of falls prevention and safety.      Cardiac Rehab from 02/06/2015 in [facility] Cardiac Rehab   Date  01/07/15   Educator  C. Izack Hoogland   Instruction Review Code  2- meets goals/outcomes      Diabetes: - Individual verbal and written instruction to review signs/symptoms of diabetes, desired ranges of glucose level fasting, after meals and with exercise. Advice that pre and post exercise glucose checks will be done for 3 sessions at entry of program.    Knowledge Questionnaire Score:     Knowledge Questionnaire Score - 01/07/15 1510    Knowledge Questionnaire Score   Pre Score 21      Personal Goals and Risk  Factors at Admission:     Personal Goals and Risk Factors at Admission - 01/07/15 1804    Personal Goals and Risk Factors on Admission   Increase Aerobic Exercise and Physical Activity Yes   Intervention While in program, learn and follow the exercise prescription taught. Start at a low level workload and increase workload after able to maintain previous level for 30 minutes. Increase time before increasing intensity.   Take Less Medication Yes   Intervention Learn your risk factors and begin the lifestyle modifications for risk factor control during your time in the program.   Diabetes No   Hypertension Yes   Goal Participant will see blood pressure controlled within the values of 140/14m/Hg or within value directed by their physician.   Intervention Provide nutrition & aerobic exercise along with prescribed medications to achieve BP 140/90 or less.   Stress Yes   Goal To meet with psychosocial counselor for stress and relaxation information and guidance. To state understanding of performing relaxation techniques and or identifying personal stressors.   Intervention Provide education on types of stress, identifiying stressors, and ways to cope with stress. Provide demonstration and active practice of relaxation techniques.      Personal Goals and Risk Factors Review:      Goals and Risk Factor Review      02/07/15 1644           Increase Aerobic Exercise and Physical Activity   Goals Progress/Improvement seen  Yes       Comments ERuthellis able to do level 4 on recumbent ellipcitcl (XR6000) with no problems. EReanasaid she wants to get her strength back so she can just do her housework even without getting tired.        Hypertension   Goal --  Stable blood pressure today.       Stress   Goal --  Has met with our CTerrellCounselor.          Personal Goals Discharge (Final Personal Goals and Risk Factors Review):      Goals and Risk Factor Review - 02/07/15  1644    Increase Aerobic Exercise and Physical Activity   Goals Progress/Improvement seen  Yes   Comments EDonyettais able to do level 4 on recumbent ellipcitcl (XR6000) with no problems. EGuerlinesaid she wants to get her strength back so she can just do her housework even without getting tired.    Hypertension   Goal --  Stable blood pressure today.   Stress   Goal --  Has met with our CIvanhoeCounselor.       Comments: ESuzannsaid she doesn't want to meet individually with the registered dietician.

## 2015-02-07 NOTE — Progress Notes (Signed)
Daily Session Note  Patient Details  Name: Christie Carpenter MRN: [id number] Date of Birth: 04-07-1946 Referring Provider:  Ellamae Sia, MD  Encounter Date: 02/07/2015  Check In:     Session Check In - 02/07/15 1634    Check-In   Staff Present Lestine Box BS, ACSM EP-C, Exercise Physiologist;Carroll Enterkin RN, BSN;Diane Joya Gaskins RN, BSN   ER physicians immediately available to respond to emergencies See telemetry face sheet for immediately available ER MD   Medication changes reported     No   Fall or balance concerns reported    No   Warm-up and Cool-down Performed on first and last piece of equipment   VAD Patient? No   Pain Assessment   Currently in Pain? No/denies         Goals Met:  Proper associated with RPD/PD & O2 Sat Exercise tolerated well No report of cardiac concerns or symptoms Strength training completed today  Goals Unmet:  Not Applicable  Goals Comments:    Dr. Emily Filbert is Medical Director for [place] [facility].

## 2015-02-11 ENCOUNTER — Encounter: Payer: Self-pay | Admitting: *Deleted

## 2015-02-13 ENCOUNTER — Encounter: Payer: Medicare Other | Attending: Internal Medicine

## 2015-02-13 DIAGNOSIS — Z9889 Other specified postprocedural states: Secondary | ICD-10-CM | POA: Insufficient documentation

## 2015-02-19 ENCOUNTER — Encounter: Payer: Self-pay | Admitting: *Deleted

## 2015-02-19 DIAGNOSIS — Z9889 Other specified postprocedural states: Secondary | ICD-10-CM

## 2015-02-19 NOTE — Progress Notes (Signed)
Cardiac Individual Treatment Plan  Patient Details  Name: Christie Carpenter MRN: [id number] Date of Birth: [birth year] Referring Provider:  Ellamae Sia, MD  Initial Encounter Date:    Visit Diagnosis: S/P MVR (mitral valve repair)  Patient's Home Medications on Admission:  Current outpatient prescriptions:  .  acetaminophen (TYLENOL) 325 MG tablet, Take 2 tablets by mouth every 6 (six) hours., Disp: , Rfl:  .  apixaban (ELIQUIS) 5 MG TABS tablet, Take 5 mg by mouth 2 (two) times daily., Disp: , Rfl:  .  atorvastatin (LIPITOR) 80 MG tablet, Take 1 tablet by mouth daily., Disp: , Rfl:  .  ferrous sulfate 325 (65 FE) MG tablet, Take 1 tablet by mouth daily., Disp: , Rfl:  .  furosemide (LASIX) 40 MG tablet, Take 40 mg by mouth daily., Disp: , Rfl:  .  gabapentin (NEURONTIN) 300 MG capsule, Take 1 capsule by mouth at bedtime., Disp: , Rfl:  .  levETIRAcetam (KEPPRA) 500 MG tablet, Take 1 tablet by mouth 2 (two) times daily., Disp: , Rfl:  .  levothyroxine (SYNTHROID, LEVOTHROID) 100 MCG tablet, Take 1 tablet by mouth every morning., Disp: , Rfl:  .  lisinopril (PRINIVIL,ZESTRIL) 2.5 MG tablet, Take 1 tablet by mouth daily., Disp: , Rfl:  .  metoprolol succinate (TOPROL-XL) 25 MG 24 hr tablet, Take 1 tablet by mouth daily., Disp: , Rfl:  .  potassium chloride SA (K-DUR,KLOR-CON) 20 MEQ tablet, Take 1 tablet by mouth daily., Disp: , Rfl:  .  senna-docusate (SENOKOT-S) 8.6-50 MG tablet, Take by mouth., Disp: , Rfl:  .  spironolactone (ALDACTONE) 25 MG tablet, Take 0.5 tablets by mouth daily., Disp: , Rfl:   Past Medical History: Past Medical History  Diagnosis Date  . CHF (congestive heart failure) ([place])   . Pulmonary hypertension ([place])   . Mitral valve problem   . Coronary artery disease   . Presence of permanent cardiac pacemaker   . Heart murmur   . Shortness of breath dyspnea   . Hypothyroidism   . Diabetes mellitus without complication ([place])   . Depression   . Cancer ([place]) 1987     non hodgkins lymphma    Tobacco Use: History  Smoking status  . Former Smoker  Smokeless tobacco  . 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 Participant demonstrates ability to understand and report RPE using BORG scale, to self-measure pulse accurately, and to acknowledge the importance of the exercise prescription.  Exercise Prescription Goal: Starting with aerobic activity 30 plus minutes a day, 3 days per week for initial exercise prescription. Provide home exercise prescription and guidelines that participant acknowledges understanding prior to discharge.  Activity Barriers & Risk Stratification:     Activity Barriers & Risk Stratification - 01/08/15 1508    Activity Barriers & Risk Stratification   Activity Barriers None   Risk Stratification High      6 Minute Walk:     6 Minute Walk      01/07/15 1514       6 Minute Walk   Phase Initial  Distance 1010 feet     Walk Time 6 minutes     Resting HR 96 bpm     Resting BP 112/80 mmHg     Max Ex. HR 106 bpm     Max Ex. BP 118/60 mmHg     RPE 15     Symptoms Yes (comment)     Comments Shortness of breath        Initial Exercise Prescription:     Initial Exercise Prescription - 01/07/15 1500    Date of Initial Exercise Prescription   Date 01/07/15   Treadmill   MPH 1.5   Grade 0   Minutes 15   Bike   Level 0.8   Minutes 15   Recumbant Bike   Level 2   Watts 20   Minutes 15   NuStep   Level 3   Watts 30   Minutes 15   Arm Ergometer   Level 1   Watts 8   Minutes 15   Arm/Foot Ergometer   Level 1   Watts 10   Minutes 15   Cybex    Level 1   RPM 40   Minutes 15   Recumbant Elliptical   Level 1   Watts 20   Minutes 15   Elliptical   Level 1   Speed 2   Minutes 5   REL-XR   Level 2   Watts 20   Minutes 15   Prescription Details   Frequency (times per week) 3   Duration Progress to 30 minutes of continuous aerobic without signs/symptoms of physical distress   Intensity   THRR REST +  30   Ratings of Perceived Exertion 11-15   Progression Continue progressive overload as per policy without signs/symptoms or physical distress.   Resistance Training   Training Prescription Yes   Weight 2   Reps 10-12      Exercise Prescription Changes:     Exercise Prescription Changes      01/15/15 1500 02/11/15 1200         Exercise Review   Progression No  Has not started exercise yet, medical review 01/07/15 Yes      Response to Exercise   Blood Pressure (Admit)  114/70 mmHg      Blood Pressure (Exercise)  126/64 mmHg      Blood Pressure (Exit)  122/70 mmHg      Heart Rate (Admit)  88 bpm      Heart Rate (Exercise)  105 bpm      Heart Rate (Exit)  99 bpm      Rating of Perceived Exertion (Exercise)  13      Symptoms  None      Comments  Reviewed individualized exercise prescription and made increases per departmental policy. Exercise increases were discussed with the patient and they were able to perform the new work loads without issue (no signs or symptoms).       Duration  Progress to 30 minutes of continuous aerobic without signs/symptoms of physical distress      Intensity  Rest + 30      Progression  Continue progressive overload as per policy without signs/symptoms or physical distress.      Resistance Training   Training Prescription  Yes      Weight  2      Reps  10-15      Interval Training   Interval Training  No      NuStep  Level  3      Watts  25      Minutes  25      REL-XR   Level  4      Watts  50      Minutes  15         Discharge Exercise Prescription (Final Exercise  Prescription Changes):     Exercise Prescription Changes - 02/11/15 1200    Exercise Review   Progression Yes   Response to Exercise   Blood Pressure (Admit) 114/70 mmHg   Blood Pressure (Exercise) 126/64 mmHg   Blood Pressure (Exit) 122/70 mmHg   Heart Rate (Admit) 88 bpm   Heart Rate (Exercise) 105 bpm   Heart Rate (Exit) 99 bpm   Rating of Perceived Exertion (Exercise) 13   Symptoms None   Comments Reviewed individualized exercise prescription and made increases per departmental policy. Exercise increases were discussed with the patient and they were able to perform the new work loads without issue (no signs or symptoms).    Duration Progress to 30 minutes of continuous aerobic without signs/symptoms of physical distress   Intensity Rest + 30   Progression Continue progressive overload as per policy without signs/symptoms or physical distress.   Resistance Training   Training Prescription Yes   Weight 2   Reps 10-15   Interval Training   Interval Training No   NuStep   Level 3   Watts 25   Minutes 25   REL-XR   Level 4   Watts 50   Minutes 15      Nutrition:  Target Goals: Understanding of nutrition guidelines, daily intake of sodium <153m, cholesterol <2079m calories 30% from fat and 7% or less from saturated fats, daily to have 5 or more servings of fruits and vegetables.  Biometrics:     Pre Biometrics - 01/07/15 1518    Pre Biometrics   Height '5\' 6"'  (1.676 m)   Weight 165 lb (74.844 kg)   Waist Circumference 38 inches   Hip Circumference 42 inches   Waist to Hip Ratio 0.9 %   BMI (Calculated) 26.7       Nutrition Therapy Plan and Nutrition Goals:     Nutrition Therapy & Goals - 01/08/15 1512    Nutrition Therapy   Drug/Food Interactions Statins/Certain Fruits   Intervention Plan   Intervention Using nutrition plan and personal goals to gain a healthy nutrition lifestyle. Add exercise as prescribed.      Nutrition Discharge: Rate Your Plate  Scores:   Nutrition Goals Re-Evaluation:     Nutrition Goals Re-Evaluation      02/07/15 1642           Personal Goal #1 Re-Evaluation   Personal Goal #1 ElAccaliarefers not to meet individually with the dietician. She said her doctor told her she can have 200048mf sodium a day and a "can of soup" has 1500m56m      Goal Progress Seen Yes          Psychosocial: Target Goals: Acknowledge presence or absence of depression, maximize coping skills, provide positive support system. Participant is able to verbalize types and ability to use techniques and skills needed for reducing stress and depression.  Initial Review & Psychosocial Screening:     Initial Psych Review & Screening - 01/07/15 1537    Initial Review   Current issues with Current Depression;Current Stress Concerns   Family Dynamics   Good Support System? Yes  Screening Interventions   Interventions Encouraged to exercise;Program counselor consult;Other (comment)   Comments Petrita Blunck stated she is depressed and has "tried medicine and counseling before and it didn't help. Anyone would be depressed if they had gone through what I have in the past 10 years. I took care of my husband for 5 years who was paralzyed on one side with a stroke. My daughter also had the same type of cancer 6 years ago and we moved up to [facility] to help take care of her 3 /4 children while she was receiving treatments. 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 I encouraged her to exercise and will consult our Cardiac REhab mental health counselor.       Quality of Life Scores:     Quality of Life - 01/07/15 1513    Quality of Life Scores   Health/Function Pre 17.14 %   Socioeconomic Pre 19.83 %   Psych/Spiritual Pre 15.43 %   Family Pre 25.5 %   GLOBAL Pre 18.35 %      PHQ-9:     Recent Review Flowsheet Data    Depression screen [facility] 2/9 01/07/2015   Decreased Interest 3   Down, Depressed, Hopeless 2   PHQ - 2 Score 5    Altered sleeping 1   Tired, decreased energy 2   Change in appetite 3   Feeling bad or failure about yourself  1   Trouble concentrating 1   Moving slowly or fidgety/restless 1   Suicidal thoughts 1   PHQ-9 Score 15   Difficult doing work/chores Somewhat difficult      Psychosocial Evaluation and Intervention:     Psychosocial Evaluation - 02/07/15 1646    Psychosocial Evaluation & Interventions   Continued Psychosocial Services Needed Yes      Psychosocial Re-Evaluation:   Vocational Rehabilitation: Provide vocational rehab assistance to qualifying candidates.   Vocational Rehab Evaluation & Intervention:     Vocational Rehab - 01/07/15 1535    Initial Vocational Rehab Evaluation & Intervention   Assessment shows need for Vocational Rehabilitation No      Education: Education Goals: Education classes will be provided on a weekly basis, covering required topics. Participant will state understanding/return demonstration of topics presented.  Learning Barriers/Preferences:     Learning Barriers/Preferences - 01/08/15 1509    Learning Barriers/Preferences   Learning Barriers None   Learning Preferences None      Education Topics: General Nutrition Guidelines/Fats and Fiber: -Group instruction provided by verbal, written material, models and posters to present the general guidelines for heart healthy nutrition. Gives an explanation and review of dietary fats and fiber.   Controlling Sodium/Reading Food Labels: -Group verbal and written material supporting the discussion of sodium use in heart healthy nutrition. Review and explanation with models, verbal and written materials for utilization of the food label.   Exercise Physiology & Risk Factors: - Group verbal and written instruction with models to review the exercise physiology of the cardiovascular system and associated critical values. Details cardiovascular disease risk factors and the goals associated with  each risk factor.   Aerobic Exercise & Resistance Training: - Gives group verbal and written discussion on the health impact of inactivity. On the components of aerobic and resistive training programs and the benefits of this training and how to safely progress through these programs.   Flexibility, Balance, General Exercise Guidelines: - Provides group verbal and written instruction on the benefits of flexibility and balance training  programs. Provides general exercise guidelines with specific guidelines to those with heart or lung disease. Demonstration and skill practice provided.   Stress Management: - Provides group verbal and written instruction about the health risks of elevated stress, cause of high stress, and healthy ways to reduce stress.   Depression: - Provides group verbal and written instruction on the correlation between heart/lung disease and depressed mood, treatment options, and the stigmas associated with seeking treatment.          Cardiac Rehab from 02/06/2015 in [facility] Cardiac Rehab   Date  02/06/15   Educator  [facility]   Instruction Review Code  2- meets goals/outcomes      Anatomy & Physiology of the Heart: - Group verbal and written instruction and models provide basic cardiac anatomy and physiology, with the coronary electrical and arterial systems. Review of: AMI, Angina, Valve disease, Heart Failure, Cardiac Arrhythmia, Pacemakers, and the ICD.   Cardiac Procedures: - Group verbal and written instruction and models to describe the testing methods done to diagnose heart disease. Reviews the outcomes of the test results. Describes the treatment choices: Medical Management, Angioplasty, or Coronary Bypass Surgery.   Cardiac Medications: - Group verbal and written instruction to review commonly prescribed medications for heart disease. Reviews the medication, class of the drug, and side effects. Includes the steps to properly store meds and maintain the  prescription regimen.   Go Sex-Intimacy & Heart Disease, Get SMART - Goal Setting: - Group verbal and written instruction through game format to discuss heart disease and the return to sexual intimacy. Provides group verbal and written material to discuss and apply goal setting through the application of the S.M.A.R.T. Method.   Other Matters of the Heart: - Provides group verbal, written materials and models to describe Heart Failure, Angina, Valve Disease, and Diabetes in the realm of heart disease. Includes description of the disease process and treatment options available to the cardiac patient.   Exercise & Equipment Safety: - Individual verbal instruction and demonstration of equipment use and safety with use of the equipment.      Cardiac Rehab from 02/06/2015 in [facility] Cardiac Rehab   Date  01/07/15   Educator  C. Enterkin,RN   Instruction Review Code  1- partially meets, needs review/practice      Infection Prevention: - Provides verbal and written material to individual with discussion of infection control including proper hand washing and proper equipment cleaning during exercise session.      Cardiac Rehab from 02/06/2015 in [facility] Cardiac Rehab   Date  01/07/15   Educator  kin,RN   Instruction Review Code  2- meets goals/outcomes      Falls Prevention: - Provides verbal and written material to individual with discussion of falls prevention and safety.      Cardiac Rehab from 02/06/2015 in [facility] Cardiac Rehab   Date  01/07/15   Educator  C. Enterkin   Instruction Review Code  2- meets goals/outcomes      Diabetes: - Individual verbal and written instruction to review signs/symptoms of diabetes, desired ranges of glucose level fasting, after meals and with exercise. Advice that pre and post exercise glucose checks will be done for 3 sessions at entry of program.    Knowledge Questionnaire Score:     Knowledge Questionnaire Score - 01/07/15 1510    Knowledge  Questionnaire Score   Pre Score 21      Personal Goals and Risk Factors at Admission:     Personal Goals  and Risk Factors at Admission - 01/07/15 1804    Personal Goals and Risk Factors on Admission   Increase Aerobic Exercise and Physical Activity Yes   Intervention While in program, learn and follow the exercise prescription taught. Start at a low level workload and increase workload after able to maintain previous level for 30 minutes. Increase time before increasing intensity.   Take Less Medication Yes   Intervention Learn your risk factors and begin the lifestyle modifications for risk factor control during your time in the program.   Diabetes No   Hypertension Yes   Goal Participant will see blood pressure controlled within the values of 140/81m/Hg or within value directed by their physician.   Intervention Provide nutrition & aerobic exercise along with prescribed medications to achieve BP 140/90 or less.   Stress Yes   Goal To meet with psychosocial counselor for stress and relaxation information and guidance. To state understanding of performing relaxation techniques and or identifying personal stressors.   Intervention Provide education on types of stress, identifiying stressors, and ways to cope with stress. Provide demonstration and active practice of relaxation techniques.      Personal Goals and Risk Factors Review:      Goals and Risk Factor Review      02/07/15 1644           Increase Aerobic Exercise and Physical Activity   Goals Progress/Improvement seen  Yes       Comments EAvrianais able to do level 4 on recumbent ellipcitcl (XR6000) with no problems. EMarnaesaid she wants to get her strength back so she can just do her housework even without getting tired.        Hypertension   Goal --  Stable blood pressure today.       Stress   Goal --  Has met with our [place]sel[place].          Personal Goals Discharge (Final Personal Goals and  Risk Factors Review):      Goals and Risk Factor Review - 02/07/15 1644    Increase Aerobic Exercise and Physical Activity   Goals Progress/Improvement seen  Yes   Comments EFarrahis able to do level 4 on recumbent ellipcitcl (XR6000) with no problems. EWinifredsaid she wants to get her strength back so she can just do her housework even without getting tired.    Hypertension   Goal --  Stable blood pressure today.   Stress   Goal --  [place] met [place] our [place].       Comments: 30 day review. Continue with ITP.

## 2015-02-20 ENCOUNTER — Other Ambulatory Visit: Payer: Self-pay | Admitting: *Deleted

## 2015-02-20 DIAGNOSIS — Z9889 Other specified postprocedural states: Secondary | ICD-10-CM

## 2015-03-12 ENCOUNTER — Encounter: Payer: Self-pay | Admitting: *Deleted

## 2015-03-13 ENCOUNTER — Telehealth: Payer: Self-pay | Admitting: *Deleted

## 2015-03-13 ENCOUNTER — Encounter: Payer: Self-pay | Admitting: *Deleted

## 2015-03-13 NOTE — Telephone Encounter (Signed)
I called and left a vm on the cell phone after talking to her sister who said Rayli had gout. I left a vm asking Kayde to let us know either way her plans.

## 2015-03-13 NOTE — Progress Notes (Signed)
Cardiac Individual Treatment Plan  Patient Details  Name: Christie Carpenter MRN: [id number] Date of Birth: Sep 08, 1945 Referring Provider:  No ref. provider found  Initial Encounter Date:    Visit Diagnosis: No diagnosis found.  Patient's Home Medications on Admission:  Current outpatient prescriptions:  .  acetaminophen (TYLENOL) 325 MG tablet, Take 2 tablets by mouth every 6 (six) hours., Disp: , Rfl:  .  apixaban (ELIQUIS) 5 MG TABS tablet, Take 5 mg by mouth 2 (two) times daily., Disp: , Rfl:  .  atorvastatin (LIPITOR) 80 MG tablet, Take 1 tablet by mouth daily., Disp: , Rfl:  .  ferrous sulfate 325 (65 FE) MG tablet, Take 1 tablet by mouth daily., Disp: , Rfl:  .  furosemide (LASIX) 40 MG tablet, Take 40 mg by mouth daily., Disp: , Rfl:  .  gabapentin (NEURONTIN) 300 MG capsule, Take 1 capsule by mouth at bedtime., Disp: , Rfl:  .  levETIRAcetam (KEPPRA) 500 MG tablet, Take 1 tablet by mouth 2 (two) times daily., Disp: , Rfl:  .  levothyroxine (SYNTHROID, LEVOTHROID) 100 MCG tablet, Take 1 tablet by mouth every morning., Disp: , Rfl:  .  lisinopril (PRINIVIL,ZESTRIL) 2.5 MG tablet, Take 1 tablet by mouth daily., Disp: , Rfl:  .  metoprolol succinate (TOPROL-XL) 25 MG 24 hr tablet, Take 1 tablet by mouth daily., Disp: , Rfl:  .  potassium chloride SA (K-DUR,KLOR-CON) 20 MEQ tablet, Take 1 tablet by mouth daily., Disp: , Rfl:  .  senna-docusate (SENOKOT-S) 8.6-50 MG tablet, Take by mouth., Disp: , Rfl:  .  spironolactone (ALDACTONE) 25 MG tablet, Take 0.5 tablets by mouth daily., Disp: , Rfl:   Past Medical History: Past Medical History  Diagnosis Date  . CHF (congestive heart failure) ([place])   . Pulmonary hypertension ([place])   . Mitral valve problem   . Coronary artery disease   . Presence of permanent cardiac pacemaker   . Heart murmur   . Shortness of breath dyspnea   . Hypothyroidism   . Diabetes mellitus without complication ([place])   . Depression   . 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 Former Smoker  Smokeless tobacco  . 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 Participant demonstrates ability to understand and report RPE using BORG scale, to self-measure pulse accurately, and to acknowledge the importance of the exercise prescription.  Exercise Prescription Goal: Starting with aerobic activity 30 plus minutes a day, 3 days per week for initial exercise prescription. Provide home exercise prescription and guidelines that participant acknowledges understanding prior to discharge.  Activity Barriers & Risk Stratification:     Activity Barriers & Risk Stratification - 01/08/15 1508    Activity Barriers & Risk Stratification   Activity Barriers None   Risk Stratification High      6 Minute Walk:     6 Minute Walk      01/07/15 1514       6 Minute Walk   Phase Initial  Distance 1010 feet     Walk Time 6 minutes     Resting HR 96 bpm     Resting BP 112/80 mmHg     Max Ex. HR 106 bpm     Max Ex. BP 118/60 mmHg     RPE 15     Symptoms Yes (comment)     Comments Shortness of breath        Initial Exercise Prescription:     Initial Exercise Prescription - 01/07/15 1500    Date of Initial Exercise Prescription   Date 01/07/15   Treadmill   MPH 1.5   Grade 0   Minutes 15   Bike   Level 0.8   Minutes 15   Recumbant Bike   Level 2   Watts 20   Minutes 15   NuStep   Level 3   Watts 30   Minutes 15   Arm Ergometer   Level 1   Watts 8   Minutes 15   Arm/Foot Ergometer   Level 1   Watts 10   Minutes 15   Cybex   Level  1   RPM 40   Minutes 15   Recumbant Elliptical   Level 1   Watts 20   Minutes 15   Elliptical   Level 1   Speed 2   Minutes 5   REL-XR   Level 2   Watts 20   Minutes 15   Prescription Details   Frequency (times per week) 3   Duration Progress to 30 minutes of continuous aerobic without signs/symptoms of physical distress   Intensity   THRR REST +  30   Ratings of Perceived Exertion 11-15   Progression Continue progressive overload as per policy without signs/symptoms or physical distress.   Resistance Training   Training Prescription Yes   Weight 2   Reps 10-12      Exercise Prescription Changes:     Exercise Prescription Changes      01/15/15 1500 02/11/15 1200 03/12/15 0800       Exercise Review   Progression No  Has not started exercise yet, medical review 01/07/15 Yes No  Absent since last review; last visit 02/07/15     Response to Exercise   Blood Pressure (Admit)  114/70 mmHg      Blood Pressure (Exercise)  126/64 mmHg      Blood Pressure (Exit)  122/70 mmHg      Heart Rate (Admit)  88 bpm      Heart Rate (Exercise)  105 bpm      Heart Rate (Exit)  99 bpm      Rating of Perceived Exertion (Exercise)  13      Symptoms  None      Comments  Reviewed individualized exercise prescription and made increases per departmental policy. Exercise increases were discussed with the patient and they were able to perform the new work loads without issue (no signs or symptoms).  No increases until returns and regularly attends exercise sessions.     Duration  Progress to 30 minutes of continuous aerobic without signs/symptoms of physical distress Progress to 30 minutes of continuous aerobic without signs/symptoms of physical distress     Intensity  Rest + 30 Rest + 30     Progression  Continue progressive overload as per policy without signs/symptoms or physical distress. Continue progressive overload as per policy without signs/symptoms or physical distress.     Cytogeneticist  Prescription  Yes Yes     Weight  2 2     Reps  10-15 10-15     Interval Training   Interval Training  No No     NuStep   Level  3 3     Watts  25 25     Minutes  25 25     REL-XR   Level  4 4     Watts  50 50     Minutes  15 15        Discharge Exercise Prescription (Final Exercise Prescription Changes):     Exercise Prescription Changes - 03/12/15 0800    Exercise Review   Progression No  Absent since last review; last visit 02/07/15   Response to Exercise   Comments No increases until returns and regularly attends exercise sessions.   Duration Progress to 30 minutes of continuous aerobic without signs/symptoms of physical distress   Intensity Rest + 30   Progression Continue progressive overload as per policy without signs/symptoms or physical distress.   Resistance Training   Training Prescription Yes   Weight 2   Reps 10-15   Interval Training   Interval Training No   NuStep   Level 3   Watts 25   Minutes 25   REL-XR   Level 4   Watts 50   Minutes 15      Nutrition:  Target Goals: Understanding of nutrition guidelines, daily intake of sodium <1550m, cholesterol <2025m calories 30% from fat and 7% or less from saturated fats, daily to have 5 or more servings of fruits and vegetables.  Biometrics:     Pre Biometrics - 01/07/15 1518    Pre Biometrics   Height '5\' 6"'  (1.676 m)   Weight 165 lb (74.844 kg)   Waist Circumference 38 inches   Hip Circumference 42 inches   Waist to Hip Ratio 0.9 %   BMI (Calculated) 26.7       Nutrition Therapy Plan and Nutrition Goals:     Nutrition Therapy & Goals - 01/08/15 1512    Nutrition Therapy   Drug/Food Interactions Statins/Certain Fruits   Intervention Plan   Intervention Using nutrition plan and personal goals to gain a healthy nutrition lifestyle. Add exercise as prescribed.      Nutrition Discharge: Rate Your Plate Scores:   Nutrition Goals Re-Evaluation:     Nutrition  Goals Re-Evaluation      02/07/15 1642           Personal Goal #1 Re-Evaluation   Personal Goal #1 ElSerafinarefers not to meet individually with the dietician. She said her doctor told her she can have 200074mf sodium a day and a "can of soup" has 1500m30m      Goal Progress Seen Yes          Psychosocial: Target Goals: Acknowledge presence or absence of depression, maximize coping skills, provide positive support system. Participant is able to verbalize types and ability to use techniques and skills needed for reducing stress and depression.  Initial Review & Psychosocial Screening:     Initial Psych Review & Screening - 01/07/15 1537    Initial Review   Current issues with Current Depression;Current Stress Concerns   Family Dynamics   Good Support System? Yes   Screening Interventions   Interventions Encouraged to exercise;Program counselor consult;Other (comment)   Comments EllaAhmyah Gidleyted she is depressed and has "tried medicine and counseling before and it didn't help.  Anyone would be depressed if they had gone through what I have in the past 10 years. I took care of my husband for 5 years who was paralzyed on one side with a stroke. My daughter also had the same type of cancer 6 years ago and we moved up to [facility] to help take care of her 3 /4 children while she was receiving treatments. 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 I encouraged her to exercise and will consult our Cardiac REhab mental health counselor.       Quality of Life Scores:     Quality of Life - 01/07/15 1513    Quality of Life Scores   Health/Function Pre 17.14 %   Socioeconomic Pre 19.83 %   Psych/Spiritual Pre 15.43 %   Family Pre 25.5 %   GLOBAL Pre 18.35 %      PHQ-9:     Recent Review Flowsheet Data    Depression screen [facility] 2/9 01/07/2015   Decreased Interest 3   Down, Depressed, Hopeless 2   PHQ - 2 Score 5   Altered sleeping 1   Tired, decreased energy 2   Change in  appetite 3   Feeling bad or failure about yourself  1   Trouble concentrating 1   Moving slowly or fidgety/restless 1   Suicidal thoughts 1   PHQ-9 Score 15   Difficult doing work/chores Somewhat difficult      Psychosocial Evaluation and Intervention:     Psychosocial Evaluation - 02/07/15 1646    Psychosocial Evaluation & Interventions   Continued Psychosocial Services Needed Yes      Psychosocial Re-Evaluation:   Vocational Rehabilitation: Provide vocational rehab assistance to qualifying candidates.   Vocational Rehab Evaluation & Intervention:     Vocational Rehab - 01/07/15 1535    Initial Vocational Rehab Evaluation & Intervention   Assessment shows need for Vocational Rehabilitation No      Education: Education Goals: Education classes will be provided on a weekly basis, covering required topics. Participant will state understanding/return demonstration of topics presented.  Learning Barriers/Preferences:     Learning Barriers/Preferences - 01/08/15 1509    Learning Barriers/Preferences   Learning Barriers None   Learning Preferences None      Education Topics: General Nutrition Guidelines/Fats and Fiber: -Group instruction provided by verbal, written material, models and posters to present the general guidelines for heart healthy nutrition. Gives an explanation and review of dietary fats and fiber.   Controlling Sodium/Reading Food Labels: -Group verbal and written material supporting the discussion of sodium use in heart healthy nutrition. Review and explanation with models, verbal and written materials for utilization of the food label.   Exercise Physiology & Risk Factors: - Group verbal and written instruction with models to review the exercise physiology of the cardiovascular system and associated critical values. Details cardiovascular disease risk factors and the goals associated with each risk factor.   Aerobic Exercise & Resistance  Training: - Gives group verbal and written discussion on the health impact of inactivity. On the components of aerobic and resistive training programs and the benefits of this training and how to safely progress through these programs.   Flexibility, Balance, General Exercise Guidelines: - Provides group verbal and written instruction on the benefits of flexibility and balance training programs. Provides general exercise guidelines with specific guidelines to those with heart or lung disease. Demonstration and skill practice provided.   Stress Management: - Provides group verbal and written instruction  about the health risks of elevated stress, cause of high stress, and healthy ways to reduce stress.   Depression: - Provides group verbal and written instruction on the correlation between heart/lung disease and depressed mood, treatment options, and the stigmas associated with seeking treatment.          Cardiac Rehab from 02/06/2015 in [facility]   Date  02/06/15   Educator  [facility]   Instruction Review Code  2- meets goals/outcomes      Anatomy & Physiology of the Heart: - Group verbal and written instruction and models provide basic cardiac anatomy and physiology, with the coronary electrical and arterial systems. Review of: AMI, Angina, Valve disease, Heart Failure, Cardiac Arrhythmia, Pacemakers, and the ICD.   Cardiac Procedures: - Group verbal and written instruction and models to describe the testing methods done to diagnose heart disease. Reviews the outcomes of the test results. Describes the treatment choices: Medical Management, Angioplasty, or Coronary Bypass Surgery.   Cardiac Medications: - Group verbal and written instruction to review commonly prescribed medications for heart disease. Reviews the medication, class of the drug, and side effects. Includes the steps to properly store meds and maintain the prescription regimen.   Go Sex-Intimacy & Heart Disease, Get  SMART - Goal Setting: - Group verbal and written instruction through game format to discuss heart disease and the return to sexual intimacy. Provides group verbal and written material to discuss and apply goal setting through the application of the S.M.A.R.T. Method.   Other Matters of the Heart: - Provides group verbal, written materials and models to describe Heart Failure, Angina, Valve Disease, and Diabetes in the realm of heart disease. Includes description of the disease process and treatment options available to the cardiac patient.   Exercise & Equipment Safety: - Individual verbal instruction and demonstration of equipment use and safety with use of the equipment.      Cardiac Rehab from 02/06/2015 in [facility]   Date  01/07/15   Educator  C. Jerimyah Vandunk,RN   Instruction Review Code  1- partially meets, needs review/practice      Infection Prevention: - Provides verbal and written material to individual with discussion of infection control including proper hand washing and proper equipment cleaning during exercise session.      Cardiac Rehab from 02/06/2015 in [facility]   Date  01/07/15   Educator  kin,RN   Instruction Review Code  2- meets goals/outcomes      Falls Prevention: - Provides verbal and written material to individual with discussion of falls prevention and safety.      Cardiac Rehab from 02/06/2015 in [facility]   Date  01/07/15   Educator  C. Trystan Eads   Instruction Review Code  2- meets goals/outcomes      Diabetes: - Individual verbal and written instruction to review signs/symptoms of diabetes, desired ranges of glucose level fasting, after meals and with exercise. Advice that pre and post exercise glucose checks will be done for 3 sessions at entry of program.    Knowledge Questionnaire Score:     Knowledge Questionnaire Score - 01/07/15 1510    Knowledge Questionnaire Score   Pre Score 21      Personal Goals and Risk  Factors at Admission:     Personal Goals and Risk Factors at Admission - 01/07/15 1804    Personal Goals and Risk Factors on Admission   Increase Aerobic Exercise and Physical Activity Yes   Intervention While  in program, learn and follow the exercise prescription taught. Start at a low level workload and increase workload after able to maintain previous level for 30 minutes. Increase time before increasing intensity.   Take Less Medication Yes   Intervention Learn your risk factors and begin the lifestyle modifications for risk factor control during your time in the program.   Diabetes No   Hypertension Yes   Goal Participant will see blood pressure controlled within the values of 140/33m/Hg or within value directed by their physician.   Intervention Provide nutrition & aerobic exercise along with prescribed medications to achieve BP 140/90 or less.   Stress Yes   Goal To meet with psychosocial counselor for stress and relaxation information and guidance. To state understanding of performing relaxation techniques and or identifying personal stressors.   Intervention Provide education on types of stress, identifiying stressors, and ways to cope with stress. Provide demonstration and active practice of relaxation techniques.      Personal Goals and Risk Factors Review:      Goals and Risk Factor Review      02/07/15 1644           Increase Aerobic Exercise and Physical Activity   Goals Progress/Improvement seen  Yes       Comments ELiseis able to do level 4 on recumbent ellipcitcl (XR6000) with no problems. EFrumasaid she wants to get her strength back so she can just do her housework even without getting tired.        Hypertension   Goal --  Stable blood pressure today.       Stress   Goal --  Has met with our CGlen RoseCounselor.          Personal Goals Discharge (Final Personal Goals and Risk Factors Review):      Goals and Risk Factor Review - 02/07/15  1644    Increase Aerobic Exercise and Physical Activity   Goals Progress/Improvement seen  Yes   Comments EWahnetais able to do level 4 on recumbent ellipcitcl (XR6000) with no problems. EShanikasaid she wants to get her strength back so she can just do her housework even without getting tired.    Hypertension   Goal --  Stable blood pressure today.   Stress   Goal --  Has met with our [place]os[place]Counselor.      ITP Comments:   Comments: I called and left a vm on the cell phone after talking to her sister who said EMicaelahad gout. I left a vm asking EKiyanato let uKoreaknow either way her plans.

## 2015-03-14 ENCOUNTER — Encounter: Payer: Medicare Other | Attending: Internal Medicine

## 2015-03-14 DIAGNOSIS — Z9889 Other specified postprocedural states: Secondary | ICD-10-CM | POA: Insufficient documentation

## 2015-03-19 NOTE — Progress Notes (Signed)
Cardiac Individual Treatment Plan  Patient Details  Name: Christie Carpenter MRN: [id number] Date of Birth: [birth year] Referring Provider:  Ellamae Sia, MD  Initial Encounter Date:    Visit Diagnosis: S/P MVR (mitral valve repair)  Patient's Home Medications on Admission:  Current outpatient prescriptions:  .  acetaminophen (TYLENOL) 325 MG tablet, Take 2 tablets by mouth every 6 (six) hours., Disp: , Rfl:  .  apixaban (ELIQUIS) 5 MG TABS tablet, Take 5 mg by mouth 2 (two) times daily., Disp: , Rfl:  .  atorvastatin (LIPITOR) 80 MG tablet, Take 1 tablet by mouth daily., Disp: , Rfl:  .  ferrous sulfate 325 (65 FE) MG tablet, Take 1 tablet by mouth daily., Disp: , Rfl:  .  furosemide (LASIX) 40 MG tablet, Take 40 mg by mouth daily., Disp: , Rfl:  .  gabapentin (NEURONTIN) 300 MG capsule, Take 1 capsule by mouth at bedtime., Disp: , Rfl:  .  levETIRAcetam (KEPPRA) 500 MG tablet, Take 1 tablet by mouth 2 (two) times daily., Disp: , Rfl:  .  levothyroxine (SYNTHROID, LEVOTHROID) 100 MCG tablet, Take 1 tablet by mouth every morning., Disp: , Rfl:  .  lisinopril (PRINIVIL,ZESTRIL) 2.5 MG tablet, Take 1 tablet by mouth daily., Disp: , Rfl:  .  metoprolol succinate (TOPROL-XL) 25 MG 24 hr tablet, Take 1 tablet by mouth daily., Disp: , Rfl:  .  potassium chloride SA (K-DUR,KLOR-CON) 20 MEQ tablet, Take 1 tablet by mouth daily., Disp: , Rfl:  .  senna-docusate (SENOKOT-S) 8.6-50 MG tablet, Take by mouth., Disp: , Rfl:  .  spironolactone (ALDACTONE) 25 MG tablet, Take 0.5 tablets by mouth daily., Disp: , Rfl:   Past Medical History: Past Medical History  Diagnosis Date  . CHF (congestive heart failure) ([place])   . Pulmonary hypertension ([place])   . Mitral valve problem   . Coronary artery disease   . Presence of permanent cardiac pacemaker   . Heart murmur   . Shortness of breath dyspnea   . Hypothyroidism   . Diabetes mellitus without complication ([place])   . Depression   . 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 Former Smoker  Smokeless tobacco  . 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 Participant demonstrates ability to understand and report RPE using BORG scale, to self-measure pulse accurately, and to acknowledge the importance of the exercise prescription.  Exercise Prescription Goal: Starting with aerobic activity 30 plus minutes a day, 3 days per week for initial exercise prescription. Provide home exercise prescription and guidelines that participant acknowledges understanding prior to discharge.  Activity Barriers & Risk Stratification:     Activity Barriers & Risk Stratification - 01/08/15 1508    Activity Barriers & Risk Stratification   Activity Barriers None   Risk Stratification High      6 Minute Walk:     6 Minute Walk      01/07/15 1514       6 Minute Walk   Phase Initial  Distance 1010 feet     Walk Time 6 minutes     Resting HR 96 bpm     Resting BP 112/80 mmHg     Max Ex. HR 106 bpm     Max Ex. BP 118/60 mmHg     RPE 15     Symptoms Yes (comment)     Comments Shortness of breath        Initial Exercise Prescription:     Initial Exercise Prescription - 01/07/15 1500    Date of Initial Exercise Prescription   Date 01/07/15   Treadmill   MPH 1.5   Grade 0   Minutes 15   Bike   Level 0.8   Minutes 15   Recumbant Bike   Level 2   Watts 20   Minutes 15   NuStep   Level 3   Watts 30   Minutes 15   Arm Ergometer   Level 1   Watts 8   Minutes 15   Arm/Foot Ergometer   Level 1   Watts 10   Minutes 15   Cybex    Level 1   RPM 40   Minutes 15   Recumbant Elliptical   Level 1   Watts 20   Minutes 15   Elliptical   Level 1   Speed 2   Minutes 5   REL-XR   Level 2   Watts 20   Minutes 15   Prescription Details   Frequency (times per week) 3   Duration Progress to 30 minutes of continuous aerobic without signs/symptoms of physical distress   Intensity   THRR REST +  30   Ratings of Perceived Exertion 11-15   Progression Continue progressive overload as per policy without signs/symptoms or physical distress.   Resistance Training   Training Prescription Yes   Weight 2   Reps 10-12      Exercise Prescription Changes:     Exercise Prescription Changes      01/15/15 1500 02/11/15 1200 03/12/15 0800       Exercise Review   Progression No  Has not started exercise yet, medical review 01/07/15 Yes No  Absent since last review; last visit 02/07/15     Response to Exercise   Blood Pressure (Admit)  114/70 mmHg      Blood Pressure (Exercise)  126/64 mmHg      Blood Pressure (Exit)  122/70 mmHg      Heart Rate (Admit)  88 bpm      Heart Rate (Exercise)  105 bpm      Heart Rate (Exit)  99 bpm      Rating of Perceived Exertion (Exercise)  13      Symptoms  None      Comments  Reviewed individualized exercise prescription and made increases per departmental policy. Exercise increases were discussed with the patient and they were able to perform the new work loads without issue (no signs or symptoms).  No increases until returns and regularly attends exercise sessions.     Duration  Progress to 30 minutes of continuous aerobic without signs/symptoms of physical distress Progress to 30 minutes of continuous aerobic without signs/symptoms of physical distress     Intensity  Rest + 30 Rest + 30     Progression  Continue progressive overload as per policy without signs/symptoms or physical distress. Continue progressive overload as per policy without signs/symptoms or physical distress.      Horticulturist, commercial  Prescription  Yes Yes     Weight  2 2     Reps  10-15 10-15     Interval Training   Interval Training  No No     NuStep   Level  3 3     Watts  25 25     Minutes  25 25     REL-XR   Level  4 4     Watts  50 50     Minutes  15 15        Discharge Exercise Prescription (Final Exercise Prescription Changes):     Exercise Prescription Changes - 03/12/15 0800    Exercise Review   Progression No  Absent since last review; last visit 02/07/15   Response to Exercise   Comments No increases until returns and regularly attends exercise sessions.   Duration Progress to 30 minutes of continuous aerobic without signs/symptoms of physical distress   Intensity Rest + 30   Progression Continue progressive overload as per policy without signs/symptoms or physical distress.   Resistance Training   Training Prescription Yes   Weight 2   Reps 10-15   Interval Training   Interval Training No   NuStep   Level 3   Watts 25   Minutes 25   REL-XR   Level 4   Watts 50   Minutes 15      Nutrition:  Target Goals: Understanding of nutrition guidelines, daily intake of sodium <1571m, cholesterol <2050m calories 30% from fat and 7% or less from saturated fats, daily to have 5 or more servings of fruits and vegetables.  Biometrics:     Pre Biometrics - 01/07/15 1518    Pre Biometrics   Height '5\' 6"'  (1.676 m)   Weight 165 lb (74.844 kg)   Waist Circumference 38 inches   Hip Circumference 42 inches   Waist to Hip Ratio 0.9 %   BMI (Calculated) 26.7       Nutrition Therapy Plan and Nutrition Goals:     Nutrition Therapy & Goals - 01/08/15 1512    Nutrition Therapy   Drug/Food Interactions Statins/Certain Fruits   Intervention Plan   Intervention Using nutrition plan and personal goals to gain a healthy nutrition lifestyle. Add exercise as prescribed.      Nutrition Discharge: Rate Your Plate Scores:   Nutrition Goals Re-Evaluation:      Nutrition Goals Re-Evaluation      02/07/15 1642           Personal Goal #1 Re-Evaluation   Personal Goal #1 Christie Carpenter not to meet individually with the dietician. She said her doctor told her she can have 200067mf sodium a day and a "can of soup" has 1500m6m      Goal Progress Seen Yes          Psychosocial: Target Goals: Acknowledge presence or absence of depression, maximize coping skills, provide positive support system. Participant is able to verbalize types and ability to use techniques and skills needed for reducing stress and depression.  Initial Review & Psychosocial Screening:     Initial Psych Review & Screening - 01/07/15 1537    Initial Review   Current issues with Current Depression;Current Stress Concerns   Family Dynamics   Good Support System? Yes   Screening Interventions   Interventions Encouraged to exercise;Program counselor consult;Other (comment)   Comments Christie Carpenter she is depressed and has "tried medicine and counseling before and it didn't help.  Anyone would be depressed if they had gone through what I have in the past 10 years. I took care of my husband for 5 years who was paralzyed on one side with a stroke. My daughter also had the same type of cancer 6 years ago and we moved up to [facility] to help take care of her 3 /4 children while she was receiving treatments. 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 I encouraged her to exercise and will consult our Cardiac REhab mental health counselor.       Quality of Life Scores:     Quality of Life - 01/07/15 1513    Quality of Life Scores   Health/Function Pre 17.14 %   Socioeconomic Pre 19.83 %   Psych/Spiritual Pre 15.43 %   Family Pre 25.5 %   GLOBAL Pre 18.35 %      PHQ-9:     Recent Review Flowsheet Data    Depression screen [facility] 2/9 01/07/2015   Decreased Interest 3   Down, Depressed, Hopeless 2   PHQ - 2 Score 5   Altered sleeping 1   Tired, decreased energy 2    Change in appetite 3   Feeling bad or failure about yourself  1   Trouble concentrating 1   Moving slowly or fidgety/restless 1   Suicidal thoughts 1   PHQ-9 Score 15   Difficult doing work/chores Somewhat difficult      Psychosocial Evaluation and Intervention:     Psychosocial Evaluation - 02/07/15 1646    Psychosocial Evaluation & Interventions   Continued Psychosocial Services Needed Yes      Psychosocial Re-Evaluation:   Vocational Rehabilitation: Provide vocational rehab assistance to qualifying candidates.   Vocational Rehab Evaluation & Intervention:     Vocational Rehab - 01/07/15 1535    Initial Vocational Rehab Evaluation & Intervention   Assessment shows need for Vocational Rehabilitation No      Education: Education Goals: Education classes will be provided on a weekly basis, covering required topics. Participant will state understanding/return demonstration of topics presented.  Learning Barriers/Preferences:     Learning Barriers/Preferences - 01/08/15 1509    Learning Barriers/Preferences   Learning Barriers None   Learning Preferences None      Education Topics: General Nutrition Guidelines/Fats and Fiber: -Group instruction provided by verbal, written material, models and posters to present the general guidelines for heart healthy nutrition. Gives an explanation and review of dietary fats and fiber.   Controlling Sodium/Reading Food Labels: -Group verbal and written material supporting the discussion of sodium use in heart healthy nutrition. Review and explanation with models, verbal and written materials for utilization of the food label.   Exercise Physiology & Risk Factors: - Group verbal and written instruction with models to review the exercise physiology of the cardiovascular system and associated critical values. Details cardiovascular disease risk factors and the goals associated with each risk factor.   Aerobic Exercise &  Resistance Training: - Gives group verbal and written discussion on the health impact of inactivity. On the components of aerobic and resistive training programs and the benefits of this training and how to safely progress through these programs.   Flexibility, Balance, General Exercise Guidelines: - Provides group verbal and written instruction on the benefits of flexibility and balance training programs. Provides general exercise guidelines with specific guidelines to those with heart or lung disease. Demonstration and skill practice provided.   Stress Management: - Provides group verbal and written instruction  about the health risks of elevated stress, cause of high stress, and healthy ways to reduce stress.   Depression: - Provides group verbal and written instruction on the correlation between heart/lung disease and depressed mood, treatment options, and the stigmas associated with seeking treatment.          Cardiac Rehab from 02/06/2015 in [facility] Cardiac Rehab   Date  02/06/15   Educator  [facility]   Instruction Review Code  2- meets goals/outcomes      Anatomy & Physiology of the Heart: - Group verbal and written instruction and models provide basic cardiac anatomy and physiology, with the coronary electrical and arterial systems. Review of: AMI, Angina, Valve disease, Heart Failure, Cardiac Arrhythmia, Pacemakers, and the ICD.   Cardiac Procedures: - Group verbal and written instruction and models to describe the testing methods done to diagnose heart disease. Reviews the outcomes of the test results. Describes the treatment choices: Medical Management, Angioplasty, or Coronary Bypass Surgery.   Cardiac Medications: - Group verbal and written instruction to review commonly prescribed medications for heart disease. Reviews the medication, class of the drug, and side effects. Includes the steps to properly store meds and maintain the prescription regimen.   Go Sex-Intimacy & Heart  Disease, Get SMART - Goal Setting: - Group verbal and written instruction through game format to discuss heart disease and the return to sexual intimacy. Provides group verbal and written material to discuss and apply goal setting through the application of the S.M.A.R.T. Method.   Other Matters of the Heart: - Provides group verbal, written materials and models to describe Heart Failure, Angina, Valve Disease, and Diabetes in the realm of heart disease. Includes description of the disease process and treatment options available to the cardiac patient.   Exercise & Equipment Safety: - Individual verbal instruction and demonstration of equipment use and safety with use of the equipment.      Cardiac Rehab from 02/06/2015 in [facility] Cardiac Rehab   Date  01/07/15   Educator  C. Enterkin,RN   Instruction Review Code  1- partially meets, needs review/practice      Infection Prevention: - Provides verbal and written material to individual with discussion of infection control including proper hand washing and proper equipment cleaning during exercise session.      Cardiac Rehab from 02/06/2015 in [facility] Cardiac Rehab   Date  01/07/15   Educator  kin,RN   Instruction Review Code  2- meets goals/outcomes      Falls Prevention: - Provides verbal and written material to individual with discussion of falls prevention and safety.      Cardiac Rehab from 02/06/2015 in [facility] Cardiac Rehab   Date  01/07/15   Educator  C. Enterkin   Instruction Review Code  2- meets goals/outcomes      Diabetes: - Individual verbal and written instruction to review signs/symptoms of diabetes, desired ranges of glucose level fasting, after meals and with exercise. Advice that pre and post exercise glucose checks will be done for 3 sessions at entry of program.    Knowledge Questionnaire Score:     Knowledge Questionnaire Score - 01/07/15 1510    Knowledge Questionnaire Score   Pre Score 21      Personal  Goals and Risk Factors at Admission:     Personal Goals and Risk Factors at Admission - 01/07/15 1804    Personal Goals and Risk Factors on Admission   Increase Aerobic Exercise and Physical Activity Yes   Intervention While  in program, learn and follow the exercise prescription taught. Start at a low level workload and increase workload after able to maintain previous level for 30 minutes. Increase time before increasing intensity.   Take Less Medication Yes   Intervention Learn your risk factors and begin the lifestyle modifications for risk factor control during your time in the program.   Diabetes No   Hypertension Yes   Goal Participant will see blood pressure controlled within the values of 140/39m/Hg or within value directed by their physician.   Intervention Provide nutrition & aerobic exercise along with prescribed medications to achieve BP 140/90 or less.   Stress Yes   Goal To meet with psychosocial counselor for stress and relaxation information and guidance. To state understanding of performing relaxation techniques and or identifying personal stressors.   Intervention Provide education on types of stress, identifiying stressors, and ways to cope with stress. Provide demonstration and active practice of relaxation techniques.      Personal Goals and Risk Factors Review:      Goals and Risk Factor Review      02/07/15 1644           Increase Aerobic Exercise and Physical Activity   Goals Progress/Improvement seen  Yes       Comments Christie Carpenter able to do level 4 on recumbent ellipcitcl (XR6000) with no problems. Christie Carpenter she wants to get her strength back so she can just do her housework even without getting tired.        Hypertension   Goal --  Stable blood pressure today.       Stress   Goal --  Has met with our CPrattCounselor.          Personal Goals Discharge (Final Personal Goals and Risk Factors Review):      Goals and Risk Factor  Review - 02/07/15 1644    Increase Aerobic Exercise and Physical Activity   Goals Progress/Improvement seen  Yes   Comments Christie Carpenter able to do level 4 on recumbent ellipcitcl (XR6000) with no problems. Christie Carpenter she wants to get her strength back so she can just do her housework even without getting tired.    Hypertension   Goal --  Stable blood pressure today.   Stress   Goal --  Has met with our [place]Counselor.      ITP Comments:     ITP Comments      03/19/15 1029           ITP Comments 30 day review  Continue with ITP  Christie Carpenter been absent since 10/27          Comments: 30 day review  Continue with ITP Has been absent since 10/27 visit

## 2015-03-20 NOTE — Addendum Note (Signed)
Addended by: Lynford Humphrey on: 03/20/2015 11:11 AM   Modules accepted: Orders

## 2015-04-09 ENCOUNTER — Encounter: Payer: Self-pay | Admitting: *Deleted

## 2015-04-12 ENCOUNTER — Telehealth: Payer: Self-pay | Admitting: *Deleted

## 2015-04-12 ENCOUNTER — Encounter: Payer: Self-pay | Admitting: *Deleted

## 2015-04-12 DIAGNOSIS — Z9889 Other specified postprocedural states: Secondary | ICD-10-CM

## 2015-04-12 NOTE — Progress Notes (Signed)
Discharge Summary  Patient Details  Name: Christie Carpenter MRN: [id number] Date of Birth: 1945/07/03 Referring Provider:  Ellamae Sia, MD   Number of Visits: 4  Reason for Discharge:  Early Exit:  Personal health issues  Smoking History:  History  Smoking status  . Former Smoker  Smokeless tobacco  . Never Used    Diagnosis:  S/P MVR (mitral valve repair) - Plan: CARDIAC REHAB 30 DAY REVIEW  ADL UCSD:   Initial Exercise Prescription:     Initial Exercise Prescription - 01/07/15 1500    Date of Initial Exercise Prescription   Date 01/07/15   Treadmill   MPH 1.5   Grade 0   Minutes 15   Bike   Level 0.8   Minutes 15   Recumbant Bike   Level 2   Watts 20   Minutes 15   NuStep   Level 3   Watts 30   Minutes 15   Arm Ergometer   Level 1   Watts 8   Minutes 15   Arm/Foot Ergometer   Level 1   Watts 10   Minutes 15   Cybex   Level 1   RPM 40   Minutes 15   Recumbant Elliptical   Level 1   Watts 20   Minutes 15   Elliptical   Level 1   Speed 2   Minutes 5   REL-XR   Level 2   Watts 20   Minutes 15   Prescription Details   Frequency (times per week) 3   Duration Progress to 30 minutes of continuous aerobic without signs/symptoms of physical distress   Intensity   THRR REST +  30   Ratings of Perceived Exertion 11-15   Progression Continue progressive overload as per policy without signs/symptoms or physical distress.   Resistance Training   Training Prescription Yes   Weight 2   Reps 10-12      Discharge Exercise Prescription (Final Exercise Prescription Changes):     Exercise Prescription Changes - 04/09/15 0800    Exercise Review   Progression No  Absent since last review; last visit 02/07/15   Response to Exercise   Comments No increases until returns and regularly attends exercise sessions.   Duration Progress to 30 minutes of continuous aerobic without signs/symptoms of physical distress   Intensity Rest + 30   Progression  Continue progressive overload as per policy without signs/symptoms or physical distress.   Resistance Training   Training Prescription Yes   Weight 2   Reps 10-15   Interval Training   Interval Training No   NuStep   Level 3   Watts 25   Minutes 25   REL-XR   Level 4   Watts 50   Minutes 15      Functional Capacity:     6 Minute Walk      01/07/15 1514       6 Minute Walk   Phase Initial     Distance 1010 feet     Walk Time 6 minutes     Resting HR 96 bpm     Resting BP 112/80 mmHg     Max Ex. HR 106 bpm     Max Ex. BP 118/60 mmHg     RPE 15     Symptoms Yes (comment)     Comments Shortness of breath        Psychological, QOL, Others - Outcomes: PHQ 2/9: Depression screen [facility] 2/9 01/07/2015  Decreased Interest 3  Down, Depressed, Hopeless 2  PHQ - 2 Score 5  Altered sleeping 1  Tired, decreased energy 2  Change in appetite 3  Feeling bad or failure about yourself  1  Trouble concentrating 1  Moving slowly or fidgety/restless 1  Suicidal thoughts 1  PHQ-9 Score 15  Difficult doing work/chores Somewhat difficult    Quality of Life:     Quality of Life - 01/07/15 1513    Quality of Life Scores   Health/Function Pre 17.14 %   Socioeconomic Pre 19.83 %   Psych/Spiritual Pre 15.43 %   Family Pre 25.5 %   GLOBAL Pre 18.35 %      Personal Goals: Goals established at orientation with interventions provided to work toward goal.     Personal Goals and Risk Factors at Admission - 01/07/15 1804    Personal Goals and Risk Factors on Admission   Increase Aerobic Exercise and Physical Activity Yes   Intervention While in program, learn and follow the exercise prescription taught. Start at a low level workload and increase workload after able to maintain previous level for 30 minutes. Increase time before increasing intensity.   Take Less Medication Yes   Intervention Learn your risk factors and begin the lifestyle modifications for risk factor control  during your time in the program.   Diabetes No   Hypertension Yes   Goal Participant will see blood pressure controlled within the values of 140/70m/Hg or within value directed by their physician.   Intervention Provide nutrition & aerobic exercise along with prescribed medications to achieve BP 140/90 or less.   Stress Yes   Goal To meet with psychosocial counselor for stress and relaxation information and guidance. To state understanding of performing relaxation techniques and or identifying personal stressors.   Intervention Provide education on types of stress, identifiying stressors, and ways to cope with stress. Provide demonstration and active practice of relaxation techniques.       Personal Goals Discharge:     Goals and Risk Factor Review      02/07/15 1644           Increase Aerobic Exercise and Physical Activity   Goals Progress/Improvement seen  Yes       Comments EAlbirthais able to do level 4 on recumbent ellipcitcl (XR6000) with no problems. EFideliasaid she wants to get her strength back so she can just do her housework even without getting tired.        Hypertension   Goal --  Stable blood pressure today.       Stress   Goal --  Has met with our [place]selor.          Nutrition & Weight - Outcomes:     Pre Biometrics - 01/07/15 1518    Pre Biometrics   Height '5\' 6"'  (1.676 m)   Weight 165 lb (74.844 kg)   Waist Circumference 38 inches   Hip Circumference 42 inches   Waist to Hip Ratio 0.9 %   BMI (Calculated) 26.7       Nutrition:     Nutrition Therapy & Goals - 01/08/15 1512    Nutrition Therapy   Drug/Food Interactions Statins/Certain Fruits   Intervention Plan   Intervention Using nutrition plan and personal goals to gain a healthy nutrition lifestyle. Add exercise as prescribed.      Nutrition Discharge:   Education Questionnaire Score:     Knowledge Questionnaire Score - 01/07/15  1510    Knowledge Questionnaire  Score   Pre Score 21

## 2015-04-12 NOTE — Progress Notes (Signed)
Cardiac Individual Treatment Plan  Patient Details  Name: Christie Carpenter MRN: [id number] Date of Birth: [birth year] Referring Provider:  Ellamae Sia, MD  Initial Encounter Date:    Visit Diagnosis: S/P MVR (mitral valve repair)  Patient's Home Medications on Admission:  Current outpatient prescriptions:  .  acetaminophen (TYLENOL) 325 MG tablet, Take 2 tablets by mouth every 6 (six) hours., Disp: , Rfl:  .  apixaban (ELIQUIS) 5 MG TABS tablet, Take 5 mg by mouth 2 (two) times daily., Disp: , Rfl:  .  atorvastatin (LIPITOR) 80 MG tablet, Take 1 tablet by mouth daily., Disp: , Rfl:  .  ferrous sulfate 325 (65 FE) MG tablet, Take 1 tablet by mouth daily., Disp: , Rfl:  .  furosemide (LASIX) 40 MG tablet, Take 40 mg by mouth daily., Disp: , Rfl:  .  gabapentin (NEURONTIN) 300 MG capsule, Take 1 capsule by mouth at bedtime., Disp: , Rfl:  .  levETIRAcetam (KEPPRA) 500 MG tablet, Take 1 tablet by mouth 2 (two) times daily., Disp: , Rfl:  .  levothyroxine (SYNTHROID, LEVOTHROID) 100 MCG tablet, Take 1 tablet by mouth every morning., Disp: , Rfl:  .  lisinopril (PRINIVIL,ZESTRIL) 2.5 MG tablet, Take 1 tablet by mouth daily., Disp: , Rfl:  .  metoprolol succinate (TOPROL-XL) 25 MG 24 hr tablet, Take 1 tablet by mouth daily., Disp: , Rfl:  .  potassium chloride SA (K-DUR,KLOR-CON) 20 MEQ tablet, Take 1 tablet by mouth daily., Disp: , Rfl:  .  senna-docusate (SENOKOT-S) 8.6-50 MG tablet, Take by mouth., Disp: , Rfl:  .  spironolactone (ALDACTONE) 25 MG tablet, Take 0.5 tablets by mouth daily., Disp: , Rfl:   Past Medical History: Past Medical History  Diagnosis Date  . CHF (congestive heart failure) ([place])   . Pulmonary hypertension ([place])   . Mitral valve problem   . Coronary artery disease   . Presence of permanent cardiac pacemaker   . Heart murmur   . Shortness of breath dyspnea   . Hypothyroidism   . Diabetes mellitus without complication ([place])   . Depression   . Cancer ([place]) 1987     non hodgkins lymphma    Tobacco Use: History  Smoking status  . Former Smoker  Smokeless tobacco  . 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 Participant demonstrates ability to understand and report RPE using BORG scale, to self-measure pulse accurately, and to acknowledge the importance of the exercise prescription.  Exercise Prescription Goal: Starting with aerobic activity 30 plus minutes a day, 3 days per week for initial exercise prescription. Provide home exercise prescription and guidelines that participant acknowledges understanding prior to discharge.  Activity Barriers & Risk Stratification:     Activity Barriers & Risk Stratification - 01/08/15 1508    Activity Barriers & Risk Stratification   Activity Barriers None   Risk Stratification High      6 Minute Walk:     6 Minute Walk      01/07/15 1514       6 Minute Walk   Phase Initial  Distance 1010 feet     Walk Time 6 minutes     Resting HR 96 bpm     Resting BP 112/80 mmHg     Max Ex. HR 106 bpm     Max Ex. BP 118/60 mmHg     RPE 15     Symptoms Yes (comment)     Comments Shortness of breath        Initial Exercise Prescription:     Initial Exercise Prescription - 01/07/15 1500    Date of Initial Exercise Prescription   Date 01/07/15   Treadmill   MPH 1.5   Grade 0   Minutes 15   Bike   Level 0.8   Minutes 15   Recumbant Bike   Level 2   Watts 20   Minutes 15   NuStep   Level 3   Watts 30   Minutes 15   Arm Ergometer   Level 1   Watts 8   Minutes 15   Arm/Foot Ergometer   Level 1   Watts 10   Minutes 15   Cybex    Level 1   RPM 40   Minutes 15   Recumbant Elliptical   Level 1   Watts 20   Minutes 15   Elliptical   Level 1   Speed 2   Minutes 5   REL-XR   Level 2   Watts 20   Minutes 15   Prescription Details   Frequency (times per week) 3   Duration Progress to 30 minutes of continuous aerobic without signs/symptoms of physical distress   Intensity   THRR REST +  30   Ratings of Perceived Exertion 11-15   Progression Continue progressive overload as per policy without signs/symptoms or physical distress.   Resistance Training   Training Prescription Yes   Weight 2   Reps 10-12      Exercise Prescription Changes:     Exercise Prescription Changes      01/15/15 1500 02/11/15 1200 03/12/15 0800 04/09/15 0800     Exercise Review   Progression No  Has not started exercise yet, medical review 01/07/15 Yes No  Absent since last review; last visit 02/07/15 No  Absent since last review; last visit 02/07/15    Response to Exercise   Blood Pressure (Admit)  114/70 mmHg      Blood Pressure (Exercise)  126/64 mmHg      Blood Pressure (Exit)  122/70 mmHg      Heart Rate (Admit)  88 bpm      Heart Rate (Exercise)  105 bpm      Heart Rate (Exit)  99 bpm      Rating of Perceived Exertion (Exercise)  13      Symptoms  None      Comments  Reviewed individualized exercise prescription and made increases per departmental policy. Exercise increases were discussed with the patient and they were able to perform the new work loads without issue (no signs or symptoms).  No increases until returns and regularly attends exercise sessions. No increases until returns and regularly attends exercise sessions.    Duration  Progress to 30 minutes of continuous aerobic without signs/symptoms of physical distress Progress to 30 minutes of continuous aerobic without signs/symptoms of physical distress Progress to 30 minutes of continuous aerobic without signs/symptoms of physical distress    Intensity  Rest +  30 Rest + 30 Rest + 30    Progression  Continue progressive  overload as per policy without signs/symptoms or physical distress. Continue progressive overload as per policy without signs/symptoms or physical distress. Continue progressive overload as per policy without signs/symptoms or physical distress.    Resistance Training   Training Prescription  Yes Yes Yes    Weight  '2 2 2    ' Reps  10-15 10-15 10-15    Interval Training   Interval Training  No No No    NuStep   Level  '3 3 3    ' Watts  '25 25 25    ' Minutes  '25 25 25    ' REL-XR   Level  '4 4 4    ' Watts  50 50 50    Minutes  '15 15 15       ' Discharge Exercise Prescription (Final Exercise Prescription Changes):     Exercise Prescription Changes - 04/09/15 0800    Exercise Review   Progression No  Absent since last review; last visit 02/07/15   Response to Exercise   Comments No increases until returns and regularly attends exercise sessions.   Duration Progress to 30 minutes of continuous aerobic without signs/symptoms of physical distress   Intensity Rest + 30   Progression Continue progressive overload as per policy without signs/symptoms or physical distress.   Resistance Training   Training Prescription Yes   Weight 2   Reps 10-15   Interval Training   Interval Training No   NuStep   Level 3   Watts 25   Minutes 25   REL-XR   Level 4   Watts 50   Minutes 15      Nutrition:  Target Goals: Understanding of nutrition guidelines, daily intake of sodium <1529m, cholesterol <2063m calories 30% from fat and 7% or less from saturated fats, daily to have 5 or more servings of fruits and vegetables.  Biometrics:     Pre Biometrics - 01/07/15 1518    Pre Biometrics   Height '5\' 6"'  (1.676 m)   Weight 165 lb (74.844 kg)   Waist Circumference 38 inches   Hip Circumference 42 inches   Waist to Hip Ratio 0.9 %   BMI (Calculated) 26.7       Nutrition Therapy Plan and Nutrition Goals:     Nutrition Therapy &  Goals - 01/08/15 1512    Nutrition Therapy   Drug/Food Interactions Statins/Certain Fruits   Intervention Plan   Intervention Using nutrition plan and personal goals to gain a healthy nutrition lifestyle. Add exercise as prescribed.      Nutrition Discharge: Rate Your Plate Scores:   Nutrition Goals Re-Evaluation:     Nutrition Goals Re-Evaluation      02/07/15 1642           Personal Goal #1 Re-Evaluation   Personal Goal #1 ElBlessrefers not to meet individually with the dietician. She said her doctor told her she can have 200074mf sodium a day and a "can of soup" has 1500m48m      Goal Progress Seen Yes          Psychosocial: Target Goals: Acknowledge presence or absence of depression, maximize coping skills, provide positive support system. Participant is able to verbalize types and ability to use techniques and skills needed for reducing stress and depression.  Initial Review & Psychosocial Screening:     Initial Psych Review & Screening - 01/07/15 1537    Initial Review   Current issues with Current Depression;Current Stress Concerns   Family Dynamics  Good Support System? Yes   Screening Interventions   Interventions Encouraged to exercise;Program counselor consult;Other (comment)   Comments Varina Hulon stated she is depressed and has "tried medicine and counseling before and it didn't help. Anyone would be depressed if they had gone through what I have in the past 10 years. I took care of my husband for 5 years who was paralzyed on one side with a stroke. My daughter also had the same type of cancer 6 years ago and we moved up to [facility] to help take care of her 3 /4 children while she was receiving treatments. 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 I encouraged her to exercise and will consult our Cardiac REhab mental health counselor.       Quality of Life Scores:     Quality of Life - 01/07/15 1513    Quality of Life Scores   Health/Function  Pre 17.14 %   Socioeconomic Pre 19.83 %   Psych/Spiritual Pre 15.43 %   Family Pre 25.5 %   GLOBAL Pre 18.35 %      PHQ-9:     Recent Review Flowsheet Data    Depression screen [facility] 2/9 01/07/2015   Decreased Interest 3   Down, Depressed, Hopeless 2   PHQ - 2 Score 5   Altered sleeping 1   Tired, decreased energy 2   Change in appetite 3   Feeling bad or failure about yourself  1   Trouble concentrating 1   Moving slowly or fidgety/restless 1   Suicidal thoughts 1   PHQ-9 Score 15   Difficult doing work/chores Somewhat difficult      Psychosocial Evaluation and Intervention:     Psychosocial Evaluation - 02/07/15 1646    Psychosocial Evaluation & Interventions   Continued Psychosocial Services Needed Yes      Psychosocial Re-Evaluation:   Vocational Rehabilitation: Provide vocational rehab assistance to qualifying candidates.   Vocational Rehab Evaluation & Intervention:     Vocational Rehab - 01/07/15 1535    Initial Vocational Rehab Evaluation & Intervention   Assessment shows need for Vocational Rehabilitation No      Education: Education Goals: Education classes will be provided on a weekly basis, covering required topics. Participant will state understanding/return demonstration of topics presented.  Learning Barriers/Preferences:     Learning Barriers/Preferences - 01/08/15 1509    Learning Barriers/Preferences   Learning Barriers None   Learning Preferences None      Education Topics: General Nutrition Guidelines/Fats and Fiber: -Group instruction provided by verbal, written material, models and posters to present the general guidelines for heart healthy nutrition. Gives an explanation and review of dietary fats and fiber.   Controlling Sodium/Reading Food Labels: -Group verbal and written material supporting the discussion of sodium use in heart healthy nutrition. Review and explanation with models, verbal and written materials for  utilization of the food label.   Exercise Physiology & Risk Factors: - Group verbal and written instruction with models to review the exercise physiology of the cardiovascular system and associated critical values. Details cardiovascular disease risk factors and the goals associated with each risk factor.   Aerobic Exercise & Resistance Training: - Gives group verbal and written discussion on the health impact of inactivity. On the components of aerobic and resistive training programs and the benefits of this training and how to safely progress through these programs.   Flexibility, Balance, General Exercise Guidelines: - Provides group verbal and written instruction on the  benefits of flexibility and balance training programs. Provides general exercise guidelines with specific guidelines to those with heart or lung disease. Demonstration and skill practice provided.   Stress Management: - Provides group verbal and written instruction about the health risks of elevated stress, cause of high stress, and healthy ways to reduce stress.   Depression: - Provides group verbal and written instruction on the correlation between heart/lung disease and depressed mood, treatment options, and the stigmas associated with seeking treatment.          Cardiac Rehab from 02/06/2015 in [facility] Cardiac and Pulmonary Rehab   Date  02/06/15   Educator  [facility]   Instruction Review Code  2- meets goals/outcomes      Anatomy & Physiology of the Heart: - Group verbal and written instruction and models provide basic cardiac anatomy and physiology, with the coronary electrical and arterial systems. Review of: AMI, Angina, Valve disease, Heart Failure, Cardiac Arrhythmia, Pacemakers, and the ICD.   Cardiac Procedures: - Group verbal and written instruction and models to describe the testing methods done to diagnose heart disease. Reviews the outcomes of the test results. Describes the treatment choices: Medical  Management, Angioplasty, or Coronary Bypass Surgery.   Cardiac Medications: - Group verbal and written instruction to review commonly prescribed medications for heart disease. Reviews the medication, class of the drug, and side effects. Includes the steps to properly store meds and maintain the prescription regimen.   Go Sex-Intimacy & Heart Disease, Get SMART - Goal Setting: - Group verbal and written instruction through game format to discuss heart disease and the return to sexual intimacy. Provides group verbal and written material to discuss and apply goal setting through the application of the S.M.A.R.T. Method.   Other Matters of the Heart: - Provides group verbal, written materials and models to describe Heart Failure, Angina, Valve Disease, and Diabetes in the realm of heart disease. Includes description of the disease process and treatment options available to the cardiac patient.   Exercise & Equipment Safety: - Individual verbal instruction and demonstration of equipment use and safety with use of the equipment.      Cardiac Rehab from 02/06/2015 in [facility] Cardiac and Pulmonary Rehab   Date  01/07/15   Educator  C. Enterkin,RN   Instruction Review Code  1- partially meets, needs review/practice      Infection Prevention: - Provides verbal and written material to individual with discussion of infection control including proper hand washing and proper equipment cleaning during exercise session.      Cardiac Rehab from 02/06/2015 in [facility] Cardiac and Pulmonary Rehab   Date  01/07/15   Educator  kin,RN   Instruction Review Code  2- meets goals/outcomes      Falls Prevention: - Provides verbal and written material to individual with discussion of falls prevention and safety.      Cardiac Rehab from 02/06/2015 in [facility] Cardiac and Pulmonary Rehab   Date  01/07/15   Educator  C. Enterkin   Instruction Review Code  2- meets goals/outcomes      Diabetes: - Individual verbal  and written instruction to review signs/symptoms of diabetes, desired ranges of glucose level fasting, after meals and with exercise. Advice that pre and post exercise glucose checks will be done for 3 sessions at entry of program.    Knowledge Questionnaire Score:     Knowledge Questionnaire Score - 01/07/15 1510    Knowledge Questionnaire Score   Pre Score 21  Personal Goals and Risk Factors at Admission:     Personal Goals and Risk Factors at Admission - 01/07/15 1804    Personal Goals and Risk Factors on Admission   Increase Aerobic Exercise and Physical Activity Yes   Intervention While in program, learn and follow the exercise prescription taught. Start at a low level workload and increase workload after able to maintain previous level for 30 minutes. Increase time before increasing intensity.   Take Less Medication Yes   Intervention Learn your risk factors and begin the lifestyle modifications for risk factor control during your time in the program.   Diabetes No   Hypertension Yes   Goal Participant will see blood pressure controlled within the values of 140/25m/Hg or within value directed by their physician.   Intervention Provide nutrition & aerobic exercise along with prescribed medications to achieve BP 140/90 or less.   Stress Yes   Goal To meet with psychosocial counselor for stress and relaxation information and guidance. To state understanding of performing relaxation techniques and or identifying personal stressors.   Intervention Provide education on types of stress, identifiying stressors, and ways to cope with stress. Provide demonstration and active practice of relaxation techniques.      Personal Goals and Risk Factors Review:      Goals and Risk Factor Review      02/07/15 1644           Increase Aerobic Exercise and Physical Activity   Goals Progress/Improvement seen  Yes       Comments EKenorais able to do level 4 on recumbent ellipcitcl  (XR6000) with no problems. EDarriellesaid she wants to get her strength back so she can just do her housework even without getting tired.        Hypertension   Goal --  Stable blood pressure today.       Stress   Goal --  Has met with our CPenn WynneCounselor.          Personal Goals Discharge (Final Personal Goals and Risk Factors Review):      Goals and Risk Factor Review - 02/07/15 1644    Increase Aerobic Exercise and Physical Activity   Goals Progress/Improvement seen  Yes   Comments EKleeis able to do level 4 on recumbent ellipcitcl (XR6000) with no problems. EDeshasaid she wants to get her strength back so she can just do her housework even without getting tired.    Hypertension   Goal --  Stable blood pressure today.   Stress   Goal --  Has met with our CGreenwoodCounselor.      ITP Comments:     ITP Comments      03/19/15 1029           ITP Comments 30 day review  Continue with ITP  EQianahas been absent since 10/27          Comments: ERaahihas been absent since 02/07/2015. She has had a gout flare in her feet in Nov. Still dealing with this concern. Other health issues are being resolved at this time. She asked to be discharged form the program.

## 2015-04-12 NOTE — Telephone Encounter (Signed)
Called to check on status to return to program.   Last visit 02/07/15   Has had problem with gout in her feet, still trouble with the gout.  Waiting for new medicine to help prevent it.    May be needing ICD placement soon.  Has asked to be discharged.

## 2015-04-14 HISTORY — PX: ICD IMPLANT: EP1208

## 2016-04-23 ENCOUNTER — Other Ambulatory Visit: Payer: Self-pay | Admitting: Internal Medicine

## 2016-04-23 ENCOUNTER — Ambulatory Visit
Admission: RE | Admit: 2016-04-23 | Discharge: 2016-04-23 | Disposition: A | Discharge status: Home / Self Care | Payer: Medicare Other | Source: Ambulatory Visit | Attending: Internal Medicine | Admitting: Internal Medicine

## 2016-04-23 DIAGNOSIS — R197 Diarrhea, unspecified: Secondary | ICD-10-CM

## 2016-04-23 DIAGNOSIS — N951 Menopausal and female climacteric states: Secondary | ICD-10-CM

## 2016-04-23 DIAGNOSIS — Z1231 Encounter for screening mammogram for malignant neoplasm of breast: Secondary | ICD-10-CM

## 2016-06-03 ENCOUNTER — Ambulatory Visit: Payer: Medicare Other

## 2016-06-03 ENCOUNTER — Ambulatory Visit
Admission: RE | Admit: 2016-06-03 | Discharge: 2016-06-03 | Disposition: A | Discharge status: Home / Self Care | Payer: Medicare Other | Source: Ambulatory Visit | Attending: Internal Medicine | Admitting: Internal Medicine

## 2016-06-03 DIAGNOSIS — Z1231 Encounter for screening mammogram for malignant neoplasm of breast: Secondary | ICD-10-CM | POA: Insufficient documentation

## 2016-07-20 ENCOUNTER — Emergency Department: Payer: Medicare Other

## 2016-07-20 ENCOUNTER — Inpatient Hospital Stay
Admission: EM | Admit: 2016-07-20 | Discharge: 2016-07-22 | DRG: 194 | Disposition: A | Discharge status: Home / Self Care | Payer: Medicare Other | Attending: Internal Medicine | Admitting: Internal Medicine

## 2016-07-20 ENCOUNTER — Encounter: Payer: Self-pay | Admitting: Emergency Medicine

## 2016-07-20 DIAGNOSIS — I959 Hypotension, unspecified: Secondary | ICD-10-CM | POA: Diagnosis present

## 2016-07-20 DIAGNOSIS — E039 Hypothyroidism, unspecified: Secondary | ICD-10-CM | POA: Diagnosis present

## 2016-07-20 DIAGNOSIS — J189 Pneumonia, unspecified organism: Secondary | ICD-10-CM | POA: Diagnosis not present

## 2016-07-20 DIAGNOSIS — I2510 Atherosclerotic heart disease of native coronary artery without angina pectoris: Secondary | ICD-10-CM | POA: Diagnosis present

## 2016-07-20 DIAGNOSIS — R000 Tachycardia, unspecified: Secondary | ICD-10-CM | POA: Diagnosis present

## 2016-07-20 DIAGNOSIS — E119 Type 2 diabetes mellitus without complications: Secondary | ICD-10-CM | POA: Diagnosis present

## 2016-07-20 DIAGNOSIS — R350 Frequency of micturition: Secondary | ICD-10-CM | POA: Diagnosis present

## 2016-07-20 DIAGNOSIS — E871 Hypo-osmolality and hyponatremia: Secondary | ICD-10-CM | POA: Diagnosis present

## 2016-07-20 DIAGNOSIS — Z950 Presence of cardiac pacemaker: Secondary | ICD-10-CM | POA: Diagnosis not present

## 2016-07-20 DIAGNOSIS — I5022 Chronic systolic (congestive) heart failure: Secondary | ICD-10-CM | POA: Diagnosis present

## 2016-07-20 DIAGNOSIS — Z87891 Personal history of nicotine dependence: Secondary | ICD-10-CM

## 2016-07-20 DIAGNOSIS — Z952 Presence of prosthetic heart valve: Secondary | ICD-10-CM | POA: Diagnosis not present

## 2016-07-20 DIAGNOSIS — R509 Fever, unspecified: Secondary | ICD-10-CM

## 2016-07-20 DIAGNOSIS — A419 Sepsis, unspecified organism: Secondary | ICD-10-CM | POA: Diagnosis present

## 2016-07-20 DIAGNOSIS — Z7901 Long term (current) use of anticoagulants: Secondary | ICD-10-CM

## 2016-07-20 DIAGNOSIS — Z79899 Other long term (current) drug therapy: Secondary | ICD-10-CM | POA: Diagnosis not present

## 2016-07-20 DIAGNOSIS — K0889 Other specified disorders of teeth and supporting structures: Secondary | ICD-10-CM | POA: Diagnosis present

## 2016-07-20 DIAGNOSIS — I110 Hypertensive heart disease with heart failure: Secondary | ICD-10-CM | POA: Diagnosis present

## 2016-07-20 DIAGNOSIS — R319 Hematuria, unspecified: Secondary | ICD-10-CM | POA: Diagnosis present

## 2016-07-20 DIAGNOSIS — Z8572 Personal history of non-Hodgkin lymphomas: Secondary | ICD-10-CM

## 2016-07-20 DIAGNOSIS — R0602 Shortness of breath: Secondary | ICD-10-CM

## 2016-07-20 LAB — URINALYSIS, COMPLETE (UACMP) WITH MICROSCOPIC
Bilirubin Urine: NEGATIVE
Glucose, UA: NEGATIVE mg/dL
KETONES UR: 5 mg/dL — AB
Leukocytes, UA: NEGATIVE
Nitrite: NEGATIVE
PROTEIN: 30 mg/dL — AB
Specific Gravity, Urine: 1.024 (ref 1.005–1.030)
pH: 7 (ref 5.0–8.0)

## 2016-07-20 LAB — CBC WITH DIFFERENTIAL/PLATELET
Basophils Absolute: 0 10*3/uL (ref 0–0.1)
Basophils Relative: 1 %
EOS ABS: 0.1 10*3/uL (ref 0–0.7)
EOS PCT: 1 %
HCT: 35.6 % (ref 35.0–47.0)
Hemoglobin: 12.1 g/dL (ref 12.0–16.0)
LYMPHS ABS: 0.7 10*3/uL — AB (ref 1.0–3.6)
LYMPHS PCT: 11 %
MCH: 33.1 pg (ref 26.0–34.0)
MCHC: 33.9 g/dL (ref 32.0–36.0)
MCV: 97.5 fL (ref 80.0–100.0)
MONOS PCT: 6 %
Monocytes Absolute: 0.4 10*3/uL (ref 0.2–0.9)
Neutro Abs: 5.5 10*3/uL (ref 1.4–6.5)
Neutrophils Relative %: 81 %
PLATELETS: 137 10*3/uL — AB (ref 150–440)
RBC: 3.65 MIL/uL — AB (ref 3.80–5.20)
RDW: 13.4 % (ref 11.5–14.5)
WBC: 6.7 10*3/uL (ref 3.6–11.0)

## 2016-07-20 LAB — COMPREHENSIVE METABOLIC PANEL
ALBUMIN: 3.5 g/dL (ref 3.5–5.0)
ALT: 16 U/L (ref 14–54)
AST: 29 U/L (ref 15–41)
Alkaline Phosphatase: 67 U/L (ref 38–126)
Anion gap: 8 (ref 5–15)
BUN: 14 mg/dL (ref 6–20)
CHLORIDE: 101 mmol/L (ref 101–111)
CO2: 24 mmol/L (ref 22–32)
CREATININE: 0.82 mg/dL (ref 0.44–1.00)
Calcium: 9.2 mg/dL (ref 8.9–10.3)
GFR calc Af Amer: >60 mL/min (ref >60)
Glucose, Bld: 119 mg/dL — ABNORMAL HIGH (ref 65–99)
POTASSIUM: 3.8 mmol/L (ref 3.5–5.1)
SODIUM: 133 mmol/L — AB (ref 135–145)
Total Bilirubin: 1.8 mg/dL — ABNORMAL HIGH (ref 0.3–1.2)
Total Protein: 7.3 g/dL (ref 6.5–8.1)

## 2016-07-20 LAB — LACTIC ACID, PLASMA: Lactic Acid, Venous: 1.7 mmol/L (ref 0.5–1.9)

## 2016-07-20 LAB — INFLUENZA PANEL BY PCR (TYPE A & B)
INFLAPCR: NEGATIVE
INFLBPCR: NEGATIVE

## 2016-07-20 MED ORDER — METRONIDAZOLE IN NACL 5-0.79 MG/ML-% IV SOLN
500.0000 mg | Freq: Three times a day (TID) | INTRAVENOUS | Status: DC
  Administered 2016-07-20 – 2016-07-22 (×5): 500 mg via INTRAVENOUS
  Filled 2016-07-20 (×7): qty 100

## 2016-07-20 MED ORDER — GABAPENTIN 300 MG PO CAPS
300.0000 mg | ORAL_CAPSULE | Freq: Every day | ORAL | Status: DC
  Filled 2016-07-20: qty 1

## 2016-07-20 MED ORDER — LEVOTHYROXINE SODIUM 100 MCG PO TABS
100.0000 ug | ORAL_TABLET | Freq: Every day | ORAL | Status: DC
  Administered 2016-07-21 – 2016-07-22 (×2): 100 ug via ORAL
  Filled 2016-07-20 (×2): qty 1

## 2016-07-20 MED ORDER — SODIUM CHLORIDE 0.9 % IV BOLUS (SEPSIS)
1000.0000 mL | Freq: Once | INTRAVENOUS | Status: AC
  Administered 2016-07-20: 1000 mL via INTRAVENOUS

## 2016-07-20 MED ORDER — SODIUM CHLORIDE 0.9 % IV SOLN
INTRAVENOUS | Status: DC
  Administered 2016-07-20: 22:00:00 via INTRAVENOUS

## 2016-07-20 MED ORDER — ACETAMINOPHEN 650 MG RE SUPP: 650.0000 mg | Freq: Four times a day (QID) | RECTAL | Status: DC | PRN

## 2016-07-20 MED ORDER — ONDANSETRON HCL 4 MG/2ML IJ SOLN: 4.0000 mg | Freq: Four times a day (QID) | INTRAMUSCULAR | Status: DC | PRN

## 2016-07-20 MED ORDER — ATORVASTATIN CALCIUM 20 MG PO TABS
80.0000 mg | ORAL_TABLET | Freq: Every day | ORAL | Status: DC
  Administered 2016-07-21 – 2016-07-22 (×2): 80 mg via ORAL
  Filled 2016-07-20 (×2): qty 4

## 2016-07-20 MED ORDER — ACETAMINOPHEN 325 MG PO TABS
650.0000 mg | ORAL_TABLET | Freq: Four times a day (QID) | ORAL | Status: DC
  Administered 2016-07-20 – 2016-07-21 (×3): 650 mg via ORAL
  Filled 2016-07-20 (×6): qty 2

## 2016-07-20 MED ORDER — CEFTRIAXONE SODIUM-DEXTROSE 1-3.74 GM-% IV SOLR
INTRAVENOUS | Status: AC
  Filled 2016-07-20: qty 50

## 2016-07-20 MED ORDER — ACETAMINOPHEN 325 MG PO TABS
650.0000 mg | ORAL_TABLET | Freq: Four times a day (QID) | ORAL | Status: DC | PRN
  Administered 2016-07-21 (×2): 650 mg via ORAL
  Filled 2016-07-20: qty 2

## 2016-07-20 MED ORDER — CEFTRIAXONE SODIUM 1 G IJ SOLR
1.0000 g | Freq: Once | INTRAMUSCULAR | Status: AC
  Administered 2016-07-20: 1 g via INTRAVENOUS
  Filled 2016-07-20: qty 10

## 2016-07-20 MED ORDER — ONDANSETRON HCL 4 MG PO TABS: 4.0000 mg | ORAL_TABLET | Freq: Four times a day (QID) | ORAL | Status: DC | PRN

## 2016-07-20 MED ORDER — DEXTROSE 5 % IV SOLN
1.0000 g | INTRAVENOUS | Status: DC
  Administered 2016-07-21: 1 g via INTRAVENOUS
  Filled 2016-07-20 (×3): qty 10

## 2016-07-20 MED ORDER — DOCUSATE SODIUM 100 MG PO CAPS
100.0000 mg | ORAL_CAPSULE | Freq: Two times a day (BID) | ORAL | Status: DC
  Administered 2016-07-21 – 2016-07-22 (×2): 100 mg via ORAL
  Filled 2016-07-20 (×3): qty 1

## 2016-07-20 MED ORDER — DEXTROSE 5 % IV SOLN: 1.0000 g | INTRAVENOUS | Status: DC

## 2016-07-20 MED ORDER — APIXABAN 5 MG PO TABS
5.0000 mg | ORAL_TABLET | Freq: Two times a day (BID) | ORAL | Status: DC
  Administered 2016-07-20 – 2016-07-22 (×4): 5 mg via ORAL
  Filled 2016-07-20 (×4): qty 1

## 2016-07-20 MED ORDER — FERROUS SULFATE 325 (65 FE) MG PO TABS
325.0000 mg | ORAL_TABLET | Freq: Every day | ORAL | Status: DC
  Administered 2016-07-21 – 2016-07-22 (×2): 325 mg via ORAL
  Filled 2016-07-20 (×3): qty 1

## 2016-07-20 MED ORDER — HYDROCODONE-ACETAMINOPHEN 5-325 MG PO TABS: 1.0000 | ORAL_TABLET | ORAL | Status: DC | PRN

## 2016-07-20 MED ORDER — SODIUM CHLORIDE 0.9 % IV BOLUS (SEPSIS): 500.0000 mL | Freq: Once | INTRAVENOUS | Status: DC

## 2016-07-20 MED ORDER — TRAZODONE HCL 50 MG PO TABS: 25.0000 mg | ORAL_TABLET | Freq: Every evening | ORAL | Status: DC | PRN

## 2016-07-20 MED ORDER — DEXTROSE 5 % IV SOLN: 1.0000 g | Freq: Once | INTRAVENOUS | Status: DC

## 2016-07-20 MED ORDER — BISACODYL 5 MG PO TBEC: 5.0000 mg | DELAYED_RELEASE_TABLET | Freq: Every day | ORAL | Status: DC | PRN

## 2016-07-20 NOTE — H&P (Signed)
[facility] NAME: Christie Carpenter    MR#:  [id number]  DATE OF BIRTH:  03/16/1946  DATE OF ADMISSION:  07/20/2016  PRIMARY CARE PHYSICIAN: Harriett Neita Carp, MD   REQUESTING/REFERRING PHYSICIAN:   [place]   Chief Complaint  Patient presents with  . Fever  . Dental Problem    HISTORY OF PRESENT ILLNESS:  Christie Carpenter  is a 71 y.o. female with a known history of htn, Chronic systolic heart failure, mitral regurgitation came in because of fever, chills since Friday.pt has bad infection of left upper premolar. Gen. hypotensive in the emergency room blood pressure 83/66, temperature 100.80F. Received 2 litres fluid  In ER.blood pressure better now.pt c/o right cva pain ,mild nausea. admittinge patient for sepsis due to teeth infection and  UTI. PAST MEDICAL HISTORY:   Past Medical History:  Diagnosis Date  . 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 CHF (congestive heart failure) ([place])   . Coronary artery disease   . Depression   . Diabetes mellitus without complication ([place])   . Heart murmur   . Hypothyroidism   . Mitral valve problem   . Presence of permanent cardiac pacemaker   . Pulmonary hypertension   . Shortness of breath dyspnea     PAST SURGICAL HISTOIRY:   Past Surgical History:  Procedure Laterality Date  . PACEMAKER INSERTION      SOCIAL HISTORY:   Social History  Substance Use Topics  . Smoking status: Former Research scientist (life sciences)  . Smokeless tobacco: Never Used  . Alcohol use No    FAMILY HISTORY:   Family History  Problem Relation Age of Onset  . Leukemia Other     DRUG ALLERGIES:   Allergies  Allergen Reactions  . Oxycodone Shortness Of Breath    REVIEW OF SYSTEMS:  CONSTITUTIONAL:fever/fatigue EYES: No blurred or double vision.  EARS, NOSE, AND THROAT: No tinnitus or ear pain.  RESPIRATORY: No cough, shortness of breath, wheezing or hemoptysis.  CARDIOVASCULAR: No chest pain,  orthopnea, edema.  GASTROINTESTINAL: No nausea, vomiting, diarrhea or abdominal pain.  GENITOURINARY: dysura long time back.  ENDOCRINE: No polyuria, nocturia,  HEMATOLOGY: No anemia, easy bruising or bleeding SKIN: No rash or lesion. MUSCULOSKELETAL: No joint pain or arthritis.   NEUROLOGIC: No tingling, numbness, weakness.  PSYCHIATRY: No anxiety or depression.   MEDICATIONS AT HOME:   Prior to Admission medications   Medication Sig Start Date End Date Taking? Authorizing Provider  allopurinol (ZYLOPRIM) 300 MG tablet Take 100 mg by mouth daily.   Yes Historical Provider, MD  atorvastatin (LIPITOR) 80 MG tablet Take 1 tablet by mouth daily. 11/26/14 07/20/17 Yes Historical Provider, MD  levothyroxine (SYNTHROID, LEVOTHROID) 100 MCG tablet Take 1 tablet by mouth every morning. 12/01/14 07/20/17 Yes Historical Provider, MD  lisinopril (PRINIVIL,ZESTRIL) 2.5 MG tablet Take 1 tablet by mouth daily. 12/01/14 07/20/17 Yes Historical Provider, MD  metoprolol succinate (TOPROL-XL) 25 MG 24 hr tablet Take 1 tablet by mouth daily. 12/25/14 07/20/17 Yes Historical Provider, MD  acetaminophen (TYLENOL) 325 MG tablet Take 2 tablets by mouth every 6 (six) hours. 11/26/14   Historical Provider, MD  apixaban (ELIQUIS) 5 MG TABS tablet Take 5 mg by mouth 2 (two) times daily.    Historical Provider, MD  ferrous sulfate 325 (65 FE) MG tablet Take 1 tablet by mouth daily. 12/14/14 12/14/15  Historical Provider, MD  furosemide (LASIX) 40 MG tablet Take 40 mg by mouth  daily.    Historical Provider, MD  gabapentin (NEURONTIN) 300 MG capsule Take 1 capsule by mouth at bedtime. 12/25/14   Historical Provider, MD  levETIRAcetam (KEPPRA) 500 MG tablet Take 1 tablet by mouth 2 (two) times daily. 12/24/14 12/24/15  Historical Provider, MD  spironolactone (ALDACTONE) 25 MG tablet Take 0.5 tablets by mouth daily. 12/01/14 12/01/15  Historical Provider, MD      VITAL SIGNS:  Blood pressure (!) 151/65, pulse 82, temperature 98.1 F (36.7  C), temperature source Oral, resp. rate 16, height 5\' 5"  (1.651 m), weight 75.8 kg (167 lb), SpO2 100 %.  PHYSICAL EXAMINATION:  GENERAL:  71 y.o.-year-old patient lying in the bed with no acute distress.  EYES: Pupils equal, round, reactive to light No scleral icterus. Extraocular muscles intact.  HEENT: Head atraumatic, normocephalic. Oropharynx and nasopharynx clear.teeth infected with pockets of pus.no swelling above teeth, NECK:  Supple, no jugular venous distention. No thyroid enlargement, no tenderness.  LUNGS: Normal breath sounds bilaterally, no wheezing, rales,rhonchi or crepitation. No use of accessory muscles of respiration.  CARDIOVASCULAR: S1, S2 normal. No murmurs, rubs, or gallops.  ABDOMEN: Soft, nontender, nondistended. Bowel sounds present. No organomegaly or mass.  EXTREMITIES: No pedal edema, cyanosis, or clubbing.  NEUROLOGIC: Cranial nerves II through XII are intact. Muscle strength 5/5 in all extremities. Sensation intact. Gait not checked.  PSYCHIATRIC: The patient is alert and oriented x 3.  SKIN: No obvious rash, lesion, or ulcer.   LABORATORY PANEL:   CBC  Recent Labs Lab 07/20/16 1218  WBC 6.7  HGB 12.1  HCT 35.6  PLT 137*   ------------------------------------------------------------------------------------------------------------------  Chemistries   Recent Labs Lab 07/20/16 1218  NA 133*  K 3.8  CL 101  CO2 24  GLUCOSE 119*  BUN 14  CREATININE 0.82  CALCIUM 9.2  AST 29  ALT 16  ALKPHOS 67  BILITOT 1.8*   ------------------------------------------------------------------------------------------------------------------  Cardiac Enzymes No results for input(s): TROPONINI in the last 168 hours. ------------------------------------------------------------------------------------------------------------------  RADIOLOGY:  Ct Renal Stone Study  Result Date: 07/20/2016 CLINICAL DATA:  Right-sided flank pain and hematuria EXAM: CT ABDOMEN  AND PELVIS WITHOUT CONTRAST TECHNIQUE: Multidetector CT imaging of the abdomen and pelvis was performed following the standard protocol without IV contrast. COMPARISON:  05/04/2011 FINDINGS: Lower chest: No acute abnormality. Hepatobiliary: Liver is within normal limits. The gallbladder is well distended with multiple gallstones within similar to that seen on prior exam. No biliary dilatation is noted. Pancreas: Unremarkable. No pancreatic ductal dilatation or surrounding inflammatory changes. Spleen: Normal in size without focal abnormality. Adrenals/Urinary Tract: The adrenal glands are within normal limits. No calculi or obstructive changes are noted. The bladder is partially distended. A right renal cyst is seen measuring 2.7 cm in greatest dimension. Stomach/Bowel: Stomach is within normal limits. Appendix appears normal. No evidence of bowel wall thickening, distention, or inflammatory changes. Significant redundancy of the sigmoid colon is noted. Vascular/Lymphatic: Aortic atherosclerosis. No enlarged abdominal or pelvic lymph nodes. Reproductive: Uterus and bilateral adnexa are unremarkable. Other: No abdominal wall hernia or abnormality. No abdominopelvic ascites. Musculoskeletal: No acute or significant osseous findings. IMPRESSION: No renal calculi or obstructive changes are noted. A right renal cyst is seen. Cholelithiasis without complicating factors. No other significant abnormality is noted. Electronically Signed   By: Inez Catalina M.D.   On: 07/20/2016 17:29    EKG:   Orders placed or performed during the hospital encounter of 07/20/16  . ED EKG 12-Lead  . ED EKG 12-Lead  IMPRESSION AND PLAN:   1.fever/chills,likley SIRS:with fever , hypotension on arrival. Initially had right CVA tenderness but on my exam she didn't have any right CVA tenderness. Source likely tooth and also mild UTI. Continue IV hydration, hold BP medicines, add Flagyl also in addition to Rocephin to cover for  anaerobic infections. #2 chronic systolic heart failure with EF 30%, history of mitral valve replacement, patient had a history of heart block, history of pacemaker, defibrillator placement, continue blood thinners but hold his Toprol, Lasix, metoprolol because of hypotension.  #3 follow blood cultures, if they are negative for infection discharge her home tomorrow with oral antibiotics and patient needs to see her dentist as soon as possible. D/w t patient's family extensively.  All the records are reviewed and case discussed with ED provider. Management plans discussed with the patient, family and they are in agreement.  CODE STATUS: full  TOTAL TIME TAKING CARE OF THIS PATIENT: 55 minutes.    Epifanio Lesches M.D on 07/20/2016 at 6:39 PM  Between 7am to 6pm - Pager - [phone]  After 6pm go to [email or web address] - password [email or web address]  Office  [phone]  CC: Primary care physician; Ellamae Sia, MD  Note: This dictation was prepared with Dragon dictation along with smaller phrase technology. Any transcriptional errors that result from this process are unintentional.

## 2016-07-20 NOTE — ED Notes (Signed)
Dr brown notified that only 1 set of blood cultures sent prior to antibiotics.

## 2016-07-20 NOTE — ED Provider Notes (Signed)
[facility] Emergency Department Provider Note   First MD Initiated Contact with Patient 07/20/16 1526     (approximate)  I have reviewed the triage vital signs and the nursing notes.   HISTORY  Chief Complaint Fever and Dental Problem   HPI Christie Carpenter is a 71 y.o. female with below of chronic medical conditions presents to the emergency department with fever chills and left premolar. Patient denies any chest or abdominal pain at this time. Patient denies any cough. Patient denies any dysuria however does admit to urinary frequency with dark colored urine. Patient noted to be febrile on presentation tachycardic and hypotensive and a such concern for possible sepsis   Past Medical History:  Diagnosis Date  . 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 CHF (congestive heart failure) ([place])   . Coronary artery disease   . Depression   . Diabetes mellitus without complication ([place])   . Heart murmur   . Hypothyroidism   . Mitral valve problem   . Presence of permanent cardiac pacemaker   . Pulmonary hypertension   . Shortness of breath dyspnea     Patient Active Problem List   Diagnosis Date Noted  . Cellulitis 01/17/2015  . Acute on chronic diastolic CHF (congestive heart failure), NYHA class 3 ([place]) 09/19/2014  . CHF (congestive heart failure) ([place]) 09/17/2014  . Severe mitral insufficiency 09/17/2014  . Hypothyroidism 09/17/2014  . Pulmonary hypertension 09/17/2014  . CAD (coronary artery disease) 09/17/2014  . Pacemaker 09/17/2014    Past Surgical History:  Procedure Laterality Date  . PACEMAKER INSERTION      Prior to Admission medications   Medication Sig Start Date End Date Taking? Authorizing Provider  acetaminophen (TYLENOL) 325 MG tablet Take 2 tablets by mouth every 6 (six) hours. 11/26/14   Historical Provider, MD  apixaban (ELIQUIS) 5 MG TABS tablet Take 5 mg by mouth 2 (two) times daily.    Historical Provider, MD  atorvastatin  (LIPITOR) 80 MG tablet Take 1 tablet by mouth daily. 11/26/14 11/26/15  Historical Provider, MD  ferrous sulfate 325 (65 FE) MG tablet Take 1 tablet by mouth daily. 12/14/14 12/14/15  Historical Provider, MD  furosemide (LASIX) 40 MG tablet Take 40 mg by mouth daily.    Historical Provider, MD  gabapentin (NEURONTIN) 300 MG capsule Take 1 capsule by mouth at bedtime. 12/25/14   Historical Provider, MD  levETIRAcetam (KEPPRA) 500 MG tablet Take 1 tablet by mouth 2 (two) times daily. 12/24/14 12/24/15  Historical Provider, MD  levothyroxine (SYNTHROID, LEVOTHROID) 100 MCG tablet Take 1 tablet by mouth every morning. 12/01/14 12/01/15  Historical Provider, MD  lisinopril (PRINIVIL,ZESTRIL) 2.5 MG tablet Take 1 tablet by mouth daily. 12/01/14 12/01/15  Historical Provider, MD  metoprolol succinate (TOPROL-XL) 25 MG 24 hr tablet Take 1 tablet by mouth daily. 12/25/14 12/25/15  Historical Provider, MD  spironolactone (ALDACTONE) 25 MG tablet Take 0.5 tablets by mouth daily. 12/01/14 12/01/15  Historical Provider, MD    Allergies Oxycodone  Family History  Problem Relation Age of Onset  . Leukemia Other     Social History Social History  Substance Use Topics  . Smoking status: Former Research scientist (life sciences)  . Smokeless tobacco: Never Used  . Alcohol use No    Review of Systems Constitutional: No fever/chills Eyes: No visual changes. ENT: No sore throat. Cardiovascular: Denies chest pain. Respiratory: Denies shortness of breath. Gastrointestinal: No abdominal pain.  No nausea, no vomiting.  No diarrhea.  No  constipation. Genitourinary: Negative for dysuria. Musculoskeletal: Negative for back pain. Skin: Negative for rash. Neurological: Negative for headaches, focal weakness or numbness.  10-point ROS otherwise negative.  ____________________________________________   PHYSICAL EXAM:  VITAL SIGNS: ED Triage Vitals  Enc Vitals Group     BP 07/20/16 1211 (!) 83/66     Pulse Rate 07/20/16 1211 (!) 114     Resp  07/20/16 1211 20     Temp 07/20/16 1211 100.2 F (37.9 C)     Temp Source 07/20/16 1211 Oral     SpO2 07/20/16 1211 96 %     Weight 07/20/16 1211 167 lb (75.8 kg)     Height 07/20/16 1211 5\' 5"  (1.651 m)     Head Circumference --      Peak Flow --      Pain Score 07/20/16 1227 0     Pain Loc --      Pain Edu? --      Excl. in [place]? --     Constitutional: Alert and oriented. Well appearing and in no acute distress. Eyes: Conjunctivae are normal. PERRL. EOMI. Head: Atraumatic. Mouth/Throat: Mucous membranes are moist.  Oropharynx non-erythematous. Left maxillary premolar cavity with erythema at the gum line Neck: No stridor.  No meningeal signs.  No cervical spine tenderness to palpation. Cardiovascular: Tachycardia, regular rhythm. Good peripheral circulation. Grossly normal heart sounds. Respiratory: Normal respiratory effort.  No retractions. Lungs CTAB. Gastrointestinal: Soft and nontender. No distention. Less CVA tenderness Musculoskeletal: No lower extremity tenderness nor edema. No gross deformities of extremities. Neurologic:  Normal speech and language. No gross focal neurologic deficits are appreciated.  Skin:  Skin is warm, dry and intact. No rash noted. Psychiatric: Mood and affect are normal. Speech and behavior are normal.  ____________________________________________   LABS (all labs ordered are listed, but only abnormal results are displayed)  Labs Reviewed  COMPREHENSIVE METABOLIC PANEL - Abnormal; Notable for the following:       Result Value   Sodium 133 (*)    Glucose, Bld 119 (*)    Total Bilirubin 1.8 (*)    All other components within normal limits  CBC WITH DIFFERENTIAL/PLATELET - Abnormal; Notable for the following:    RBC 3.65 (*)    Platelets 137 (*)    Lymphs Abs 0.7 (*)    All other components within normal limits  URINALYSIS, COMPLETE (UACMP) WITH MICROSCOPIC - Abnormal; Notable for the following:    Color, Urine AMBER (*)    APPearance HAZY  (*)    Hgb urine dipstick MODERATE (*)    Ketones, ur 5 (*)    Protein, ur 30 (*)    Bacteria, UA RARE (*)    Squamous Epithelial / LPF 0-5 (*)    All other components within normal limits  CULTURE, BLOOD (ROUTINE X 2)  CULTURE, BLOOD (ROUTINE X 2)  LACTIC ACID, PLASMA  LACTIC ACID, PLASMA  INFLUENZA PANEL BY PCR (TYPE A & B)   ____________________________________________  EKG  ED ECG REPORT I, [place] N BROWN, the attending physician, personally viewed and interpreted this ECG.   Date: 07/20/2016  EKG Time: 4:10 PM  Rate: 89  Rhythm: Atrial sensed ventricular paced rhythm  Axis: Normal  Intervals: Normal  ST&T Change: None  ____________________________________________  RADIOLOGY I, [place] N BROWN, personally viewed and evaluated these images (plain radiographs) as part of my medical decision making, as well as reviewing the written report by the radiologist.  Ct Renal Stone Study  Result Date:  07/20/2016 CLINICAL DATA:  Right-sided flank pain and hematuria EXAM: CT ABDOMEN AND PELVIS WITHOUT CONTRAST TECHNIQUE: Multidetector CT imaging of the abdomen and pelvis was performed following the standard protocol without IV contrast. COMPARISON:  05/04/2011 FINDINGS: Lower chest: No acute abnormality. Hepatobiliary: Liver is within normal limits. The gallbladder is well distended with multiple gallstones within similar to that seen on prior exam. No biliary dilatation is noted. Pancreas: Unremarkable. No pancreatic ductal dilatation or surrounding inflammatory changes. Spleen: Normal in size without focal abnormality. Adrenals/Urinary Tract: The adrenal glands are within normal limits. No calculi or obstructive changes are noted. The bladder is partially distended. A right renal cyst is seen measuring 2.7 cm in greatest dimension. Stomach/Bowel: Stomach is within normal limits. Appendix appears normal. No evidence of bowel wall thickening, distention, or inflammatory changes.  Significant redundancy of the sigmoid colon is noted. Vascular/Lymphatic: Aortic atherosclerosis. No enlarged abdominal or pelvic lymph nodes. Reproductive: Uterus and bilateral adnexa are unremarkable. Other: No abdominal wall hernia or abnormality. No abdominopelvic ascites. Musculoskeletal: No acute or significant osseous findings. IMPRESSION: No renal calculi or obstructive changes are noted. A right renal cyst is seen. Cholelithiasis without complicating factors. No other significant abnormality is noted. Electronically Signed   By: Inez Catalina M.D.   On: 07/20/2016 17:29    ____________________________________________   PROCEDURES  Critical Care performed: CRITICAL CARE Performed by: Gregor Hams   Total critical care time: 45 minutes  Critical care time was exclusive of separately billable procedures and treating other patients.  Critical care was necessary to treat or prevent imminent or life-threatening deterioration.  Critical care was time spent personally by me on the following activities: development of treatment plan with patient and/or surrogate as well as nursing, discussions with consultants, evaluation of patient's response to treatment, examination of patient, obtaining history from patient or surrogate, ordering and performing treatments and interventions, ordering and review of laboratory studies, ordering and review of radiographic studies, pulse oximetry and re-evaluation of patient's condition.  Procedure(s) performed:   Procedures   ____________________________________________   INITIAL IMPRESSION / ASSESSMENT AND PLAN / ED COURSE  Pertinent labs & imaging results that were available during my care of the patient were reviewed by me and considered in my medical decision making (see chart for details).  Given fever , tachycardia hypotension concerning for possible sepsis. Given CVA tenderness concern for possible pyelonephritis. Urinalysis revealed  hematuria however no other signs of infection other than rare bacteria and a such urine culture was obtained blood cultures pending. Patient received ceftriaxone in the emergency department given possibility of pyelonephritis patient discussed with hospitalist for hospital admission for further evaluation and management      ____________________________________________  FINAL CLINICAL IMPRESSION(S) / ED DIAGNOSES  Final diagnoses:  SOB (shortness of breath)  Sepsis Hematuria Possible pyelonephritis   MEDICATIONS GIVEN DURING THIS VISIT:  Medications  sodium chloride 0.9 % bolus 1,000 mL (1,000 mLs Intravenous New Bag/Given 07/20/16 1620)    And  sodium chloride 0.9 % bolus 1,000 mL (1,000 mLs Intravenous New Bag/Given 07/20/16 1702)    And  sodium chloride 0.9 % bolus 500 mL (not administered)  cefTRIAXone (ROCEPHIN) 1-3.74 GM-% IVPB (  Not Given 07/20/16 1708)  sodium chloride 0.9 % bolus 1,000 mL (1,000 mLs Intravenous New Bag/Given 07/20/16 1557)  cefTRIAXone (ROCEPHIN) 1 g in dextrose 5 % 50 mL IVPB (1 g Intravenous New Bag/Given 07/20/16 1657)     NEW OUTPATIENT MEDICATIONS STARTED DURING THIS VISIT:  New Prescriptions  No medications on file    Modified Medications   No medications on file    Discontinued Medications   No medications on file     Note:  This document was prepared using Dragon voice recognition software and may include unintentional dictation errors.    Gregor Hams, MD 07/21/16 [phone]

## 2016-07-20 NOTE — ED Notes (Signed)
Gave pt a gingerale  

## 2016-07-20 NOTE — ED Notes (Signed)
CODE  SEPSIS  CALLED  TO  [facility] 

## 2016-07-20 NOTE — ED Triage Notes (Signed)
Recently has bad tooth.  Has been having chills and fever.  Took ibuprofen at about 11a.

## 2016-07-20 NOTE — ED Notes (Signed)
See triage note   States she has a bad tooth  Developed fever on Friday with chills   States unable to reach her dentist today  Low grade fever on arrival

## 2016-07-21 ENCOUNTER — Inpatient Hospital Stay: Payer: Medicare Other

## 2016-07-21 LAB — CBC
HEMATOCRIT: 34.2 % — AB (ref 35.0–47.0)
HEMOGLOBIN: 11.2 g/dL — AB (ref 12.0–16.0)
MCH: 32.6 pg (ref 26.0–34.0)
MCHC: 32.7 g/dL (ref 32.0–36.0)
MCV: 99.7 fL (ref 80.0–100.0)
Platelets: 126 10*3/uL — ABNORMAL LOW (ref 150–440)
RBC: 3.43 MIL/uL — ABNORMAL LOW (ref 3.80–5.20)
RDW: 13.4 % (ref 11.5–14.5)
WBC: 8.2 10*3/uL (ref 3.6–11.0)

## 2016-07-21 LAB — BASIC METABOLIC PANEL
ANION GAP: 7 (ref 5–15)
BUN: 12 mg/dL (ref 6–20)
CO2: 23 mmol/L (ref 22–32)
Calcium: 8.6 mg/dL — ABNORMAL LOW (ref 8.9–10.3)
Chloride: 107 mmol/L (ref 101–111)
Creatinine, Ser: 0.64 mg/dL (ref 0.44–1.00)
GFR calc Af Amer: >60 mL/min (ref >60)
Glucose, Bld: 135 mg/dL — ABNORMAL HIGH (ref 65–99)
POTASSIUM: 3.5 mmol/L (ref 3.5–5.1)
SODIUM: 137 mmol/L (ref 135–145)

## 2016-07-21 LAB — GLUCOSE, CAPILLARY: GLUCOSE-CAPILLARY: 109 mg/dL — AB (ref 65–99)

## 2016-07-21 MED ORDER — FUROSEMIDE 20 MG PO TABS
20.0000 mg | ORAL_TABLET | Freq: Every day | ORAL | Status: DC
  Administered 2016-07-21 – 2016-07-22 (×2): 20 mg via ORAL
  Filled 2016-07-21 (×2): qty 1

## 2016-07-21 MED ORDER — LISINOPRIL 5 MG PO TABS
2.5000 mg | ORAL_TABLET | Freq: Every day | ORAL | Status: DC
  Administered 2016-07-21 – 2016-07-22 (×2): 2.5 mg via ORAL
  Filled 2016-07-21 (×2): qty 1

## 2016-07-21 MED ORDER — METOPROLOL SUCCINATE ER 25 MG PO TB24
25.0000 mg | ORAL_TABLET | Freq: Every day | ORAL | Status: DC
  Administered 2016-07-21 – 2016-07-22 (×2): 25 mg via ORAL
  Filled 2016-07-21 (×2): qty 1

## 2016-07-21 MED ORDER — IPRATROPIUM-ALBUTEROL 0.5-2.5 (3) MG/3ML IN SOLN
3.0000 mL | Freq: Four times a day (QID) | RESPIRATORY_TRACT | Status: DC
  Filled 2016-07-21: qty 3

## 2016-07-21 MED ORDER — IBUPROFEN 400 MG PO TABS
600.0000 mg | ORAL_TABLET | Freq: Once | ORAL | Status: AC
  Administered 2016-07-21: 600 mg via ORAL
  Filled 2016-07-21: qty 2

## 2016-07-21 NOTE — Progress Notes (Signed)
Patient becoming SOB on exertion. Oxygen saturations 95%, lungs sound diminished. MD Notified. MD to place orders for chest xray. Will continue to monitor.   Iran Sizer M

## 2016-07-21 NOTE — Progress Notes (Addendum)
Patient's family wants transfer to [facility]. I have called [facility] transfer line.  General Medicine has declined patient as they are on divert.  I also spoke with the Cardiology team, given patient's complaints of fever with thus far  Negative blood cx results, They did not feel that this is a Cardiology admission.  I let patient know above.   TIME SPENT 25 minutes

## 2016-07-21 NOTE — Care Management Important Message (Signed)
Important Message  Patient Details  Name: Christie Carpenter MRN: [id number] Date of Birth: Jun 28, 1945   Medicare Important Message Given:  Yes    Katrina Stack, RN 07/21/2016, 3:18 PM

## 2016-07-21 NOTE — Progress Notes (Addendum)
[place] [facility] [place] NAME: Christie Carpenter    MR#:  [id number]  DATE OF BIRTH:  02/07/1946  SUBJECTIVE:   Patient here with a fever. She has some tooth pain  REVIEW OF SYSTEMS:    Review of Systems  Constitutional: Positive for fever. Negative for chills and malaise/fatigue.       Tooth pain  HENT: Negative.  Negative for ear discharge, ear pain, hearing loss, nosebleeds and sore throat.   Eyes: Negative.  Negative for blurred vision and pain.  Respiratory: Negative.  Negative for cough, hemoptysis, shortness of breath and wheezing.   Cardiovascular: Negative.  Negative for chest pain, palpitations and leg swelling.  Gastrointestinal: Negative.  Negative for abdominal pain, blood in stool, diarrhea, nausea and vomiting.  Genitourinary: Negative.  Negative for dysuria.  Musculoskeletal: Negative.  Negative for back pain.  Skin: Negative.   Neurological: Negative for dizziness, tremors, speech change, focal weakness, seizures and headaches.  Endo/Heme/Allergies: Negative.  Does not bruise/bleed easily.  Psychiatric/Behavioral: Negative.  Negative for depression, hallucinations and suicidal ideas.    Tolerating Diet: yes      DRUG ALLERGIES:   Allergies  Allergen Reactions  . Oxycodone Shortness Of Breath    VITALS:  Blood pressure (!) 142/54, pulse 69, temperature 97.7 F (36.5 C), temperature source Oral, resp. rate 17, height 5\' 5"  (1.651 m), weight 79.5 kg (175 lb 4.8 oz), SpO2 100 %.  PHYSICAL EXAMINATION:   Physical Exam  Constitutional: She is oriented to person, place, and time and well-developed, well-nourished, and in no distress. No distress.  HENT:  Head: Normocephalic.  Eyes: No scleral icterus.  Neck: Normal range of motion. Neck supple. No JVD present. No tracheal deviation present.  Cardiovascular: Normal rate and regular rhythm.  Exam reveals no gallop and no friction rub.   Murmur heard. Pulmonary/Chest:  Effort normal and breath sounds normal. No respiratory distress. She has no wheezes. She has no rales. She exhibits no tenderness.  Abdominal: Soft. Bowel sounds are normal. She exhibits no distension and no mass. There is no tenderness. There is no rebound and no guarding.  Musculoskeletal: Normal range of motion. She exhibits no edema.  Neurological: She is alert and oriented to person, place, and time.  Skin: Skin is warm. No rash noted. No erythema.  Psychiatric: Affect and judgment normal.      LABORATORY PANEL:   CBC  Recent Labs Lab 07/21/16 0555  WBC 8.2  HGB 11.2*  HCT 34.2*  PLT 126*   ------------------------------------------------------------------------------------------------------------------  Chemistries   Recent Labs Lab 07/20/16 1218 07/21/16 0555  NA 133* 137  K 3.8 3.5  CL 101 107  CO2 24 23  GLUCOSE 119* 135*  BUN 14 12  CREATININE 0.82 0.64  CALCIUM 9.2 8.6*  AST 29  --   ALT 16  --   ALKPHOS 67  --   BILITOT 1.8*  --    ------------------------------------------------------------------------------------------------------------------  Cardiac Enzymes No results for input(s): TROPONINI in the last 168 hours. ------------------------------------------------------------------------------------------------------------------  RADIOLOGY:  Dg Chest Port 1 View  Result Date: 07/21/2016 CLINICAL DATA:  Dyspnea EXAM: PORTABLE CHEST 1 VIEW COMPARISON:  09/19/2014 FINDINGS: Borderline cardiomegaly with aortic atherosclerosis. Left-sided ICD apparatus with right atrial and right ventricular leads noted. Coronary sinus lead is also present as well as a right-sided ventricular defibrillator lead. Upper lobe emphysematous hyperinflation with crowding of lower lobe interstitial markings. Minimal subpleural areas of fibrosis are suggested at the lung bases, left greater  than right. No pneumothorax or definite pulmonary consolidation. No effusion. Axillary  clips on the right. No acute osseous abnormality. Osteoarthritis of the [facility] and glenohumeral joints. IMPRESSION: Upper lobe predominant emphysema with crowding of lower lobe interstitial lung markings. Cardiomegaly with aortic atherosclerosis. Suggestion of fibrosis at the lung bases, left greater right. Electronically Signed   By: Ashley Royalty M.D.   On: 07/21/2016 03:29   Ct Renal Stone Study  Result Date: 07/20/2016 CLINICAL DATA:  Right-sided flank pain and hematuria EXAM: CT ABDOMEN AND PELVIS WITHOUT CONTRAST TECHNIQUE: Multidetector CT imaging of the abdomen and pelvis was performed following the standard protocol without IV contrast. COMPARISON:  05/04/2011 FINDINGS: Lower chest: No acute abnormality. Hepatobiliary: Liver is within normal limits. The gallbladder is well distended with multiple gallstones within similar to that seen on prior exam. No biliary dilatation is noted. Pancreas: Unremarkable. No pancreatic ductal dilatation or surrounding inflammatory changes. Spleen: Normal in size without focal abnormality. Adrenals/Urinary Tract: The adrenal glands are within normal limits. No calculi or obstructive changes are noted. The bladder is partially distended. A right renal cyst is seen measuring 2.7 cm in greatest dimension. Stomach/Bowel: Stomach is within normal limits. Appendix appears normal. No evidence of bowel wall thickening, distention, or inflammatory changes. Significant redundancy of the sigmoid colon is noted. Vascular/Lymphatic: Aortic atherosclerosis. No enlarged abdominal or pelvic lymph nodes. Reproductive: Uterus and bilateral adnexa are unremarkable. Other: No abdominal wall hernia or abnormality. No abdominopelvic ascites. Musculoskeletal: No acute or significant osseous findings. IMPRESSION: No renal calculi or obstructive changes are noted. A right renal cyst is seen. Cholelithiasis without complicating factors. No other significant abnormality is noted. Electronically Signed    By: Inez Catalina M.D.   On: 07/20/2016 17:29     ASSESSMENT AND PLAN:    71 year old female with a history of chronic systolic heart failure ejection fraction of 40% and pacemaker who presented with fevers.  1. Fevers: Patient presents with fever and hypotension Unclear etiology at this time. It does not appear that patient has UTI, pneumonia. Her tooth does not appear to be the source of infection either Continue Rocephin and Flagyl for now Follow-up on final blood cultures Check procalcitonin level  2. Hematuria with negative CT stone protocol CT Needs repeat UA   3. Hypotension: This has resolved  4. Chronic systolic heart failure EF 50-55% without exacerbation: I will restart Lasix, lisinopril and metoprolol now that blood pressure has improved. Consider restarting Aldactone in a.m. Okay to stop IV fluids.  5. Hypothyroid: Continue Synthroid  6. Hyponatremia: This is improved with IV fluids  7. 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  Management plans discussed with the patient and she is in agreement.  CODE STATUS: full  TOTAL TIME TAKING CARE OF THIS PATIENT: 30 minutes.     POSSIBLE D/C tomorrow, DEPENDING ON CLINICAL CONDITION.   Tyarra Nolton M.D on 07/21/2016 at 12:43 PM  Between 7am to 6pm - Pager - [phone] After 6pm go to [email or web address] - password [email or web address] [facility]  Office  [phone]  CC: Primary care physician; Ellamae Sia, MD  Note: This dictation was prepared with Dragon dictation along with smaller phrase technology. Any transcriptional errors that result from this process are unintentional.

## 2016-07-22 ENCOUNTER — Inpatient Hospital Stay: Payer: Medicare Other

## 2016-07-22 LAB — BASIC METABOLIC PANEL
ANION GAP: 6 (ref 5–15)
BUN: 10 mg/dL (ref 6–20)
CALCIUM: 8.6 mg/dL — AB (ref 8.9–10.3)
CO2: 26 mmol/L (ref 22–32)
CREATININE: 0.66 mg/dL (ref 0.44–1.00)
Chloride: 106 mmol/L (ref 101–111)
GFR calc non Af Amer: >60 mL/min (ref >60)
Glucose, Bld: 118 mg/dL — ABNORMAL HIGH (ref 65–99)
Potassium: 3.3 mmol/L — ABNORMAL LOW (ref 3.5–5.1)
SODIUM: 138 mmol/L (ref 135–145)

## 2016-07-22 LAB — CBC
HCT: 33.9 % — ABNORMAL LOW (ref 35.0–47.0)
HEMOGLOBIN: 11.4 g/dL — AB (ref 12.0–16.0)
MCH: 32.9 pg (ref 26.0–34.0)
MCHC: 33.5 g/dL (ref 32.0–36.0)
MCV: 98 fL (ref 80.0–100.0)
PLATELETS: 133 10*3/uL — AB (ref 150–440)
RBC: 3.46 MIL/uL — AB (ref 3.80–5.20)
RDW: 13.6 % (ref 11.5–14.5)
WBC: 5.2 10*3/uL (ref 3.6–11.0)

## 2016-07-22 LAB — GLUCOSE, CAPILLARY: GLUCOSE-CAPILLARY: 148 mg/dL — AB (ref 65–99)

## 2016-07-22 MED ORDER — LEVOFLOXACIN 750 MG PO TABS: 750.0000 mg | ORAL_TABLET | Freq: Every day | ORAL | 0 refills | Status: DC

## 2016-07-22 MED ORDER — POTASSIUM CHLORIDE CRYS ER 20 MEQ PO TBCR
40.0000 meq | EXTENDED_RELEASE_TABLET | Freq: Once | ORAL | Status: AC
  Administered 2016-07-22: 40 meq via ORAL
  Filled 2016-07-22: qty 2

## 2016-07-22 NOTE — Discharge Instructions (Signed)
Acetaminophen chewable tablets What is this medicine? ACETAMINOPHEN (a set a MEE noe fen) is a pain reliever. It is used to treat mild pain and fever. This medicine may be used for other purposes; ask your health care provider or pharmacist if you have questions. COMMON BRAND NAME(S): Genapap, Mapap, Pain & Fever, Pain and Fever, PediaCare Children's Smooth Metls Fever Reducer/Pain Reliever, Tylenol Children's What should I tell my health care provider before I take this medicine? They need to know if you have any of these conditions: -if you often drink alcohol -liver disease -phenylketonuria -an unusual or allergic reaction to acetaminophen, other medicines, foods, dyes, or preservatives -pregnant or trying to get pregnant -breast-feeding How should I use this medicine? Take this medicine by mouth. Chew it completely before swallowing. Follow the directions on the package or prescription label. Take your medicine at regular intervals. Do not take your medicine more often than directed. Talk to your pediatrician regarding the use of this medicine in children. While this drug may be prescribed for children as young as 14 years of age for selected conditions, precautions do apply. Overdosage: If you think you have taken too much of this medicine contact a poison control center or emergency room at once. NOTE: This medicine is only for you. Do not share this medicine with others. What if I miss a dose? If you miss a dose, take it as soon as you can. If it is almost time for your next dose, take only that dose. Do not take double or extra doses. What may interact with this medicine? -alcohol -imatinib -isoniazid -other medicines with acetaminophen This list may not describe all possible interactions. Give your health care provider a list of all the medicines, herbs, non-prescription drugs, or dietary supplements you use. Also tell them if you smoke, drink alcohol, or use illegal drugs. Some  items may interact with your medicine. What should I watch for while using this medicine? Tell your doctor or health care professional if the pain lasts more than 10 days (5 days for children), if it gets worse, or if there is a new or different kind of pain. Also, check with your doctor if a fever lasts for more than 3 days. Do not take other medicines that contain acetaminophen with this medicine. Always read labels carefully. If you have questions, ask your doctor or pharmacist. If you take too much acetaminophen get medical help right away. Too much acetaminophen can be very dangerous and cause liver damage. Even if you do not have symptoms, it is important to get help right away. What side effects may I notice from receiving this medicine? Side effects that you should report to your doctor or health care professional as soon as possible: -allergic reactions like skin rash, itching or hives, swelling of the face, lips, or tongue -breathing problems -redness, blistering, peeling or loosening of the skin, including inside the mouth -sore throat with fever, headache, rash, nausea, or vomiting -trouble passing urine or change in the amount of urine -unusual bleeding or bruising -unusually weak or tired -yellowing of the eyes or skin Side effects that usually do not require medical attention (report to your doctor or health care professional if they continue or are bothersome): -headache -nausea, stomach upset This list may not describe all possible side effects. Call your doctor for medical advice about side effects. You may report side effects to FDA at 1-800-FDA-1088. Where should I keep my medicine? Keep out of reach of children. Store at  room temperature between 20 and 25 degrees C (68 and 77 degrees F). Protect from moisture and heat. Throw away any unused medicine after the expiration date. NOTE: This sheet is a summary. It may not cover all possible information. If you have questions  about this medicine, talk to your doctor, pharmacist, or health care provider.  2018 Elsevier/Gold Standard (2012-11-21 12:55:16)

## 2016-07-22 NOTE — Discharge Summary (Signed)
Christie Carpenter: Christie Carpenter    MR#:  [id number]  DATE OF BIRTH:  10-29-1945  DATE OF ADMISSION:  07/20/2016 ADMITTING PHYSICIAN: Epifanio Lesches, MD  DATE OF DISCHARGE: 07/22/2016  PRIMARY CARE PHYSICIAN: Harriett Neita Carp, MD    ADMISSION DIAGNOSIS:  fever and chills ala ems  DISCHARGE DIAGNOSIS:  CAP  SECONDARY DIAGNOSIS:   Past Medical History:  Diagnosis Date  . 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 CHF (congestive heart failure) ([place])   . Coronary artery disease   . Depression   . Diabetes mellitus without complication ([place])   . Heart murmur   . Hypothyroidism   . Mitral valve problem   . Presence of permanent cardiac pacemaker   . Pulmonary hypertension   . Shortness of breath dyspnea     HOSPITAL COURSE:   71 year old female with a history of chronic systolic heart failure ejection fraction of 40% and pacemaker who presented with fevers.  1. CAP: Patient presented with fevers and hypotension. Initial chest x-ray did not show evidence of pneumonia however repeat chest x-ray today does show evidence of right lower lobe pneumonia. She has been afebrile for 48 hours. Her blood cultures have been negative to date. She will be discharged on oral Levaquin.   2. Hematuria with negative CT stone protocol CT Needs repeat UA as an outpatient in one week.  3. Hypotension: This has resolved  4. Chronic systolic heart failure EF 50-55% without exacerbation: She will continue Lasix, lisinopril and metoprolol and Aldactone.   5. Hypothyroid: Continue Synthroid  6. Hyponatremia: This is improved with IV fluids  7. 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   DISCHARGE CONDITIONS AND DIET:  Stable Heart healthy diet  CONSULTS OBTAINED:    DRUG ALLERGIES:   Allergies  Allergen Reactions  . Oxycodone Shortness Of Breath    DISCHARGE MEDICATIONS:   Current Discharge Medication List    START  taking these medications   Details  levofloxacin (LEVAQUIN) 750 MG tablet Take 1 tablet (750 mg total) by mouth daily. Qty: 5 tablet, Refills: 0      CONTINUE these medications which have NOT CHANGED   Details  acetaminophen (TYLENOL) 325 MG tablet Take 2 tablets by mouth every 6 (six) hours.    allopurinol (ZYLOPRIM) 100 MG tablet Take 100 mg by mouth daily.    apixaban (ELIQUIS) 5 MG TABS tablet Take 5 mg by mouth 2 (two) times daily.    atorvastatin (LIPITOR) 80 MG tablet Take 1 tablet by mouth daily.    furosemide (LASIX) 20 MG tablet Take 20 mg by mouth daily.     levothyroxine (SYNTHROID, LEVOTHROID) 100 MCG tablet Take 1 tablet by mouth every morning.    lisinopril (PRINIVIL,ZESTRIL) 2.5 MG tablet Take 1 tablet by mouth daily.    metoprolol succinate (TOPROL-XL) 25 MG 24 hr tablet Take 1 tablet by mouth daily.    spironolactone (ALDACTONE) 25 MG tablet Take 0.5 tablets by mouth daily.    ferrous sulfate 325 (65 FE) MG tablet Take 1 tablet by mouth daily.      STOP taking these medications     gabapentin (NEURONTIN) 300 MG capsule           Today   CHIEF COMPLAINT:  No acute issues overnight no fevers   VITAL SIGNS:  Blood pressure (!) 139/51, pulse 64, temperature 97.8 F (36.6 C), temperature source Oral, resp. rate 16, height 5\' 5"  (  1.651 m), weight 78.3 kg (172 lb 11.2 oz), SpO2 99 %.   REVIEW OF SYSTEMS:  Review of Systems  Constitutional: Negative.  Negative for chills, fever and malaise/fatigue.  HENT: Negative.  Negative for ear discharge, ear pain, hearing loss, nosebleeds and sore throat.   Eyes: Negative.  Negative for blurred vision and pain.  Respiratory: Negative.  Negative for cough, hemoptysis, shortness of breath and wheezing.   Cardiovascular: Negative.  Negative for chest pain, palpitations and leg swelling.  Gastrointestinal: Negative.  Negative for abdominal pain, blood in stool, diarrhea, nausea and vomiting.  Genitourinary:  Negative.  Negative for dysuria.  Musculoskeletal: Negative.  Negative for back pain.  Skin: Negative.   Neurological: Negative for dizziness, tremors, speech change, focal weakness, seizures and headaches.  Endo/Heme/Allergies: Negative.  Does not bruise/bleed easily.  Psychiatric/Behavioral: Negative.  Negative for depression, hallucinations and suicidal ideas.     PHYSICAL EXAMINATION:  GENERAL:  71 y.o.-year-old patient lying in the bed with no acute distress.  NECK:  Supple, no jugular venous distention. No thyroid enlargement, no tenderness.  LUNGS: Normal breath sounds bilaterally, no wheezing, rales,rhonchi  No use of accessory muscles of respiration.  CARDIOVASCULAR: S1, S2 normal. No murmurs, rubs, or gallops.  ABDOMEN: Soft, non-tender, non-distended. Bowel sounds present. No organomegaly or mass.  EXTREMITIES: No pedal edema, cyanosis, or clubbing.  PSYCHIATRIC: The patient is alert and oriented x 3.  SKIN: No obvious rash, lesion, or ulcer.   DATA REVIEW:   CBC  Recent Labs Lab 07/22/16 0434  WBC 5.2  HGB 11.4*  HCT 33.9*  PLT 133*    Chemistries   Recent Labs Lab 07/20/16 1218  07/22/16 0434  NA 133*  < > 138  K 3.8  < > 3.3*  CL 101  < > 106  CO2 24  < > 26  GLUCOSE 119*  < > 118*  BUN 14  < > 10  CREATININE 0.82  < > 0.66  CALCIUM 9.2  < > 8.6*  AST 29  --   --   ALT 16  --   --   ALKPHOS 67  --   --   BILITOT 1.8*  --   --   < > = values in this interval not displayed.  Cardiac Enzymes No results for input(s): TROPONINI in the last 168 hours.  Microbiology Results  @[id number] @  RADIOLOGY:  Dg Chest 1 View  Result Date: 07/22/2016 CLINICAL DATA:  71 year old female with fever. Initial encounter. EXAM: CHEST 1 VIEW COMPARISON:  07/21/2016 and earlier. FINDINGS: Portable AP upright view at 0520 hours. Stable cardiac size and mediastinal contours. Visualized tracheal air column is within normal limits. Stable left chest cardiac AICD. Prior  cardiac valve replacement. Calcified aortic atherosclerosis. No pneumothorax or pulmonary edema. Mildly increased patchy opacity at the right lateral lung base. Lung markings elsewhere are stable. No pleural effusion. IMPRESSION: 1. Mild patchy opacity at the right lateral lung base could reflect atelectasis, but developing pneumonia is difficult to exclude in this clinical setting. No pleural effusion. 2. No other acute cardiopulmonary abnormality. Electronically Signed   By: Genevie Ann M.D.   On: 07/22/2016 07:17   Dg Chest Port 1 View  Result Date: 07/21/2016 CLINICAL DATA:  Dyspnea EXAM: PORTABLE CHEST 1 VIEW COMPARISON:  09/19/2014 FINDINGS: Borderline cardiomegaly with aortic atherosclerosis. Left-sided ICD apparatus with right atrial and right ventricular leads noted. Coronary sinus lead is also present as well as a right-sided ventricular defibrillator lead. Upper lobe emphysematous  hyperinflation with crowding of lower lobe interstitial markings. Minimal subpleural areas of fibrosis are suggested at the lung bases, left greater than right. No pneumothorax or definite pulmonary consolidation. No effusion. Axillary clips on the right. No acute osseous abnormality. Osteoarthritis of the [facility] and glenohumeral joints. IMPRESSION: Upper lobe predominant emphysema with crowding of lower lobe interstitial lung markings. Cardiomegaly with aortic atherosclerosis. Suggestion of fibrosis at the lung bases, left greater right. Electronically Signed   By: Ashley Royalty M.D.   On: 07/21/2016 03:29   Ct Renal Stone Study  Result Date: 07/20/2016 CLINICAL DATA:  Right-sided flank pain and hematuria EXAM: CT ABDOMEN AND PELVIS WITHOUT CONTRAST TECHNIQUE: Multidetector CT imaging of the abdomen and pelvis was performed following the standard protocol without IV contrast. COMPARISON:  05/04/2011 FINDINGS: Lower chest: No acute abnormality. Hepatobiliary: Liver is within normal limits. The gallbladder is well distended with  multiple gallstones within similar to that seen on prior exam. No biliary dilatation is noted. Pancreas: Unremarkable. No pancreatic ductal dilatation or surrounding inflammatory changes. Spleen: Normal in size without focal abnormality. Adrenals/Urinary Tract: The adrenal glands are within normal limits. No calculi or obstructive changes are noted. The bladder is partially distended. A right renal cyst is seen measuring 2.7 cm in greatest dimension. Stomach/Bowel: Stomach is within normal limits. Appendix appears normal. No evidence of bowel wall thickening, distention, or inflammatory changes. Significant redundancy of the sigmoid colon is noted. Vascular/Lymphatic: Aortic atherosclerosis. No enlarged abdominal or pelvic lymph nodes. Reproductive: Uterus and bilateral adnexa are unremarkable. Other: No abdominal wall hernia or abnormality. No abdominopelvic ascites. Musculoskeletal: No acute or significant osseous findings. IMPRESSION: No renal calculi or obstructive changes are noted. A right renal cyst is seen. Cholelithiasis without complicating factors. No other significant abnormality is noted. Electronically Signed   By: Inez Catalina M.D.   On: 07/20/2016 17:29      Current Discharge Medication List    START taking these medications   Details  levofloxacin (LEVAQUIN) 750 MG tablet Take 1 tablet (750 mg total) by mouth daily. Qty: 5 tablet, Refills: 0      CONTINUE these medications which have NOT CHANGED   Details  acetaminophen (TYLENOL) 325 MG tablet Take 2 tablets by mouth every 6 (six) hours.    allopurinol (ZYLOPRIM) 100 MG tablet Take 100 mg by mouth daily.    apixaban (ELIQUIS) 5 MG TABS tablet Take 5 mg by mouth 2 (two) times daily.    atorvastatin (LIPITOR) 80 MG tablet Take 1 tablet by mouth daily.    furosemide (LASIX) 20 MG tablet Take 20 mg by mouth daily.     levothyroxine (SYNTHROID, LEVOTHROID) 100 MCG tablet Take 1 tablet by mouth every morning.    lisinopril  (PRINIVIL,ZESTRIL) 2.5 MG tablet Take 1 tablet by mouth daily.    metoprolol succinate (TOPROL-XL) 25 MG 24 hr tablet Take 1 tablet by mouth daily.    spironolactone (ALDACTONE) 25 MG tablet Take 0.5 tablets by mouth daily.    ferrous sulfate 325 (65 FE) MG tablet Take 1 tablet by mouth daily.      STOP taking these medications     gabapentin (NEURONTIN) 300 MG capsule           Management plans discussed with the patient and she is in agreement. Stable for discharge home  Patient should follow up with pcp  CODE STATUS:     Code Status Orders        Start     Ordered  07/20/16 1831  Full code  Continuous     07/20/16 1832    Code Status History    Date Active Date Inactive Code Status Order ID Comments User Context   01/17/2015  5:09 AM 01/17/2015  9:37 AM Full Code [id number]  Harrie Foreman, MD ED   09/17/2014 11:14 AM 09/20/2014  3:00 PM Full Code [id number]  Aldean Jewett, MD Inpatient      TOTAL TIME TAKING CARE OF THIS PATIENT: 37 minutes.    Note: This dictation was prepared with Dragon dictation along with smaller phrase technology. Any transcriptional errors that result from this process are unintentional.  Tahirih Lair M.D on 07/22/2016 at 12:18 PM  Between 7am to 6pm - Pager - [phone] After 6pm go to [email or web address] - password [facility]  Office  [phone]  CC: Primary care physician; Ellamae Sia, MD

## 2016-07-25 LAB — CULTURE, BLOOD (ROUTINE X 2)
CULTURE: NO GROWTH
SPECIAL REQUESTS: ADEQUATE

## 2016-08-12 ENCOUNTER — Other Ambulatory Visit: Payer: Self-pay | Admitting: Surgery

## 2016-08-12 DIAGNOSIS — R109 Unspecified abdominal pain: Secondary | ICD-10-CM

## 2016-08-18 ENCOUNTER — Ambulatory Visit
Admission: RE | Admit: 2016-08-18 | Discharge: 2016-08-18 | Disposition: A | Discharge status: Home / Self Care | Payer: Medicare Other | Source: Ambulatory Visit | Attending: Surgery | Admitting: Surgery

## 2016-08-18 DIAGNOSIS — K8020 Calculus of gallbladder without cholecystitis without obstruction: Secondary | ICD-10-CM | POA: Insufficient documentation

## 2016-08-18 DIAGNOSIS — R109 Unspecified abdominal pain: Secondary | ICD-10-CM

## 2019-06-19 ENCOUNTER — Inpatient Hospital Stay
Admission: EM | Admit: 2019-06-19 | Discharge: 2019-06-23 | DRG: 418 | Disposition: A | Discharge status: Home / Self Care | Payer: Medicare Other | Attending: Family Medicine | Admitting: Family Medicine

## 2019-06-19 ENCOUNTER — Encounter: Payer: Self-pay | Admitting: Emergency Medicine

## 2019-06-19 ENCOUNTER — Emergency Department: Payer: Medicare Other

## 2019-06-19 ENCOUNTER — Other Ambulatory Visit: Payer: Self-pay

## 2019-06-19 DIAGNOSIS — R011 Cardiac murmur, unspecified: Secondary | ICD-10-CM | POA: Diagnosis present

## 2019-06-19 DIAGNOSIS — Z885 Allergy status to narcotic agent status: Secondary | ICD-10-CM | POA: Diagnosis not present

## 2019-06-19 DIAGNOSIS — K311 Adult hypertrophic pyloric stenosis: Secondary | ICD-10-CM | POA: Diagnosis present

## 2019-06-19 DIAGNOSIS — K8021 Calculus of gallbladder without cholecystitis with obstruction: Secondary | ICD-10-CM

## 2019-06-19 DIAGNOSIS — K8066 Calculus of gallbladder and bile duct with acute and chronic cholecystitis without obstruction: Principal | ICD-10-CM | POA: Diagnosis present

## 2019-06-19 DIAGNOSIS — I480 Paroxysmal atrial fibrillation: Secondary | ICD-10-CM | POA: Diagnosis present

## 2019-06-19 DIAGNOSIS — Z7901 Long term (current) use of anticoagulants: Secondary | ICD-10-CM | POA: Diagnosis not present

## 2019-06-19 DIAGNOSIS — Z419 Encounter for procedure for purposes other than remedying health state, unspecified: Secondary | ICD-10-CM

## 2019-06-19 DIAGNOSIS — Z20822 Contact with and (suspected) exposure to covid-19: Secondary | ICD-10-CM | POA: Diagnosis present

## 2019-06-19 DIAGNOSIS — Z888 Allergy status to other drugs, medicaments and biological substances status: Secondary | ICD-10-CM | POA: Diagnosis not present

## 2019-06-19 DIAGNOSIS — I272 Pulmonary hypertension, unspecified: Secondary | ICD-10-CM | POA: Diagnosis present

## 2019-06-19 DIAGNOSIS — E119 Type 2 diabetes mellitus without complications: Secondary | ICD-10-CM | POA: Diagnosis present

## 2019-06-19 DIAGNOSIS — R1011 Right upper quadrant pain: Secondary | ICD-10-CM | POA: Diagnosis present

## 2019-06-19 DIAGNOSIS — I080 Rheumatic disorders of both mitral and aortic valves: Secondary | ICD-10-CM | POA: Diagnosis present

## 2019-06-19 DIAGNOSIS — I110 Hypertensive heart disease with heart failure: Secondary | ICD-10-CM | POA: Diagnosis present

## 2019-06-19 DIAGNOSIS — E039 Hypothyroidism, unspecified: Secondary | ICD-10-CM | POA: Diagnosis present

## 2019-06-19 DIAGNOSIS — Z95810 Presence of automatic (implantable) cardiac defibrillator: Secondary | ICD-10-CM | POA: Diagnosis not present

## 2019-06-19 DIAGNOSIS — K8050 Calculus of bile duct without cholangitis or cholecystitis without obstruction: Secondary | ICD-10-CM

## 2019-06-19 DIAGNOSIS — K8001 Calculus of gallbladder with acute cholecystitis with obstruction: Secondary | ICD-10-CM | POA: Diagnosis not present

## 2019-06-19 DIAGNOSIS — I5042 Chronic combined systolic (congestive) and diastolic (congestive) heart failure: Secondary | ICD-10-CM | POA: Diagnosis present

## 2019-06-19 DIAGNOSIS — R945 Abnormal results of liver function studies: Secondary | ICD-10-CM | POA: Diagnosis not present

## 2019-06-19 DIAGNOSIS — Z79890 Hormone replacement therapy (postmenopausal): Secondary | ICD-10-CM

## 2019-06-19 DIAGNOSIS — K660 Peritoneal adhesions (postprocedural) (postinfection): Secondary | ICD-10-CM | POA: Diagnosis present

## 2019-06-19 DIAGNOSIS — Z87891 Personal history of nicotine dependence: Secondary | ICD-10-CM

## 2019-06-19 DIAGNOSIS — I2510 Atherosclerotic heart disease of native coronary artery without angina pectoris: Secondary | ICD-10-CM | POA: Diagnosis present

## 2019-06-19 DIAGNOSIS — Z8572 Personal history of non-Hodgkin lymphomas: Secondary | ICD-10-CM | POA: Diagnosis not present

## 2019-06-19 DIAGNOSIS — I509 Heart failure, unspecified: Secondary | ICD-10-CM

## 2019-06-19 DIAGNOSIS — I5032 Chronic diastolic (congestive) heart failure: Secondary | ICD-10-CM | POA: Diagnosis not present

## 2019-06-19 DIAGNOSIS — K819 Cholecystitis, unspecified: Secondary | ICD-10-CM

## 2019-06-19 DIAGNOSIS — K8000 Calculus of gallbladder with acute cholecystitis without obstruction: Secondary | ICD-10-CM | POA: Diagnosis present

## 2019-06-19 LAB — CBC WITH DIFFERENTIAL/PLATELET
Abs Immature Granulocytes: 0.02 10*3/uL (ref 0.00–0.07)
Basophils Absolute: 0 10*3/uL (ref 0.0–0.1)
Basophils Relative: 0 %
Eosinophils Absolute: 0.1 10*3/uL (ref 0.0–0.5)
Eosinophils Relative: 2 %
HCT: 42.1 % (ref 36.0–46.0)
Hemoglobin: 13.7 g/dL (ref 12.0–15.0)
Immature Granulocytes: 0 %
Lymphocytes Relative: 20 %
Lymphs Abs: 1.4 10*3/uL (ref 0.7–4.0)
MCH: 32.9 pg (ref 26.0–34.0)
MCHC: 32.5 g/dL (ref 30.0–36.0)
MCV: 101.2 fL — ABNORMAL HIGH (ref 80.0–100.0)
Monocytes Absolute: 0.7 10*3/uL (ref 0.1–1.0)
Monocytes Relative: 10 %
Neutro Abs: 4.7 10*3/uL (ref 1.7–7.7)
Neutrophils Relative %: 68 %
Platelets: 217 10*3/uL (ref 150–400)
RBC: 4.16 MIL/uL (ref 3.87–5.11)
RDW: 13.4 % (ref 11.5–15.5)
WBC: 6.9 10*3/uL (ref 4.0–10.5)
nRBC: 0 % (ref 0.0–0.2)

## 2019-06-19 LAB — COMPREHENSIVE METABOLIC PANEL
ALT: 1057 U/L — ABNORMAL HIGH (ref 0–44)
AST: 1625 U/L — ABNORMAL HIGH (ref 15–41)
Albumin: 4 g/dL (ref 3.5–5.0)
Alkaline Phosphatase: 121 U/L (ref 38–126)
Anion gap: 8 (ref 5–15)
BUN: 17 mg/dL (ref 8–23)
CO2: 28 mmol/L (ref 22–32)
Calcium: 9.4 mg/dL (ref 8.9–10.3)
Chloride: 101 mmol/L (ref 98–111)
Creatinine, Ser: 0.7 mg/dL (ref 0.44–1.00)
GFR calc Af Amer: >60 mL/min (ref >60)
GFR calc non Af Amer: >60 mL/min (ref >60)
Glucose, Bld: 120 mg/dL — ABNORMAL HIGH (ref 70–99)
Potassium: 4.2 mmol/L (ref 3.5–5.1)
Sodium: 137 mmol/L (ref 135–145)
Total Bilirubin: 3.1 mg/dL — ABNORMAL HIGH (ref 0.3–1.2)
Total Protein: 7.2 g/dL (ref 6.5–8.1)

## 2019-06-19 LAB — URINALYSIS, COMPLETE (UACMP) WITH MICROSCOPIC
Bacteria, UA: NONE SEEN
Bilirubin Urine: NEGATIVE
Glucose, UA: NEGATIVE mg/dL
Ketones, ur: NEGATIVE mg/dL
Leukocytes,Ua: NEGATIVE
Nitrite: NEGATIVE
Protein, ur: NEGATIVE mg/dL
Specific Gravity, Urine: >1.046 — ABNORMAL HIGH (ref 1.005–1.030)
pH: 7 (ref 5.0–8.0)

## 2019-06-19 LAB — ACETAMINOPHEN LEVEL: Acetaminophen (Tylenol), Serum: <10 ug/mL — ABNORMAL LOW (ref 10–30)

## 2019-06-19 LAB — CK: Total CK: 60 U/L (ref 38–234)

## 2019-06-19 LAB — RESPIRATORY PANEL BY RT PCR (FLU A&B, COVID)
Influenza A by PCR: NEGATIVE
Influenza B by PCR: NEGATIVE
SARS Coronavirus 2 by RT PCR: NEGATIVE

## 2019-06-19 LAB — HEPATITIS B CORE ANTIBODY, IGM: Hep B C IgM: NONREACTIVE

## 2019-06-19 LAB — PROTIME-INR
INR: 0.9 (ref 0.8–1.2)
Prothrombin Time: 12.2 seconds (ref 11.4–15.2)

## 2019-06-19 LAB — HEPATITIS A ANTIBODY, IGM: Hep A IgM: NONREACTIVE

## 2019-06-19 LAB — HEPATITIS B SURFACE ANTIGEN: Hepatitis B Surface Ag: NONREACTIVE

## 2019-06-19 LAB — LIPASE, BLOOD: Lipase: 167 U/L — ABNORMAL HIGH (ref 11–51)

## 2019-06-19 LAB — HEPATITIS B CORE ANTIBODY, TOTAL: Hep B Core Total Ab: NONREACTIVE

## 2019-06-19 MED ORDER — PIPERACILLIN-TAZOBACTAM 3.375 G IVPB
3.3750 g | Freq: Three times a day (TID) | INTRAVENOUS | Status: DC
  Administered 2019-06-19 – 2019-06-23 (×12): 3.375 g via INTRAVENOUS
  Filled 2019-06-19 (×11): qty 50

## 2019-06-19 MED ORDER — SODIUM CHLORIDE 0.9 % IV SOLN: Freq: Once | INTRAVENOUS | Status: AC

## 2019-06-19 MED ORDER — SODIUM CHLORIDE 0.9 % IV SOLN: INTRAVENOUS | Status: DC

## 2019-06-19 MED ORDER — ONDANSETRON HCL 4 MG/2ML IJ SOLN
4.0000 mg | Freq: Four times a day (QID) | INTRAMUSCULAR | Status: DC | PRN
  Administered 2019-06-22: 4 mg via INTRAVENOUS

## 2019-06-19 MED ORDER — MORPHINE SULFATE (PF) 4 MG/ML IV SOLN
4.0000 mg | Freq: Once | INTRAVENOUS | Status: AC
  Administered 2019-06-19: 10:00:00 4 mg via INTRAVENOUS
  Filled 2019-06-19: qty 1

## 2019-06-19 MED ORDER — IOHEXOL 300 MG/ML  SOLN
100.0000 mL | Freq: Once | INTRAMUSCULAR | Status: AC | PRN
  Administered 2019-06-19: 100 mL via INTRAVENOUS

## 2019-06-19 MED ORDER — ONDANSETRON HCL 4 MG/2ML IJ SOLN
INTRAMUSCULAR | Status: AC
  Filled 2019-06-19: qty 2

## 2019-06-19 MED ORDER — ONDANSETRON HCL 4 MG PO TABS: 4.0000 mg | ORAL_TABLET | Freq: Four times a day (QID) | ORAL | Status: DC | PRN

## 2019-06-19 MED ORDER — METOPROLOL SUCCINATE ER 25 MG PO TB24
25.0000 mg | ORAL_TABLET | Freq: Every day | ORAL | Status: DC
  Administered 2019-06-20 – 2019-06-23 (×4): 25 mg via ORAL
  Filled 2019-06-19 (×4): qty 1

## 2019-06-19 MED ORDER — LISINOPRIL 2.5 MG PO TABS
2.5000 mg | ORAL_TABLET | Freq: Every day | ORAL | Status: DC
  Administered 2019-06-20 – 2019-06-23 (×3): 2.5 mg via ORAL
  Filled 2019-06-19 (×2): qty 1
  Filled 2019-06-19 (×2): qty 0.5

## 2019-06-19 MED ORDER — ADULT MULTIVITAMIN W/MINERALS CH
1.0000 | ORAL_TABLET | Freq: Every day | ORAL | Status: DC
  Administered 2019-06-20 – 2019-06-23 (×3): 1 via ORAL
  Filled 2019-06-19 (×3): qty 1

## 2019-06-19 MED ORDER — ONDANSETRON HCL 4 MG/2ML IJ SOLN
4.0000 mg | Freq: Once | INTRAMUSCULAR | Status: AC
  Administered 2019-06-19: 10:00:00 4 mg via INTRAVENOUS

## 2019-06-19 MED ORDER — PIPERACILLIN-TAZOBACTAM 3.375 G IVPB 30 MIN
3.3750 g | Freq: Once | INTRAVENOUS | Status: AC
  Administered 2019-06-19: 3.375 g via INTRAVENOUS
  Filled 2019-06-19: qty 50

## 2019-06-19 MED ORDER — ONDANSETRON HCL 4 MG/2ML IJ SOLN
4.0000 mg | Freq: Once | INTRAMUSCULAR | Status: AC
  Administered 2019-06-19: 10:00:00 4 mg via INTRAVENOUS
  Filled 2019-06-19: qty 2

## 2019-06-19 MED ORDER — SPIRONOLACTONE 25 MG PO TABS
12.5000 mg | ORAL_TABLET | Freq: Every day | ORAL | Status: DC
  Administered 2019-06-20 – 2019-06-23 (×3): 12.5 mg via ORAL
  Filled 2019-06-19: qty 1
  Filled 2019-06-19 (×2): qty 0.5
  Filled 2019-06-19: qty 1
  Filled 2019-06-19 (×2): qty 0.5
  Filled 2019-06-19: qty 1

## 2019-06-19 MED ORDER — LEVOTHYROXINE SODIUM 112 MCG PO TABS
112.0000 ug | ORAL_TABLET | Freq: Every morning | ORAL | Status: DC
  Administered 2019-06-22 – 2019-06-23 (×2): 112 ug via ORAL
  Filled 2019-06-19 (×5): qty 1

## 2019-06-19 NOTE — ED Notes (Signed)
Covid swab collected and sent to lab.

## 2019-06-19 NOTE — ED Notes (Addendum)
Pt denies needs currently. Bed locked low. Rails up. Call bell within reach. Pt on phone with her daughter. Reminded that urine sample is needed. Pt states she will notify this RN when she thinks she can provide sample.

## 2019-06-19 NOTE — ED Notes (Signed)
Attempted 22g IV at L ac without success. Pt hard stick.

## 2019-06-19 NOTE — Consult Note (Addendum)
[facility] SURGICAL CONSULTATION NOTE (initial) - cpt: [id number]   HISTORY OF PRESENT ILLNESS (HPI):  74 y.o. female presented to [facility] ED today for evaluation of abdominal pain. Patient reports that around 630 PM last night she noticed the acute onset of initially left sided abdominal pain which then spread across to the right side of her abdomen. She has a difficult time describing the pain. She tried TUMS without any relief. She notes associate nausea, non-bloody non-bilious emesis, chills, and diaphoresis with this pain. No fever, chills, cough, jaundice, or acholic stools. She notes a history of similar pain in the past (many years ago) in which she was diagnosed with cholelithiasis. She followed up with a surgeon for this but never required cholecystectomy. She reports that she was doing fine in the interim until last night. Work up in the ED was concerning for significant elevation in LFTs (AST - 1,625, ALT - 1,057) with normal alk phos, elevated lipase to 167, and hyperbilirubinemia to 3.1. She underwent CT Abdomen/Pelvis which showed cholelithiasis and possible gallbladder wall thickening. She underwent RUQ [place] following this which again showed cholelithiasis but no evidence of cholecystitis and CBD measured 5 mm.   Surgery is consulted by emergency medicine physician Dr. Lenise Arena, MD in this context for evaluation and management of cholelithiasis in the setting of elevated LFTs and hyperbilirubinemia.   PAST MEDICAL HISTORY (PMH):  Past Medical History:  Diagnosis Date  . Cancer ([place]) 1987   non hodgkins lymphoma  . CHF (congestive heart failure) ([place])   . Coronary artery disease   . Depression   . Diabetes mellitus without complication ([place])   . Heart murmur   . Hypothyroidism   . Mitral valve problem   . Presence of permanent cardiac pacemaker   . Pulmonary hypertension ([place])   . Shortness of breath dyspnea      PAST SURGICAL HISTORY ([place]):  Past Surgical  History:  Procedure Laterality Date  . PACEMAKER INSERTION       MEDICATIONS:  Prior to Admission medications   Medication Sig Start Date End Date Taking? Authorizing Provider  apixaban (ELIQUIS) 5 MG TABS tablet Take 5 mg by mouth 2 (two) times daily.   Yes [provider]  atorvastatin (LIPITOR) 80 MG tablet Take 1 tablet by mouth daily. 11/26/14 06/19/19 Yes [provider]  ezetimibe (ZETIA) 10 MG tablet Take 10 mg by mouth daily. 12/20/18 12/20/19 Yes [provider]  furosemide (LASIX) 20 MG tablet Take 20 mg by mouth daily as needed for fluid.    Yes [provider]  levothyroxine (SYNTHROID) 112 MCG tablet Take 112 mcg by mouth every morning.  12/01/14 06/19/19 Yes [provider]  lisinopril (PRINIVIL,ZESTRIL) 2.5 MG tablet Take 1 tablet by mouth daily. 12/01/14 06/19/19 Yes [provider]  metoprolol succinate (TOPROL-XL) 25 MG 24 hr tablet Take 1 tablet by mouth daily. 12/25/14 06/19/19 Yes [provider]  Multiple Vitamin (MULTIVITAMIN WITH MINERALS) TABS tablet Take 1 tablet by mouth daily.   Yes [provider]  spironolactone (ALDACTONE) 25 MG tablet Take 0.5 tablets by mouth daily. 12/01/14 06/19/19 Yes [provider]     ALLERGIES:  Allergies  Allergen Reactions  . Oxycodone Shortness Of Breath  . Bupropion Itching     SOCIAL HISTORY:  Social History   Socioeconomic History  . Marital status: Widowed    Spouse name: Not on file  . Number of children: Not on file  . Years of education: Not  on file  . Highest education level: Not on file  Occupational History  . Not on file  Tobacco Use  . Smoking status: Former Research scientist (life sciences)  . Smokeless tobacco: Never Used  Substance and Sexual Activity  . Alcohol use: No  . Drug use: No  . Sexual activity: Not on file  Other Topics Concern  . Not on file  Social History Narrative  . Not on file   Social Determinants of Health   Financial Resource Strain:    . Difficulty of Paying Living Expenses: Not on file  Food Insecurity:   . Worried About Charity fundraiser in the Last Year: Not on file  . Ran Out of Food in the Last Year: Not on file  Transportation Needs:   . Lack of Transportation (Medical): Not on file  . Lack of Transportation (Non-Medical): Not on file  Physical Activity:   . Days of Exercise per Week: Not on file  . Minutes of Exercise per Session: Not on file  Stress:   . Feeling of Stress : Not on file  Social Connections:   . Frequency of Communication with Friends and Family: Not on file  . Frequency of Social Gatherings with Friends and Family: Not on file  . Attends Religious Services: Not on file  . Active Member of Clubs or Organizations: Not on file  . Attends Archivist Meetings: Not on file  . Marital Status: Not on file  Intimate Partner Violence:   . Fear of Current or Ex-Partner: Not on file  . Emotionally Abused: Not on file  . Physically Abused: Not on file  . Sexually Abused: Not on file     FAMILY HISTORY:  Family History  Problem Relation Age of Onset  . Leukemia Other       REVIEW OF SYSTEMS:  Review of Systems  Constitutional: Positive for chills and diaphoresis. Negative for fever.  HENT: Negative for congestion and sore throat.   Respiratory: Negative for cough and shortness of breath.   Cardiovascular: Negative for chest pain and palpitations.  Gastrointestinal: Positive for abdominal pain, nausea and vomiting. Negative for blood in stool, constipation, diarrhea and melena.  Genitourinary: Negative for dysuria and urgency.  All other systems reviewed and are negative.   VITAL SIGNS:  Temp:  [97.7 F (36.5 C)] 97.7 F (36.5 C) (03/08 0923) Pulse Rate:  [69-87] 81 (03/08 1545) BP: (103-178)/(51-91) 124/65 (03/08 1530) SpO2:  [97 %-100 %] 97 % (03/08 1515) Weight:  [81.6 kg] 81.6 kg (03/08 0917)     Height: '5\' 5"'  (165.1 cm) Weight: 81.6 kg BMI (Calculated): 29.95    INTAKE/OUTPUT:  No intake/output data recorded.  PHYSICAL EXAM:  Physical Exam Vitals and nursing note reviewed.  Constitutional:      Appearance: She is well-developed. She is obese.  HENT:     Head: Normocephalic and atraumatic.  Eyes:     General: No scleral icterus.    Extraocular Movements: Extraocular movements intact.  Cardiovascular:     Rate and Rhythm: Normal rate.     Heart sounds: Normal heart sounds. No murmur.  Pulmonary:     Effort: Pulmonary effort is normal. No respiratory distress.     Breath sounds: Normal breath sounds. No wheezing.  Abdominal:     General: There is no distension.     Palpations: Abdomen is soft.     Tenderness: There is abdominal tenderness (Mild) in the right upper quadrant. There is no guarding or rebound. Negative  signs include Murphy's sign.  Genitourinary:    Comments: Deferred Skin:    General: Skin is warm and dry.     Coloration: Skin is not jaundiced.  Neurological:     General: No focal deficit present.     Mental Status: She is alert and oriented to person, place, and time.  Psychiatric:        Mood and Affect: Mood normal.        Behavior: Behavior normal.      Labs:  CBC Latest Ref Rng & Units 06/19/2019 07/22/2016 07/21/2016  WBC 4.0 - 10.5 K/uL 6.9 5.2 8.2  Hemoglobin 12.0 - 15.0 g/dL 13.7 11.4(L) 11.2(L)  Hematocrit 36.0 - 46.0 % 42.1 33.9(L) 34.2(L)  Platelets 150 - 400 K/uL 217 133(L) 126(L)   CMP Latest Ref Rng & Units 06/19/2019 07/22/2016 07/21/2016  Glucose 70 - 99 mg/dL 120(H) 118(H) 135(H)  BUN 8 - 23 mg/dL '17 10 12  ' Creatinine 0.44 - 1.00 mg/dL 0.70 0.66 0.64  Sodium 135 - 145 mmol/L 137 138 137  Potassium 3.5 - 5.1 mmol/L 4.2 3.3(L) 3.5  Chloride 98 - 111 mmol/L 101 106 107  CO2 22 - 32 mmol/L '28 26 23  ' Calcium 8.9 - 10.3 mg/dL 9.4 8.6(L) 8.6(L)  Total Protein 6.5 - 8.1 g/dL 7.2 - -  Total Bilirubin 0.3 - 1.2 mg/dL 3.1(H) - -  Alkaline Phos 38 - 126 U/L 121 - -  AST 15 - 41 U/L 1,625(H) - -  ALT 0 -  44 U/L 1,057(H) - -    Imaging studies:   CT Abdomen/Pelvis (06/19/2019) personally reviewed which shows cholelithiasis with very mild gallbladder wall thickening but no peri-cholecystic fluid or stranding suggestive of cholecystitis, and radiologist report reviewed below:  IMPRESSION: 1. Gallbladder wall thickening with gallstones, consistent with early acute cholecystitis in the proper clinical setting. Consider further assessment with limited right upper quadrant ultrasound. 2. No other evidence of an acute abnormality within the abdomen or pelvis. 3. Mild increased colonic stool burden.  No bowel inflammation.   RUQ [place] (06/19/2019) personally reviewed which shows cholelithiasis without cholecystitis and normal caliber CBD, and radiologist report reviewed below:  IMPRESSION: 1. Cholelithiasis with borderline gallbladder wall thickening. No pericholecystic fluid. Negative sonographic Percell Miller sign though patient is medicated. Findings are indeterminate for acute cholecystitis.  2. Liver parenchymal echogenicity may be slightly increased as can be seen in early fatty infiltration.   Assessment/Plan: (ICD-10's: K64.21) 74 y.o. female with abdominal pain, cholelithiasis, elevated LFTs, and hyperbilirubinemia which are clinically concerning for possible retained vs passed CBD stone without evidence suggestive of cholecystitis, complicated by multiple pertinent comorbidities.   - Recommend admission to medicine service  - Consult GI given elevated LFTs and hyperbilirubinemia in the setting of cholelithiasis for possible CBD stone. Typically we would also recommend MRCP however her history of pacemaker does not allow for this. In the interim recommend trending LFTs and bilirubin.  - She will benefit from cholecystectomy, likely with IOC for above reasons, at some point this admission. No emergent indications. Timing per Dr Celine Ahr.   - She is on Eliquis, so we will have to wait at least 48  hours prior to any intervention. Unsure when she last took this. This will need to be held until after surgery is completed. If therapeutic anticoagulation is required, consider either LMWH or heparin infusion. Suggest cardiology consult for surgical clearance as well.   - As long as no current plans for GI intervention, she should be okay  to have CLD for now  - IVF resuscitation  - Pain control prn; antiemetics prn  - monitor abdominal examination  - trend LFTs, bilirubin   - Further management per primary team; we will follow   All of the above findings and recommendations were discussed with the patient, and all of patient's questions were answered to her expressed satisfaction.  Thank you for the opportunity to participate in this patient's care.   -- Fredirick Maudlin, PA-C [facility] 06/19/2019, 4:20 PM [phone] M-F: 7am - 4pm  I saw and evaluated the patient.  I agree with the above documentation, exam, and plan, which I have edited where appropriate. Fredirick Maudlin  4:27 PM

## 2019-06-19 NOTE — Consult Note (Signed)
Pharmacy Antibiotic Note  Christie Carpenter is a 74 y.o. female admitted on 06/19/2019 with Intra-abdominal Infection.  Pharmacy has been consulted for Zosyn dosing.  Plan: Zosyn  3.375 g Q8H (extended infusion)   Height: 5\' 5"  (165.1 cm) Weight: 180 lb (81.6 kg) IBW/kg (Calculated) : 57  Temp (24hrs), Avg:97.7 F (36.5 C), Min:97.7 F (36.5 C), Max:97.7 F (36.5 C)  Recent Labs  Lab 06/19/19 0955 06/19/19 1131  WBC 6.9  --   CREATININE  --  0.70    Estimated Creatinine Clearance: 65.1 mL/min (by C-G formula based on SCr of 0.7 mg/dL).    Allergies  Allergen Reactions  . Oxycodone Shortness Of Breath  . Bupropion Itching    Antimicrobials this admission:  Dose adjustments this admission:   Microbiology results:   Thank you for allowing pharmacy to be a part of this patient's care.  Rowland Lathe 06/19/2019 4:20 PM

## 2019-06-19 NOTE — H&P (Addendum)
History and Physical    Johnni Lucatero [id number] DOB: 1945-09-23 DOA: 06/19/2019  PCP: Ellamae Sia, MD   Patient coming from: Home  I have personally briefly reviewed patient's old medical records in [place]  Chief Complaint: Abdominal pain  HPI: Zenola Vroman is a 74 y.o. female with medical history significant for non-Hodgkin's lymphoma, CHF, coronary artery disease, diabetes mellitus, S/p mitral valve repair, hypothyroidism, history of atrial fibrillation status post pacemaker/AICD insertion who presents to the emergency room for evaluation of abdominal pain which started in her LLQ and radiated to the right upper quadrant. Pain started several hours after a meal. She rated her pain an 8 x 10 in intensity at its worst.  Pain has no relieving or aggravating factors. Patient said she has had episodes of pain like this in the past but not so intense. Abdominal pain has been associated with nausea, vomiting and diaphoresis. She denies having any changes in her bowel habits. She denies having any chest pain, shortness of breath, palpitations, fever, chills, tinnitus or lightheadedness.  Labs revealed elevated liver enzymes in the 1000/s, with elevated total bilirubin and normal alkaline phosphatase level.  CT scan of the abdomen and pelvis showed gallbladder wall thickening and cholelithiasis consistent with early cholecystitis.  Gallbladder ultrasound showed cholelithiasis  ED Course: The emergency room for evaluation of right upper quadrant quadrant pain, labs indicated significant transaminitis and elevated lipase suspicious for choledocholithiasis.  Bilirubin was elevated at 3.1 and CT scan was suggestive of cholelithiasis  Review of Systems: As per HPI otherwise 10 point review of systems negative.    Past Medical History:  Diagnosis Date  . Cancer ([place]) 1987   non hodgkins lymphoma  . CHF (congestive heart failure) ([place])   . Coronary artery disease   . Depression   .  Diabetes mellitus without complication ([place])   . Heart murmur   . Hypothyroidism   . Mitral valve problem   . Presence of permanent cardiac pacemaker   . Pulmonary hypertension ([place])   . Shortness of breath dyspnea     Past Surgical History:  Procedure Laterality Date  . PACEMAKER INSERTION       reports that she has quit smoking. She has never used smokeless tobacco. She reports that she does not drink alcohol or use drugs.  Allergies  Allergen Reactions  . Oxycodone Shortness Of Breath  . Bupropion Itching    Family History  Problem Relation Age of Onset  . Leukemia Other      Prior to Admission medications   Medication Sig Start Date End Date Taking? Authorizing Provider  apixaban (ELIQUIS) 5 MG TABS tablet Take 5 mg by mouth 2 (two) times daily.   Yes [provider]  atorvastatin (LIPITOR) 80 MG tablet Take 1 tablet by mouth daily. 11/26/14 06/19/19 Yes [provider]  ezetimibe (ZETIA) 10 MG tablet Take 10 mg by mouth daily. 12/20/18 12/20/19 Yes [provider]  furosemide (LASIX) 20 MG tablet Take 20 mg by mouth daily as needed for fluid.    Yes [provider]  levothyroxine (SYNTHROID) 112 MCG tablet Take 112 mcg by mouth every morning.  12/01/14 06/19/19 Yes [provider]  lisinopril (PRINIVIL,ZESTRIL) 2.5 MG tablet Take 1 tablet by mouth daily. 12/01/14 06/19/19 Yes [provider]  metoprolol succinate (TOPROL-XL) 25 MG 24 hr tablet Take 1 tablet by mouth daily. 12/25/14 06/19/19 Yes [provider]  Multiple Vitamin (MULTIVITAMIN WITH MINERALS) TABS tablet Take 1 tablet by  mouth daily.   Yes [provider]  spironolactone (ALDACTONE) 25 MG tablet Take 0.5 tablets by mouth daily. 12/01/14 06/19/19 Yes [provider]    Physical Exam: Vitals:   06/19/19 1545 06/19/19 1554 06/19/19 1615 06/19/19 1627  BP:      Pulse: 81 76 77 72  Temp:      TempSrc:      SpO2:  100% 100% 100%  Weight:       Height:         Vitals:   06/19/19 1545 06/19/19 1554 06/19/19 1615 06/19/19 1627  BP:      Pulse: 81 76 77 72  Temp:      TempSrc:      SpO2:  100% 100% 100%  Weight:      Height:        Constitutional: NAD, alert and oriented to person, place and time Eyes: PERRL, lids and conjunctivae normal ENMT: Mucous membranes are moist.  Neck: normal, supple, no masses, no thyromegaly Respiratory: clear to auscultation bilaterally, no wheezing, no crackles. Normal respiratory effort. No accessory muscle use.  Cardiovascular: Regular rate and rhythm, no murmurs / rubs / gallops. No extremity edema. 2+ pedal pulses. No carotid bruits.  Abdomen: RUQ tenderness, no masses palpated. hepatosplenomegaly. Bowel sounds positive.  Musculoskeletal: no clubbing / cyanosis. No joint deformity upper and lower extremities.  Skin: no rashes, lesions, ulcers.  Neurologic: No gross focal neurologic deficit. Psychiatric: Normal mood and affect.   Labs on Admission: I have personally reviewed following labs and imaging studies  CBC: Recent Labs  Lab 06/19/19 0955  WBC 6.9  NEUTROABS 4.7  HGB 13.7  HCT 42.1  MCV 101.2*  PLT [id number]   Basic Metabolic Panel: Recent Labs  Lab 06/19/19 1131  NA 137  K 4.2  CL 101  CO2 28  GLUCOSE 120*  BUN 17  CREATININE 0.70  CALCIUM 9.4   GFR: Estimated Creatinine Clearance: 65.1 mL/min (by C-G formula based on SCr of 0.7 mg/dL). Liver Function Tests: Recent Labs  Lab 06/19/19 1131  AST 1,625*  ALT 1,057*  ALKPHOS 121  BILITOT 3.1*  PROT 7.2  ALBUMIN 4.0   Recent Labs  Lab 06/19/19 1131  LIPASE 167*   No results for input(s): AMMONIA in the last 168 hours. Coagulation Profile: Recent Labs  Lab 06/19/19 1547  INR 0.9   Cardiac Enzymes: Recent Labs  Lab 06/19/19 1547  CKTOTAL 60   BNP (last 3 results) No results for input(s): PROBNP in the last 8760 hours. HbA1C: No results for input(s): HGBA1C in the last 72 hours. CBG: No  results for input(s): GLUCAP in the last 168 hours. Lipid Profile: No results for input(s): CHOL, HDL, LDLCALC, TRIG, CHOLHDL, LDLDIRECT in the last 72 hours. Thyroid Function Tests: No results for input(s): TSH, T4TOTAL, FREET4, T3FREE, THYROIDAB in the last 72 hours. Anemia Panel: No results for input(s): VITAMINB12, FOLATE, FERRITIN, TIBC, IRON, RETICCTPCT in the last 72 hours. Urine analysis:    Component Value Date/Time   COLORURINE YELLOW (A) 06/19/2019 1456   APPEARANCEUR CLEAR (A) 06/19/2019 1456   APPEARANCEUR Hazy 02/08/2012 1546   LABSPEC >1.046 (H) 06/19/2019 1456   LABSPEC 1.017 02/08/2012 1546   PHURINE 7.0 06/19/2019 1456   GLUCOSEU NEGATIVE 06/19/2019 1456   GLUCOSEU Negative 02/08/2012 1546   HGBUR MODERATE (A) 06/19/2019 1456   BILIRUBINUR NEGATIVE 06/19/2019 1456   BILIRUBINUR Negative 02/08/2012 1546   KETONESUR NEGATIVE 06/19/2019 1456   PROTEINUR NEGATIVE 06/19/2019 1456  NITRITE NEGATIVE 06/19/2019 [place] 06/19/2019 1456   LEUKOCYTESUR Trace 02/08/2012 1546    Radiological Exams on Admission: CT ABDOMEN PELVIS W CONTRAST  Result Date: 06/19/2019 CLINICAL DATA:  Abdominal pain beginning last night with nausea. History of non-Hodgkin's lymphoma. EXAM: CT ABDOMEN AND PELVIS WITH CONTRAST TECHNIQUE: Multidetector CT imaging of the abdomen and pelvis was performed using the standard protocol following bolus administration of intravenous contrast. CONTRAST:  120mL OMNIPAQUE IOHEXOL 300 MG/ML  SOLN COMPARISON:  07/20/2016 FINDINGS: Lower chest: No acute abnormality. Hepatobiliary: Normal liver. Gallbladder is distended with mild wall thickening. Gallbladder contains mildly opaque gallstones. No adjacent fat inflammation. No bile duct dilation. Pancreas: Unremarkable. No pancreatic ductal dilatation or surrounding inflammatory changes. Spleen: Normal in size without focal abnormality. Adrenals/Urinary Tract: No adrenal masses. Mild bilateral renal  cortical thinning. Kidneys normal in size, orientation and position with symmetric enhancement and excretion. Two right renal cysts, midpole 3 cm cyst and upper pole 1 cm cyst. No other renal masses, no stones and no hydronephrosis. Ureters are normal in course and caliber. Bladder is unremarkable. Stomach/Bowel: Normal stomach. Small bowel and colon are normal in caliber. No wall thickening. No inflammation. Mild increased colonic stool burden similar to the prior CT. Normal appendix visualized. Vascular/Lymphatic: Aortic atherosclerosis. No aneurysm. No enlarged lymph nodes. Reproductive: Uterus and bilateral adnexa are unremarkable. Other: No abdominal wall hernia or abnormality. No abdominopelvic ascites. Musculoskeletal: No fracture or acute finding. No osteoblastic or osteolytic lesions. IMPRESSION: 1. Gallbladder wall thickening with gallstones, consistent with early acute cholecystitis in the proper clinical setting. Consider further assessment with limited right upper quadrant ultrasound. 2. No other evidence of an acute abnormality within the abdomen or pelvis. 3. Mild increased colonic stool burden.  No bowel inflammation. Electronically Signed   By: Lajean Manes M.D.   On: 06/19/2019 12:51   US ABDOMEN LIMITED RUQ  Result Date: 06/19/2019 CLINICAL DATA:  Right upper quadrant pain, vomiting, hyperbilirubinemia EXAM: ULTRASOUND ABDOMEN LIMITED RIGHT UPPER QUADRANT COMPARISON:  Abdominal ultrasound 08/18/2016 FINDINGS: Gallbladder: There are several shadowing gallstones measuring up to 1.5 cm as well as sludge in the gallbladder. Borderline wall thickening measuring 0.4 cm. No pericholecystic fluid. Negative sonographic Percell Miller sign however patient is medicated. Common bile duct: Diameter: 0.5 cm, within normal limits. Liver: No focal lesion identified. Liver parenchymal echogenicity is normal to minimally increased. Portal vein is patent on color Doppler imaging with normal direction of blood flow  towards the liver. Other: None. IMPRESSION: 1. Cholelithiasis with borderline gallbladder wall thickening. No pericholecystic fluid. Negative sonographic Percell Miller sign though patient is medicated. Findings are indeterminate for acute cholecystitis. 2. Liver parenchymal echogenicity may be slightly increased as can be seen in early fatty infiltration. Electronically Signed   By: Audie Pinto M.D.   On: 06/19/2019 13:36    EKG: Independently reviewed.   Assessment/Plan Principal Problem:   Cholelithiasis with acute cholecystitis Active Problems:   CHF (congestive heart failure) (H[facility])   Hypothyroidism   CAD (coronary artery disease)   Calculus of gallbladder with acute cholecystitis   AF (paroxysmal atrial fibrillation) ([facility])    Cholelithiasis with acute cholecystitis Patient presents for evaluation of right upper quadrant pain associated with nausea and vomiting, labs revealed markedly elevated transaminase levels and elevated total bilirubin levels as well Hold Lipitor and Zetia Patient denies history of acetaminophen use CT scan of abdomen and pelvis is suggestive of acute cholecystitis Will request surgical and GI consult Keep patient n.p.o. IV antibiotic therapy with Zosyn  IV fluid hydration Pain control and antiemetics Hold anticoagulation for now   Hypothyroidism Continue Synthroid   Chronic diastolic dysfunction CHF Not in acute exacerbation Monitor closely for terms of fluid overload Continue metoprolol, lisinopril and spironolactone   Paroxysmal atrial fibrillation Patient on Eliquis as primary prophylaxis for acute CVA Will hold Eliquis for possible procedure  DVT prophylaxis: SCD Code Status: Full code Family Communication: Plan of care was discussed with patient in detail. She verbalizes understanding and agrees with the plan. Disposition Plan: Back to previous home environment Consults called: GI, Surgery    Sinclaire Artiga MD [facility]      06/19/2019, 5:14 PM

## 2019-06-19 NOTE — ED Notes (Signed)
Pt requesting something to drink. Explained that [place] would need to result and have EDP's approval before having anything to eat or drink. Pt understanding.

## 2019-06-19 NOTE — ED Notes (Signed)
Green top recollected and sent to lab ?

## 2019-06-19 NOTE — ED Notes (Signed)
Imaging staff dropped pt back off to room and told this RN pt was up to toilet. This RN entered room and reminded pt urine sample needed. Cup on counter near toilet empty. Pt had urinated into toilet having forgotten to use specimen cup. Pt states she will use the cup next time she can provide a sample.

## 2019-06-19 NOTE — ED Notes (Signed)
Attempted 22g at R lateral ac without success. Called lab. Phlebotomy will come to bedside to assist soon per Levada Dy in lab.

## 2019-06-19 NOTE — ED Provider Notes (Signed)
[facility] Emergency Department Provider Note       Time seen: ----------------------------------------- 9:23 AM on 06/19/2019 -----------------------------------------   I have reviewed the triage vital signs and the nursing notes.  HISTORY   Chief Complaint Abdominal Pain    HPI Christie Carpenter is a 74 y.o. female with a history of non-Hodgkin's lymphoma, CHF, coronary disease, depression, diabetes, hypothyroidism who presents to the ED for quadrant abdominal pain with nausea vomiting since last night.  Patient reports taking her daily.  Medication without relief.  Discomfort is 8 out of 10 in the lower abdomen.  Past Medical History:  Diagnosis Date  . Cancer ([place]) 1987   non hodgkins lymphma  . CHF (congestive heart failure) ([place])   . Coronary artery disease   . Depression   . Diabetes mellitus without complication ([place])   . Heart murmur   . Hypothyroidism   . Mitral valve problem   . Presence of permanent cardiac pacemaker   . Pulmonary hypertension ([place])   . Shortness of breath dyspnea     Patient Active Problem List   Diagnosis Date Noted  . Sepsis ([place]) 07/20/2016  . Cellulitis 01/17/2015  . Acute on chronic diastolic CHF (congestive heart failure), NYHA class 3 ([place]) 09/19/2014  . CHF (congestive heart failure) ([place]) 09/17/2014  . Severe mitral insufficiency 09/17/2014  . Hypothyroidism 09/17/2014  . Pulmonary hypertension ([place]) 09/17/2014  . CAD (coronary artery disease) 09/17/2014  . Pacemaker 09/17/2014    Past Surgical History:  Procedure Laterality Date  . PACEMAKER INSERTION      Allergies Oxycodone and Bupropion  Social History Social History   Tobacco Use  . Smoking status: Former Research scientist (life sciences)  . Smokeless tobacco: Never Used  Substance Use Topics  . Alcohol use: No  . Drug use: No    Review of Systems Constitutional: Negative for fever. Cardiovascular: Negative for chest pain. Respiratory: Negative for shortness  of breath. Gastrointestinal: Positive for abdominal pain Musculoskeletal: Negative for back pain. Skin: Negative for rash. Neurological: Negative for headaches, focal weakness or numbness.  All systems negative/normal/unremarkable except as stated in the HPI  ____________________________________________   PHYSICAL EXAM:  VITAL SIGNS: ED Triage Vitals  Enc Vitals Group     BP --      Pulse --      Resp --      Temp --      Temp src --      SpO2 --      Weight 06/19/19 0917 180 lb (81.6 kg)     Height 06/19/19 0917 5\' 5"  (1.651 m)     Head Circumference --      Peak Flow --      Pain Score 06/19/19 0916 8     Pain Loc --      Pain Edu? --      Excl. in [place]? --     Constitutional: Alert and oriented. Well appearing and in no distress. Eyes: Conjunctivae are normal. Normal extraocular movements. ENT      Head: Normocephalic and atraumatic.      Nose: No congestion/rhinnorhea.      Mouth/Throat: Mucous membranes are moist.      Neck: No stridor. Cardiovascular: Normal rate, regular rhythm. No murmurs, rubs, or gallops. Respiratory: Normal respiratory effort without tachypnea nor retractions. Breath sounds are clear and equal bilaterally. No wheezes/rales/rhonchi. Gastrointestinal: Left lower quadrant tenderness, no rebound or guarding.  Normal bowel sounds. Musculoskeletal: Nontender with normal range of motion in extremities.  No lower extremity tenderness nor edema. Neurologic:  Normal speech and language. No gross focal neurologic deficits are appreciated.  Skin:  Skin is warm, dry and intact. No rash noted. Psychiatric: Mood and affect are normal. Speech and behavior are normal.  ____________________________________________  ED COURSE:  As part of my medical decision making, I reviewed the following data within the [place] History obtained from family if available, nursing notes, old chart and ekg, as well as notes from prior ED visits. Patient  presented for abdominal pain, we will assess with labs and imaging as indicated at this time.   Procedures  Christie Carpenter was evaluated in Emergency Department on 06/19/2019 for the symptoms described in the history of present illness. She was evaluated in the context of the global COVID-19 pandemic, which necessitated consideration that the patient might be at risk for infection with the SARS-CoV-2 virus that causes COVID-19. Institutional protocols and algorithms that pertain to the evaluation of patients at risk for COVID-19 are in a state of rapid change based on information released by regulatory bodies including the CDC and federal and state organizations. These policies and algorithms were followed during the patient's care in the ED.  ____________________________________________   LABS (pertinent positives/negatives)  Labs Reviewed  CBC WITH DIFFERENTIAL/PLATELET - Abnormal; Notable for the following components:      Result Value   MCV 101.2 (*)    All other components within normal limits  COMPREHENSIVE METABOLIC PANEL - Abnormal; Notable for the following components:   Glucose, Bld 120 (*)    AST 1,625 (*)    ALT 1,057 (*)    Total Bilirubin 3.1 (*)    All other components within normal limits  LIPASE, BLOOD - Abnormal; Notable for the following components:   Lipase 167 (*)    All other components within normal limits  URINALYSIS, COMPLETE (UACMP) WITH MICROSCOPIC    RADIOLOGY Images were viewed by me  CT of the abdomen pelvis with contrast IMPRESSION:  1. Gallbladder wall thickening with gallstones, consistent with  early acute cholecystitis in the proper clinical setting. Consider  further assessment with limited right upper quadrant ultrasound.  2. No other evidence of an acute abnormality within the abdomen or  pelvis.  3. Mild increased colonic stool burden. No bowel inflammation.  IMPRESSION:  1. Cholelithiasis with borderline gallbladder wall thickening. No   pericholecystic fluid. Negative sonographic Percell Miller sign though  patient is medicated. Findings are indeterminate for acute  cholecystitis.   2. Liver parenchymal echogenicity may be slightly increased as can  be seen in early fatty infiltration.  ____________________________________________   DIFFERENTIAL DIAGNOSIS   Constipation, diverticulitis, renal colic, UTI, pyelonephritis, muscle strain  FINAL ASSESSMENT AND PLAN  Cholecystitis  Plan: The patient had presented for left lower quadrant pain. Patient's labs indicate significant transaminitis and elevated lipase likely indicating choledocholithiasis.  Bilirubin was 3.1.  She has not been febrile or had leukocytosis. Patient's imaging on CT revealed gallstones and thickened gallbladder.  Findings reveal cholecystitis.  I have ordered IV Zosyn for her and will consult general surgery for admission.   Laurence Aly, MD    Note: This note was generated in part or whole with voice recognition software. Voice recognition is usually quite accurate but there are transcription errors that can and very often do occur. I apologize for any typographical errors that were not detected and corrected.     Earleen Newport, MD 06/19/19 1356

## 2019-06-19 NOTE — ED Notes (Signed)
Levada Dy in lab confirms pt still needs 6 SST tubes, red/lav/blue tubes. Will send soon.

## 2019-06-19 NOTE — ED Triage Notes (Signed)
Pt in via POV, brought over from [place], reports LLQ abdominal pain w/ N/V since last night.  Reports taking her daily reflux medication without relief.  NAD noted at this time.

## 2019-06-19 NOTE — ED Notes (Signed)
Pt given remote to TV.

## 2019-06-19 NOTE — ED Notes (Signed)
Pt O2 sat dropping while dozing off. Patient placed on 2L [place] with increase to 98%. Patient reports continued control of pain and nausea.

## 2019-06-20 ENCOUNTER — Inpatient Hospital Stay
Admit: 2019-06-20 | Discharge: 2019-06-20 | Disposition: A | Discharge status: Home / Self Care | Payer: Medicare Other | Attending: Internal Medicine | Admitting: Internal Medicine

## 2019-06-20 ENCOUNTER — Encounter: Payer: Self-pay | Admitting: Internal Medicine

## 2019-06-20 DIAGNOSIS — I480 Paroxysmal atrial fibrillation: Secondary | ICD-10-CM

## 2019-06-20 DIAGNOSIS — I2510 Atherosclerotic heart disease of native coronary artery without angina pectoris: Secondary | ICD-10-CM

## 2019-06-20 DIAGNOSIS — E039 Hypothyroidism, unspecified: Secondary | ICD-10-CM

## 2019-06-20 DIAGNOSIS — R945 Abnormal results of liver function studies: Secondary | ICD-10-CM

## 2019-06-20 LAB — ECHOCARDIOGRAM COMPLETE
Height: 65 in
Weight: 2880 oz

## 2019-06-20 LAB — COMPREHENSIVE METABOLIC PANEL
ALT: 914 U/L — ABNORMAL HIGH (ref 0–44)
AST: 847 U/L — ABNORMAL HIGH (ref 15–41)
Albumin: 3.7 g/dL (ref 3.5–5.0)
Alkaline Phosphatase: 159 U/L — ABNORMAL HIGH (ref 38–126)
Anion gap: 8 (ref 5–15)
BUN: 11 mg/dL (ref 8–23)
CO2: 28 mmol/L (ref 22–32)
Calcium: 9.1 mg/dL (ref 8.9–10.3)
Chloride: 105 mmol/L (ref 98–111)
Creatinine, Ser: 0.73 mg/dL (ref 0.44–1.00)
GFR calc Af Amer: >60 mL/min (ref >60)
GFR calc non Af Amer: >60 mL/min (ref >60)
Glucose, Bld: 116 mg/dL — ABNORMAL HIGH (ref 70–99)
Potassium: 4 mmol/L (ref 3.5–5.1)
Sodium: 141 mmol/L (ref 135–145)
Total Bilirubin: 4.2 mg/dL — ABNORMAL HIGH (ref 0.3–1.2)
Total Protein: 6.7 g/dL (ref 6.5–8.1)

## 2019-06-20 LAB — HSV DNA BY PCR (REFERENCE LAB)
HSV 1 DNA: NEGATIVE
HSV 2 DNA: NEGATIVE

## 2019-06-20 LAB — HEPATITIS B DNA, ULTRAQUANTITATIVE, PCR
HBV DNA SERPL PCR-ACNC: NOT DETECTED IU/mL
HBV DNA SERPL PCR-LOG IU: UNDETERMINED log10 IU/mL

## 2019-06-20 LAB — PROTIME-INR
INR: 1 (ref 0.8–1.2)
Prothrombin Time: 13.1 seconds (ref 11.4–15.2)

## 2019-06-20 LAB — HEPATITIS B SURFACE ANTIBODY, QUANTITATIVE: Hep B S AB Quant (Post): <3.1 m[IU]/mL — ABNORMAL LOW (ref >9.9)

## 2019-06-20 LAB — HEPATITIS B E ANTIGEN: Hep B E Ag: NEGATIVE

## 2019-06-20 LAB — EBV AB TO VIRAL CAPSID AG PNL, IGG+IGM
EBV VCA IgG: 125 U/mL — ABNORMAL HIGH (ref 0.0–17.9)
EBV VCA IgM: <36 U/mL (ref 0.0–35.9)

## 2019-06-20 MED ORDER — ACETAMINOPHEN 325 MG PO TABS
650.0000 mg | ORAL_TABLET | Freq: Four times a day (QID) | ORAL | Status: DC | PRN
  Administered 2019-06-20 – 2019-06-21 (×2): 650 mg via ORAL
  Filled 2019-06-20 (×2): qty 2

## 2019-06-20 MED ORDER — MORPHINE SULFATE (PF) 2 MG/ML IV SOLN
2.0000 mg | Freq: Once | INTRAVENOUS | Status: DC
  Filled 2019-06-20: qty 1

## 2019-06-20 NOTE — Progress Notes (Addendum)
PROGRESS NOTE  Christie Carpenter [id number] DOB: February 10, 1946 DOA: 06/19/2019 PCP: Ellamae Sia, MD  Brief History    Christie Carpenter is a 74 y.o. female with medical history significant for non-Hodgkin's lymphoma, CHF, coronary artery disease, diabetes mellitus, S/p mitral valve repair, hypothyroidism, history of atrial fibrillation status post pacemaker/AICD insertion who presents to the emergency room for evaluation of abdominal pain which started in her LLQ and radiated to the right upper quadrant. Pain started several hours after a meal. She rated her pain an 8 x 10 in intensity at its worst.  Pain has no relieving or aggravating factors. Patient said she has had episodes of pain like this in the past but not so intense. Abdominal pain has been associated with nausea, vomiting and diaphoresis. She denies having any changes in her bowel habits. She denies having any chest pain, shortness of breath, palpitations, fever, chills, tinnitus or lightheadedness.  Labs revealed elevated liver enzymes in the 1000/s, with elevated total bilirubin and normal alkaline phosphatase level.  CT scan of the abdomen and pelvis showed gallbladder wall thickening and cholelithiasis consistent with early cholecystitis.  Gallbladder ultrasound showed cholelithiasis  The emergency room for evaluation of right upper quadrant quadrant pain, labs indicated significant transaminitis and elevated lipase suspicious for choledocholithiasis.  Bilirubin was elevated at 3.1 and CT scan was suggestive of cholelithiasis  [facility] have been consulted to admit the patient for further evaluation and treatment. General surgery, cardiology, and gastroenterology have been consulted. Surgery will have to wait 48 hours as the patient was on Eliquis upon presentation. Depending on the results of IOC, GI will then take the patient for ERCP. Cardiology has been consulted and echocardiogram ordered for pre-op clearance.  Consultants   . Gastroenterology . General surgery . Cardiology  Procedures  . None  Antibiotics   Anti-infectives (From admission, onward)   Start     Dose/Rate Route Frequency Ordered Stop   06/19/19 2200  piperacillin-tazobactam (ZOSYN) IVPB 3.375 g     3.375 g 12.5 mL/hr over 240 Minutes Intravenous Every 8 hours 06/19/19 1622     06/19/19 1400  piperacillin-tazobactam (ZOSYN) IVPB 3.375 g     3.375 g 100 mL/hr over 30 Minutes Intravenous  Once 06/19/19 1348 06/19/19 1427    .   Subjective  The patient is resting comfortably. She continues to complain of nausea and abdominal pain.  Objective   Vitals:  Vitals:   06/20/19 0836 06/20/19 1143  BP: (!) 158/69 131/66  Pulse: 84 67  Resp: 20 16  Temp:  97.7 F (36.5 C)  SpO2: 94% 100%  Exam:  Constitutional:  . The patient is awake, alert, and oriented x 3. No acute distress. Respiratory:  . No increased work of breathing. . No wheezes, rales, or rhonchi . No tactile fremitus Cardiovascular:  . Regular rate and rhythm . No murmurs, ectopy, or gallups. . No lateral PMI. No thrills. Abdomen:  . Abdomen is soft, non-tender, non-distended . No hernias, masses, or organomegaly . Normoactive bowel sounds.  Musculoskeletal:  . No cyanosis, clubbing, or edema Skin:  . No rashes, lesions, ulcers . palpation of skin: no induration or nodules Neurologic:  . CN 2-12 intact . Sensation all 4 extremities intact Psychiatric:  . Mental status o Mood, affect appropriate o Orientation to person, place, time  . judgment and insight appear intact   I have personally reviewed the following:   Today's Data  . Vitals, CMP, CBC  Imaging  . CT abdomen  and pelvis  Cardiology Data  . EKG . Echocardiogram  Scheduled Meds: . levothyroxine  112 mcg Oral q morning - 10a  . lisinopril  2.5 mg Oral Daily  . metoprolol succinate  25 mg Oral Daily  .  morphine injection  2 mg Intravenous Once  . multivitamin with minerals  1 tablet  Oral Daily  . spironolactone  12.5 mg Oral Daily   Continuous Infusions: . sodium chloride 50 mL/hr at 06/20/19 1245  . piperacillin-tazobactam (ZOSYN)  IV 3.375 g (06/20/19 1247)    Principal Problem:   Cholelithiasis with acute cholecystitis Active Problems:   CHF (congestive heart failure) ([facility])   Hypothyroidism   CAD (coronary artery disease)   [place] of gallbladder with acute cholecystitis   AF (paroxysmal atrial fibrillation) ([facility])   LOS: 1 day   A & P  Cholelithiasis with acute cholecystitis: Patient presented for evaluation of right upper quadrant pain associated with nausea and vomiting, labs revealed markedly elevated transaminase levels and elevated total bilirubin levels as well. CT scan of abdomen and pelvis is suggestive of acute cholecystitis. General surgery has been consulted. Plan is for surgery after patient has been off of Eliquis for 48 hours. IOC to be performed. Results of IOC will guide GI's determination on whether or not the patient will need ERCP. The patient is on a clear liquid diet. Lipitor and Zetia have been held. She is receiving IV Zosyn for antibiotic coverage. Blood cultures x 2 have been ordered. She is receiving pain control and antiemetics. Anticoagulation has been held.  Hypothyroidism: Continue Synthroid  CAD/Chronic diastolic dysfunction CHF: Echocardiogram performed today demonstrated EF of [id number] with no diastolic dysfunction or RV dysfunction. Monitor the patient for volume status. Continue metoprolol, lisinopril and spironolactone. Cardiology has been consulted for pre-op clearance from a cardiac standpoint. I appreciate their assistance.  Paroxysmal atrial fibrillation: Pt has Bi-v Pacer. Cardiology has been consulted to clear the patient for surgery from a cardiology standpoint. Eliquis which the patient takes for stroke prophylaxis has been held. She will need to be off of Eliquis for 48 hours prior to surgery.  I have seen and examined  this patient myself. I have spent 35 minutes in her evaluation and care.  DVT prophylaxis: SCD Code Status: Full code Family Communication: Plan of care was discussed with patient in detail. She verbalizes understanding and agrees with the plan. Disposition Plan: From home. To home after surgery/ERCP/Clinical improvement.  Justine Dines, DO [facility] Direct contact: see [email or web address]  7PM-7AM contact night coverage as above 06/20/2019, 1:56 PM  LOS: 1 day

## 2019-06-20 NOTE — Consult Note (Signed)
Christie Carpenter , MD [address] [place], [place], [place], [id number] [place], [place], [place], [address] Phone: [phone]  Fax: [phone]  Consultation  Referring Provider:     Dr. Jimmye Norman  primary Care Physician:  Ellamae Sia, MD Primary Gastroenterologist: None         Reason for Consultation:     Abnormal LFTs  Date of Admission:  06/19/2019 Date of Consultation:  06/20/2019         HPI:   Christie Carpenter is a 74 y.o. female presented to the emergency room yesterday with abdominal pain.  History of non-Hodgkin's lymphoma, CHF, coronary artery disease, AICD.   In the emergency room underwent a CT scan of the abdomen that demonstrated gallbladder wall thickening with gallstones consistent with early acute cholecystitis.  No evidence of any other acute abnormality in the abdomen.  Increased stool burden.  This was followed up on right upper quadrant ultrasound showed a common bile duct of 0.5 cm.  Cholelithiasis with borderline gallbladder wall thickening no pericholecystic fluid.  On admission found to have a whi her transaminases have significantly improved in terms of ALT and AST but total bilirubin has risen from 3.1-4.2.  She cannot undergo an MRCP as she has a pacemaker.  Te cell count of 13.7, CMP that demonstrated an AST of thousand 600 and an ALT of thousand with a total bilirubin of 3.1 and a normal alkaline phosphatase.  Creatinine was 0.7.  Lipase was elevated to 167.  INR was 0.9 urine analysis showed moderate hemoglobin in the urine.  Hepatitis B core total antibody, hepatitis B core antibody IgM, hepatitis a antibody IgM, hepatitis B E antigen, surface antigen were nonreactive.  Surface antibody was positive indicating prior immunization.  Tylenol levels were within normal limits.  CK was not elevated and she tested negative for Covid.  This morningINR still 1.0.  She has been seen and evaluated byGeneral surgery who is planning for a cholecystectomy.   She has been commenced on Zosyn.  She states that she has a family history of gallbladder issues in the mother and sister.  She recalls that she has had pain postprandial in the past over her entire abdomen lasting for about 20 to 30 minutes.  This episode she was doing fine till Sunday evening when she developed all of a sudden pain all over her abdomen associated with nausea and vomiting.  Happened after eating a meal.  Persisted hence she came into the hospital.  Denies any excess Tylenol consumption or any mushroom usage or new medication or herbal supplements.  Presently has no pain no vomiting but still has some nausea.  Denies any fevers presently she did feel lightheaded at some point when she had all the symptoms at home.  Past Medical History:  Diagnosis Date  . Cancer ([place]) 1987   non hodgkins lymphoma  . CHF (congestive heart failure) ([place])   . Coronary artery disease   . Depression   . Diabetes mellitus without complication ([place])   . Heart murmur   . Hypothyroidism   . Mitral valve problem   . Presence of permanent cardiac pacemaker   . Pulmonary hypertension ([place])   . Shortness of breath dyspnea     Past Surgical History:  Procedure Laterality Date  . PACEMAKER INSERTION      Prior to Admission medications   Medication Sig Start Date End Date Taking? Authorizing Provider  apixaban (ELIQUIS) 5 MG TABS tablet Take 5  mg by mouth 2 (two) times daily.   Yes [provider]  atorvastatin (LIPITOR) 80 MG tablet Take 1 tablet by mouth daily. 11/26/14 06/19/19 Yes [provider]  ezetimibe (ZETIA) 10 MG tablet Take 10 mg by mouth daily. 12/20/18 12/20/19 Yes [provider]  furosemide (LASIX) 20 MG tablet Take 20 mg by mouth daily as needed for fluid.    Yes [provider]  levothyroxine (SYNTHROID) 112 MCG tablet Take 112 mcg by mouth every morning.  12/01/14 06/19/19 Yes [provider]  lisinopril (PRINIVIL,ZESTRIL) 2.5 MG tablet Take 1  tablet by mouth daily. 12/01/14 06/19/19 Yes [provider]  metoprolol succinate (TOPROL-XL) 25 MG 24 hr tablet Take 1 tablet by mouth daily. 12/25/14 06/19/19 Yes [provider]  Multiple Vitamin (MULTIVITAMIN WITH MINERALS) TABS tablet Take 1 tablet by mouth daily.   Yes [provider]  spironolactone (ALDACTONE) 25 MG tablet Take 0.5 tablets by mouth daily. 12/01/14 06/19/19 Yes [provider]    Family History  Problem Relation Age of Onset  . Leukemia Other      Social History   Tobacco Use  . Smoking status: Former Research scientist (life sciences)  . Smokeless tobacco: Never Used  Substance Use Topics  . Alcohol use: No  . Drug use: No    Allergies as of 06/19/2019 - Review Complete 06/19/2019  Allergen Reaction Noted  . Oxycodone Shortness Of Breath 07/20/2016  . Bupropion Itching 06/19/2019    Review of Systems:    All systems reviewed and negative except where noted in HPI.   Physical Exam:  Vital signs in last 24 hours: Temp:  [97.7 F (36.5 C)-98 F (36.7 C)] 97.7 F (36.5 C) (03/09 0444) Pulse Rate:  [69-87] 84 (03/09 0836) Resp:  [17-20] 20 (03/09 0836) BP: (103-178)/(51-91) 158/69 (03/09 0836) SpO2:  [94 %-100 %] 94 % (03/09 0836) Weight:  [81.6 kg] 81.6 kg (03/08 0917) Last BM Date: 06/19/19 General:   Pleasant, cooperative in NAD Head:  Normocephalic and atraumatic. Eyes:   No icterus.   Conjunctiva pink. PERRLA. Lungs: Respirations even and unlabored. Lungs clear to auscultation bilaterally.   No wheezes, crackles, or rhonchi.  Heart:  Regular rate and rhythm;  Without murmur, clicks, rubs or gallops Abdomen:  Soft, nondistended, nontender. Normal bowel sounds. No appreciable masses or hepatomegaly.  No rebound or guarding.  Neurologic:  Alert and oriented x3;  grossly normal neurologically. Psych:  Alert and cooperative. Normal affect.  LAB RESULTS: Recent Labs    06/19/19 0955  WBC 6.9  HGB 13.7  HCT 42.1  PLT 217   BMET Recent  Labs    06/19/19 1131 06/20/19 0455  NA 137 141  K 4.2 4.0  CL 101 105  CO2 28 28  GLUCOSE 120* 116*  BUN 17 11  CREATININE 0.70 0.73  CALCIUM 9.4 9.1   LFT Recent Labs    06/20/19 0455  PROT 6.7  ALBUMIN 3.7  AST 847*  ALT 914*  ALKPHOS 159*  BILITOT 4.2*   PT/INR Recent Labs    06/19/19 1547 06/20/19 0455  LABPROT 12.2 13.1  INR 0.9 1.0    STUDIES: CT ABDOMEN PELVIS W CONTRAST  Result Date: 06/19/2019 CLINICAL DATA:  Abdominal pain beginning last night with nausea. History of non-Hodgkin's lymphoma. EXAM: CT ABDOMEN AND PELVIS WITH CONTRAST TECHNIQUE: Multidetector CT imaging of the abdomen and pelvis was performed using the standard protocol following bolus administration of intravenous contrast. CONTRAST:  176mL OMNIPAQUE IOHEXOL 300 MG/ML  SOLN COMPARISON:  07/20/2016 FINDINGS: Lower chest: No acute abnormality. Hepatobiliary: Normal liver. Gallbladder is distended with mild wall thickening. Gallbladder contains mildly opaque gallstones. No adjacent fat inflammation. No bile duct dilation. Pancreas: Unremarkable. No pancreatic ductal dilatation or surrounding inflammatory changes. Spleen: Normal in size without focal abnormality. Adrenals/Urinary Tract: No adrenal masses. Mild bilateral renal cortical thinning. Kidneys normal in size, orientation and position with symmetric enhancement and excretion. Two right renal cysts, midpole 3 cm cyst and upper pole 1 cm cyst. No other renal masses, no stones and no hydronephrosis. Ureters are normal in course and caliber. Bladder is unremarkable. Stomach/Bowel: Normal stomach. Small bowel and colon are normal in caliber. No wall thickening. No inflammation. Mild increased colonic stool burden similar to the prior CT. Normal appendix visualized. Vascular/Lymphatic: Aortic atherosclerosis. No aneurysm. No enlarged lymph nodes. Reproductive: Uterus and bilateral adnexa are unremarkable. Other: No abdominal wall hernia or abnormality. No  abdominopelvic ascites. Musculoskeletal: No fracture or acute finding. No osteoblastic or osteolytic lesions. IMPRESSION: 1. Gallbladder wall thickening with gallstones, consistent with early acute cholecystitis in the proper clinical setting. Consider further assessment with limited right upper quadrant ultrasound. 2. No other evidence of an acute abnormality within the abdomen or pelvis. 3. Mild increased colonic stool burden.  No bowel inflammation. Electronically Signed   By: Lajean Manes M.D.   On: 06/19/2019 12:51   US ABDOMEN LIMITED RUQ  Result Date: 06/19/2019 CLINICAL DATA:  Right upper quadrant pain, vomiting, hyperbilirubinemia EXAM: ULTRASOUND ABDOMEN LIMITED RIGHT UPPER QUADRANT COMPARISON:  Abdominal ultrasound 08/18/2016 FINDINGS: Gallbladder: There are several shadowing gallstones measuring up to 1.5 cm as well as sludge in the gallbladder. Borderline wall thickening measuring 0.4 cm. No pericholecystic fluid. Negative sonographic Percell Miller sign however patient is medicated. Common bile duct: Diameter: 0.5 cm, within normal limits. Liver: No focal lesion identified. Liver parenchymal echogenicity is normal to minimally increased. Portal vein is patent on color Doppler imaging with normal direction of blood flow towards the liver. Other: None. IMPRESSION: 1. Cholelithiasis with borderline gallbladder wall thickening. No pericholecystic fluid. Negative sonographic Percell Miller sign though patient is medicated. Findings are indeterminate for acute cholecystitis. 2. Liver parenchymal echogenicity may be slightly increased as can be seen in early fatty infiltration. Electronically Signed   By: Audie Pinto M.D.   On: 06/19/2019 13:36      Impression / Plan:   Christie Carpenter is a 74 y.o. y/o female whom I have been consulted for abnormal LFTs.  She has history of CHF and AICD.  Presented with abdominal pain and imaging has features suggestive of acute cholecystitis.  Common bile duct is not dilated.   On admission total bilirubin of 3.1.  Intention was to rule out stones in the common bile duct but She cannot undergo an MRCP as she has a pacemaker.  Hence not proceeded with the same.  Seen by general surgery and plan is to proceed with cholecystectomy.  There is a plan to perform an IO cholangiogram and if it shows stones further ERCP will be required.  In terms of her transaminases when they are in the range of over thousand it is usually suggestive of ischemia versus viral hepatitis versus toxins or a combination..  The fact that the numbers are improving significantly overnight which is very suggestive of ischemia.  Usually the course is benign and they will return back to normal within a few days.  The bilirubin may be lagging behind and hence may have gone up slightly today.  White cell count is normal and she is covered with antibiotics.  I will follow the patient and if she has a stones in her I/o cholangiogram we will arrange for an ERCP. so far hepatitis and viral serologies are negative few other tests are pending which will follow up tomorrow.  Thank you for involving me in the care of this patient.      LOS: 1 day   Christie Bellows, MD  06/20/2019, 9:01 AM

## 2019-06-20 NOTE — Progress Notes (Signed)
*  PRELIMINARY RESULTS* Echocardiogram 2D Echocardiogram has been performed.  Sherrie Sport 06/20/2019, 11:26 AM

## 2019-06-20 NOTE — Consult Note (Signed)
CARDIOLOGY CONSULT NOTE               Patient ID: Christie Carpenter MRN: [id number] DOB/AGE: [birth year] 75 y.o.  Admit date: 06/19/2019 Referring Physician Karie Kirks, DO Primary Physician Ellamae Sia, MD  Primary Cardiologist Piccini, MD Reason for Consultation Pre-op cardiovascular evaluation  HPI: 74 year old female referred for evaluation of preoperative cardiovascular evaluation prior to IO cholangiogram followed by potential ERCP. The patient has a history of pulmonary hypertension, non-rheumatic mitral insufficiency, status post bioprosthetic mitral valve repair in [id number], complicated by new onset systolic heart failure with LVEF 25%, complete heart block status post pacemaker, status post upgrade to [facility]. Jude CRT-D in 2017, seizure immediately following valve repair, atrial fibrillation on Eliquis (last dose on PM of 06/19/2019), hypertension, normal coronary anatomy per cardiac catheterization in 2016.  The patient presented to [facility] ER on 06/19/2019 for right upper quadrant abdominal pain. Labs were notable for elevated transaminases in 1000s. Gallbladder ultrasound revealed cholelithiasis. The patient denies chest pain. She has chronic exertional shortness of breath induced by performing light housework. She states that her breathing has improved since she has been taking walks more regularly. She takes 15 minute walks 3 days a week. She denies unprovoked palpitations or heart racing. She has occasional lower extremity edema with prolonged standing or travel. She denies recent presyncope or syncope. She denies a history of MI or CKD, and is not on medications for diabetes. ECG today reveals atrial sensed ventricular paced rhythm at a rate of 71 bpm. Most recent ICD interrogation was 02/26/2019 revealed no episodes of VT/VF with stable CRT-D battery, with BiV pacing 98%. 2D echocardiogram in 05/06/2018 revealed mildly reduced left ventricular function with LVEF 50% (calculated EF 42%), with  septal and apical hypokinesis, prosthetic mitral valve, with mild mitral and tricuspid regurgitation, mild aortic stenosis.  Review of systems complete and found to be negative unless listed above     Past Medical History:  Diagnosis Date  . Cancer ([place]) 1987   non hodgkins lymphoma  . CHF (congestive heart failure) ([place])   . Coronary artery disease   . Depression   . Diabetes mellitus without complication ([place])   . Heart murmur   . Hypothyroidism   . Mitral valve problem   . Presence of permanent cardiac pacemaker   . Pulmonary hypertension ([place])   . Shortness of breath dyspnea     Past Surgical History:  Procedure Laterality Date  . PACEMAKER INSERTION      Medications Prior to Admission  Medication Sig Dispense Refill Last Dose  . apixaban (ELIQUIS) 5 MG TABS tablet Take 5 mg by mouth 2 (two) times daily.   Past Week at Unknown time  . atorvastatin (LIPITOR) 80 MG tablet Take 1 tablet by mouth daily.   Past Week at Unknown time  . ezetimibe (ZETIA) 10 MG tablet Take 10 mg by mouth daily.   Past Week at Unknown time  . furosemide (LASIX) 20 MG tablet Take 20 mg by mouth daily as needed for fluid.    prn at prn  . levothyroxine (SYNTHROID) 112 MCG tablet Take 112 mcg by mouth every morning.    Past Week at Unknown time  . lisinopril (PRINIVIL,ZESTRIL) 2.5 MG tablet Take 1 tablet by mouth daily.   Past Week at Unknown time  . metoprolol succinate (TOPROL-XL) 25 MG 24 hr tablet Take 1 tablet by mouth daily.   Past Week at Unknown time  . Multiple Vitamin (MULTIVITAMIN WITH MINERALS) TABS  tablet Take 1 tablet by mouth daily.   Past Week at Unknown time  . spironolactone (ALDACTONE) 25 MG tablet Take 0.5 tablets by mouth daily.   Past Week at Unknown time   Social History   Socioeconomic History  . Marital status: Widowed    Spouse name: Not on file  . Number of children: Not on file  . Years of education: Not on file  . Highest education level: Not on file  Occupational  History  . Not on file  Tobacco Use  . Smoking status: Former Research scientist (life sciences)  . Smokeless tobacco: Never Used  Substance and Sexual Activity  . Alcohol use: No  . Drug use: No  . Sexual activity: Not on file  Other Topics Concern  . Not on file  Social History Narrative  . Not on file   Social Determinants of Health   Financial Resource Strain:   . Difficulty of Paying Living Expenses: Not on file  Food Insecurity:   . Worried About Charity fundraiser in the Last Year: Not on file  . Ran Out of Food in the Last Year: Not on file  Transportation Needs:   . Lack of Transportation (Medical): Not on file  . Lack of Transportation (Non-Medical): Not on file  Physical Activity:   . Days of Exercise per Week: Not on file  . Minutes of Exercise per Session: Not on file  Stress:   . Feeling of Stress : Not on file  Social Connections:   . Frequency of Communication with Friends and Family: Not on file  . Frequency of Social Gatherings with Friends and Family: Not on file  . Attends Religious Services: Not on file  . Active Member of Clubs or Organizations: Not on file  . Attends Archivist Meetings: Not on file  . Marital Status: Not on file  Intimate Partner Violence:   . Fear of Current or Ex-Partner: Not on file  . Emotionally Abused: Not on file  . Physically Abused: Not on file  . Sexually Abused: Not on file    Family History  Problem Relation Age of Onset  . Leukemia Other       Review of systems complete and found to be negative unless listed above      PHYSICAL EXAM  General: Well developed, well nourished, in no acute distress, sitting up in recliner, appears comfortable. HEENT:  Normocephalic and atramatic Neck:  No JVD.  Lungs: Clear bilaterally to auscultation and percussion. Heart: HRRR . Normal S1 and S2 without gallops or murmurs.  Abdomen: nondistended Msk:  Back normal, gait not assessed. No obvious deformities. Extremities: No clubbing,  cyanosis or edema.   Neuro: Alert and oriented X 3. Psych:  Good affect, responds appropriately  Labs:   Lab Results  Component Value Date   WBC 6.9 06/19/2019   HGB 13.7 06/19/2019   HCT 42.1 06/19/2019   MCV 101.2 (H) 06/19/2019   PLT 217 06/19/2019    Recent Labs  Lab 06/20/19 0455  NA 141  K 4.0  CL 105  CO2 28  BUN 11  CREATININE 0.73  CALCIUM 9.1  PROT 6.7  BILITOT 4.2*  ALKPHOS 159*  ALT 914*  AST 847*  GLUCOSE 116*   Lab Results  Component Value Date   CKTOTAL 60 06/19/2019   CKMB 1.4 02/08/2012   TROPONINI 0.04 (H) 09/17/2014    Lab Results  Component Value Date   CHOL 185 10/20/2011   Lab Results  Component Value Date   HDL 43 10/20/2011   Lab Results  Component Value Date   LDLCALC 104 (H) 10/20/2011   Lab Results  Component Value Date   TRIG 192 10/20/2011   No results found for: CHOLHDL No results found for: LDLDIRECT    Radiology: CT ABDOMEN PELVIS W CONTRAST  Result Date: 06/19/2019 CLINICAL DATA:  Abdominal pain beginning last night with nausea. History of non-Hodgkin's lymphoma. EXAM: CT ABDOMEN AND PELVIS WITH CONTRAST TECHNIQUE: Multidetector CT imaging of the abdomen and pelvis was performed using the standard protocol following bolus administration of intravenous contrast. CONTRAST:  150mL OMNIPAQUE IOHEXOL 300 MG/ML  SOLN COMPARISON:  07/20/2016 FINDINGS: Lower chest: No acute abnormality. Hepatobiliary: Normal liver. Gallbladder is distended with mild wall thickening. Gallbladder contains mildly opaque gallstones. No adjacent fat inflammation. No bile duct dilation. Pancreas: Unremarkable. No pancreatic ductal dilatation or surrounding inflammatory changes. Spleen: Normal in size without focal abnormality. Adrenals/Urinary Tract: No adrenal masses. Mild bilateral renal cortical thinning. Kidneys normal in size, orientation and position with symmetric enhancement and excretion. Two right renal cysts, midpole 3 cm cyst and upper pole 1 cm  cyst. No other renal masses, no stones and no hydronephrosis. Ureters are normal in course and caliber. Bladder is unremarkable. Stomach/Bowel: Normal stomach. Small bowel and colon are normal in caliber. No wall thickening. No inflammation. Mild increased colonic stool burden similar to the prior CT. Normal appendix visualized. Vascular/Lymphatic: Aortic atherosclerosis. No aneurysm. No enlarged lymph nodes. Reproductive: Uterus and bilateral adnexa are unremarkable. Other: No abdominal wall hernia or abnormality. No abdominopelvic ascites. Musculoskeletal: No fracture or acute finding. No osteoblastic or osteolytic lesions. IMPRESSION: 1. Gallbladder wall thickening with gallstones, consistent with early acute cholecystitis in the proper clinical setting. Consider further assessment with limited right upper quadrant ultrasound. 2. No other evidence of an acute abnormality within the abdomen or pelvis. 3. Mild increased colonic stool burden.  No bowel inflammation. Electronically Signed   By: Lajean Manes M.D.   On: 06/19/2019 12:51   US ABDOMEN LIMITED RUQ  Result Date: 06/19/2019 CLINICAL DATA:  Right upper quadrant pain, vomiting, hyperbilirubinemia EXAM: ULTRASOUND ABDOMEN LIMITED RIGHT UPPER QUADRANT COMPARISON:  Abdominal ultrasound 08/18/2016 FINDINGS: Gallbladder: There are several shadowing gallstones measuring up to 1.5 cm as well as sludge in the gallbladder. Borderline wall thickening measuring 0.4 cm. No pericholecystic fluid. Negative sonographic Percell Miller sign however patient is medicated. Common bile duct: Diameter: 0.5 cm, within normal limits. Liver: No focal lesion identified. Liver parenchymal echogenicity is normal to minimally increased. Portal vein is patent on color Doppler imaging with normal direction of blood flow towards the liver. Other: None. IMPRESSION: 1. Cholelithiasis with borderline gallbladder wall thickening. No pericholecystic fluid. Negative sonographic Percell Miller sign though  patient is medicated. Findings are indeterminate for acute cholecystitis. 2. Liver parenchymal echogenicity may be slightly increased as can be seen in early fatty infiltration. Electronically Signed   By: Audie Pinto M.D.   On: 06/19/2019 13:36    EKG: atrial sensed ventricular paced rhythm, rate 71 bpm  ASSESSMENT AND PLAN:  1. Preoperative cardiovascular evaluation for anticipation of surgical management of cholelithiasis. IO cholangiogram with potential ERCP is anticipated. The patient has been medically optimized with CRT-D, beta blocker, ACEi, Eliquis, statin, spironolactone, and has been minimally symptomatic from cardiovascular perspective.  2. Paroxysmal atrial fibrillation, on Eliquis for stroke prevention (last dose PM of 06/19/2019), and metoprolol for rate control. Currently in sinus rhythm. 3. HFrEF with LVEF 30%, status post AICD with history  of CHB, status post upgrade to CRT-D. Most recent 2D echocardiogram in 04/2018 revealed mildly reduced LV function with calculated LVEF 50%. 4. Presence of BiV ICD. NOT MRI compatible. Most recent interrogation in 02/2019 revealed normal battery with 98% BiV pacing with no episodes of VF/VT. She was unable to upload most recently scheduled interrogation last month due to lack of cell service. 5. Mitral regurgitation status post prosthetic mitral valve repair in 2016 6. History of stroke in 2016 following mitral valve repair 7. Hypertension, currently on lisinopril, spironolactone, and metoprolol. Mildly elevated this morning.   Plan: 1. Proceed with ERCP if deemed medically necessary, pending reprogramming of BiV ICD 2. Continue metoprolol perioperatively 3. Continue to hold Eliquis in anticipation of surgery, and resume as soon as safely possible postoperatively.  4. Will contact [employer] pacemaker rep to reprogram to DOO (dual chamber pacing without sensing) and disable tachyarrhythmia 5. 2D echocardiogram ordered; will  review.  Signed: Clabe Seal PA-C 06/20/2019, 10:10 AM  Discussed with Dr. Saralyn Pilar who agrees with current plan.

## 2019-06-20 NOTE — Progress Notes (Addendum)
[facility] SURGICAL PROGRESS NOTE (cpt [id number])  Hospital Day(s): 1.   Interval History: Patient seen and examined, no acute events or new complaints overnight. Patient reports that her abdominal pain is improved but she continues to have some nausea, denies fever, chills, or emesis. Her bilirubin continues to trend up and is 4.2 today. Patient does report her urine is becoming darker. She was seen and examined by cardiology and recommend reprogramming of BiV ICD. GI also following as well. Will plan for laparoscopic cholecystectomy with IOC tomorrow.   Review of Systems:  Constitutional: denies fever, chills  HEENT: denies cough or congestion  Respiratory: denies any shortness of breath  Cardiovascular: denies chest pain or palpitations  Gastrointestinal: denies abdominal pain, + Nausea, denied Vomiting, or diarrhea/and bowel function as per interval history Genitourinary: denies burning with urination or urinary frequency   Vital signs in last 24 hours: [min-max] current  Temp:  [97.7 F (36.5 C)-98 F (36.7 C)] 97.7 F (36.5 C) (03/09 1143) Pulse Rate:  [67-87] 67 (03/09 1143) Resp:  [16-20] 16 (03/09 1143) BP: (116-158)/(61-77) 131/66 (03/09 1143) SpO2:  [94 %-100 %] 100 % (03/09 1143)     Height: 5\' 5"  (165.1 cm) Weight: 81.6 kg BMI (Calculated): 29.95   Intake/Output last 2 shifts:  03/08 0701 - 03/09 0700 In: 55.4 [I.V.:5.5; IV Piggyback:49.9] Out: 1000 [Urine:1000]   Physical Exam:  Constitutional: alert, cooperative and no distress  HENT: normocephalic without obvious abnormality  Eyes: PERRL, EOM's grossly intact and symmetric  Respiratory: breathing non-labored at rest  Cardiovascular: regular rate and sinus rhythm  Gastrointestinal: soft, non-tender, and non-distended, no rebound/guarding, negative Murphy's Musculoskeletal: no edema or wounds, motor and sensation grossly intact, NT    Labs:  CBC Latest Ref Rng & Units 06/19/2019 07/22/2016 07/21/2016   WBC 4.0 - 10.5 K/uL 6.9 5.2 8.2  Hemoglobin 12.0 - 15.0 g/dL 13.7 11.4(L) 11.2(L)  Hematocrit 36.0 - 46.0 % 42.1 33.9(L) 34.2(L)  Platelets 150 - 400 K/uL 217 133(L) 126(L)   CMP Latest Ref Rng & Units 06/20/2019 06/19/2019 07/22/2016  Glucose 70 - 99 mg/dL 116(H) 120(H) 118(H)  BUN 8 - 23 mg/dL 11 17 10   Creatinine 0.44 - 1.00 mg/dL 0.73 0.70 0.66  Sodium 135 - 145 mmol/L 141 137 138  Potassium 3.5 - 5.1 mmol/L 4.0 4.2 3.3(L)  Chloride 98 - 111 mmol/L 105 101 106  CO2 22 - 32 mmol/L 28 28 26   Calcium 8.9 - 10.3 mg/dL 9.1 9.4 8.6(L)  Total Protein 6.5 - 8.1 g/dL 6.7 7.2 -  Total Bilirubin 0.3 - 1.2 mg/dL 4.2(H) 3.1(H) -  Alkaline Phos 38 - 126 U/L 159(H) 121 -  AST 15 - 41 U/L 847(H) 1,625(H) -  ALT 0 - 44 U/L 914(H) 1,057(H) -     Imaging studies: No new pertinent imaging studies   Assessment/Plan: (ICD-10's: K19.21) 74 y.o. female with worsening hyperbilirubinemia and known cholelithiasis concerning for possible retained CBD stone without evidence suggestive of cholecystitis, complicated by multiple pertinent comorbidities.   - CLD today; NPO at midnight  - Will plan on laparoscopic cholecystectomy with [facility] tomorrow with Dr Celine Ahr pending OR/Anesthesia availability. Cardiology will arrange for ICD re-programming tomorrow at same time  - All risks, benefits, and alternatives to above procedure(s) were discussed with the patient, all of her questions were answered to her expressed satisfaction, patient expresses she wishes to proceed, and informed consent was obtained.   - trend bilirubin levels; appreciate GI assistance  - pain control prn  -  further management per primary service; we will follow   -- Edison Simon, PA-C [facility] 06/20/2019, 1:47 PM [phone] M-F: 7am - 4pm  I saw and evaluated the patient.  I agree with the above documentation, exam, and plan, which I have edited where appropriate. Fredirick Maudlin  5:47 PM

## 2019-06-21 ENCOUNTER — Inpatient Hospital Stay: Payer: Medicare Other | Admitting: Anesthesiology

## 2019-06-21 ENCOUNTER — Inpatient Hospital Stay: Payer: Medicare Other

## 2019-06-21 ENCOUNTER — Encounter: Payer: Self-pay | Admitting: Internal Medicine

## 2019-06-21 ENCOUNTER — Encounter
Admission: EM | Disposition: A | Discharge status: Home / Self Care | Payer: Self-pay | Source: Home / Self Care | Attending: Family Medicine

## 2019-06-21 HISTORY — PX: CHOLECYSTECTOMY: SHX55

## 2019-06-21 LAB — CBC
HCT: 34.1 % — ABNORMAL LOW (ref 36.0–46.0)
Hemoglobin: 10.8 g/dL — ABNORMAL LOW (ref 12.0–15.0)
MCH: 32.5 pg (ref 26.0–34.0)
MCHC: 31.7 g/dL (ref 30.0–36.0)
MCV: 102.7 fL — ABNORMAL HIGH (ref 80.0–100.0)
Platelets: 178 10*3/uL (ref 150–400)
RBC: 3.32 MIL/uL — ABNORMAL LOW (ref 3.87–5.11)
RDW: 13.9 % (ref 11.5–15.5)
WBC: 5.7 10*3/uL (ref 4.0–10.5)
nRBC: 0 % (ref 0.0–0.2)

## 2019-06-21 LAB — COMPREHENSIVE METABOLIC PANEL
ALT: 542 U/L — ABNORMAL HIGH (ref 0–44)
AST: 340 U/L — ABNORMAL HIGH (ref 15–41)
Albumin: 3.3 g/dL — ABNORMAL LOW (ref 3.5–5.0)
Alkaline Phosphatase: 128 U/L — ABNORMAL HIGH (ref 38–126)
Anion gap: 3 — ABNORMAL LOW (ref 5–15)
BUN: 10 mg/dL (ref 8–23)
CO2: 31 mmol/L (ref 22–32)
Calcium: 8.9 mg/dL (ref 8.9–10.3)
Chloride: 107 mmol/L (ref 98–111)
Creatinine, Ser: 0.76 mg/dL (ref 0.44–1.00)
GFR calc Af Amer: >60 mL/min (ref >60)
GFR calc non Af Amer: >60 mL/min (ref >60)
Glucose, Bld: 103 mg/dL — ABNORMAL HIGH (ref 70–99)
Potassium: 3.8 mmol/L (ref 3.5–5.1)
Sodium: 141 mmol/L (ref 135–145)
Total Bilirubin: 2.2 mg/dL — ABNORMAL HIGH (ref 0.3–1.2)
Total Protein: 6 g/dL — ABNORMAL LOW (ref 6.5–8.1)

## 2019-06-21 LAB — VARICELLA-ZOSTER BY PCR: Varicella-Zoster, PCR: NEGATIVE

## 2019-06-21 LAB — CMV DNA, QUANTITATIVE, PCR
CMV DNA Quant: NEGATIVE IU/mL
Log10 CMV Qn DNA Pl: UNDETERMINED log10 IU/mL

## 2019-06-21 SURGERY — LAPAROSCOPIC CHOLECYSTECTOMY WITH INTRAOPERATIVE CHOLANGIOGRAM
Anesthesia: General | Site: Abdomen

## 2019-06-21 MED ORDER — MORPHINE SULFATE (PF) 4 MG/ML IV SOLN
INTRAVENOUS | Status: AC
  Filled 2019-06-21: qty 1

## 2019-06-21 MED ORDER — ONDANSETRON HCL 4 MG/2ML IJ SOLN
INTRAMUSCULAR | Status: DC | PRN
  Administered 2019-06-21: 4 mg via INTRAVENOUS

## 2019-06-21 MED ORDER — ACETAMINOPHEN 10 MG/ML IV SOLN
INTRAVENOUS | Status: DC | PRN
  Administered 2019-06-21: 1000 mg via INTRAVENOUS

## 2019-06-21 MED ORDER — ROCURONIUM BROMIDE 50 MG/5ML IV SOLN
INTRAVENOUS | Status: AC
  Filled 2019-06-21: qty 1

## 2019-06-21 MED ORDER — LIDOCAINE HCL (PF) 2 % IJ SOLN
INTRAMUSCULAR | Status: AC
  Filled 2019-06-21: qty 10

## 2019-06-21 MED ORDER — ROCURONIUM BROMIDE 100 MG/10ML IV SOLN
INTRAVENOUS | Status: DC | PRN
  Administered 2019-06-21: 50 mg via INTRAVENOUS

## 2019-06-21 MED ORDER — PROPOFOL 500 MG/50ML IV EMUL
INTRAVENOUS | Status: AC
  Filled 2019-06-21: qty 50

## 2019-06-21 MED ORDER — PHENYLEPHRINE HCL (PRESSORS) 10 MG/ML IV SOLN
INTRAVENOUS | Status: DC | PRN
  Administered 2019-06-21 (×4): 100 ug via INTRAVENOUS

## 2019-06-21 MED ORDER — DEXAMETHASONE SODIUM PHOSPHATE 10 MG/ML IJ SOLN
INTRAMUSCULAR | Status: DC | PRN
  Administered 2019-06-21: 10 mg via INTRAVENOUS

## 2019-06-21 MED ORDER — LIDOCAINE HCL (CARDIAC) PF 100 MG/5ML IV SOSY
PREFILLED_SYRINGE | INTRAVENOUS | Status: DC | PRN
  Administered 2019-06-21: 60 mg via INTRAVENOUS

## 2019-06-21 MED ORDER — ACETAMINOPHEN 10 MG/ML IV SOLN
INTRAVENOUS | Status: AC
  Filled 2019-06-21: qty 100

## 2019-06-21 MED ORDER — FENTANYL CITRATE (PF) 100 MCG/2ML IJ SOLN
INTRAMUSCULAR | Status: AC
  Filled 2019-06-21: qty 2

## 2019-06-21 MED ORDER — BUPIVACAINE HCL (PF) 0.25 % IJ SOLN
INTRAMUSCULAR | Status: AC
  Filled 2019-06-21: qty 30

## 2019-06-21 MED ORDER — MORPHINE SULFATE (PF) 4 MG/ML IV SOLN
2.0000 mg | INTRAVENOUS | Status: AC | PRN
  Administered 2019-06-21 (×3): 2 mg via INTRAVENOUS

## 2019-06-21 MED ORDER — LIDOCAINE-EPINEPHRINE 1 %-1:100000 IJ SOLN
INTRAMUSCULAR | Status: AC
  Filled 2019-06-21: qty 2

## 2019-06-21 MED ORDER — LIDOCAINE-EPINEPHRINE 1 %-1:100000 IJ SOLN
INTRAMUSCULAR | Status: DC | PRN
  Administered 2019-06-21: 13 mL

## 2019-06-21 MED ORDER — VISTASEAL 10 ML SINGLE DOSE KIT
PACK | CUTANEOUS | Status: AC
  Filled 2019-06-21: qty 10

## 2019-06-21 MED ORDER — MORPHINE SULFATE (PF) 4 MG/ML IV SOLN
INTRAVENOUS | Status: AC
  Administered 2019-06-21: 2 mg via INTRAVENOUS
  Filled 2019-06-21: qty 1

## 2019-06-21 MED ORDER — SUCCINYLCHOLINE CHLORIDE 20 MG/ML IJ SOLN
INTRAMUSCULAR | Status: AC
  Filled 2019-06-21: qty 1

## 2019-06-21 MED ORDER — FENTANYL CITRATE (PF) 100 MCG/2ML IJ SOLN
INTRAMUSCULAR | Status: DC | PRN
  Administered 2019-06-21 (×2): 50 ug via INTRAVENOUS

## 2019-06-21 MED ORDER — HEMOSTATIC AGENTS (NO CHARGE) OPTIME
TOPICAL | Status: DC | PRN
  Administered 2019-06-21: 2 via TOPICAL

## 2019-06-21 MED ORDER — VISTASEAL 10 ML SINGLE DOSE KIT
PACK | CUTANEOUS | Status: DC | PRN
  Administered 2019-06-21: 10 mL via TOPICAL

## 2019-06-21 MED ORDER — PHENYLEPHRINE HCL (PRESSORS) 10 MG/ML IV SOLN
INTRAVENOUS | Status: AC
  Filled 2019-06-21: qty 1

## 2019-06-21 MED ORDER — MORPHINE SULFATE (PF) 2 MG/ML IV SOLN: 2.0000 mg | INTRAVENOUS | Status: DC | PRN

## 2019-06-21 MED ORDER — PROPOFOL 10 MG/ML IV BOLUS
INTRAVENOUS | Status: DC | PRN
  Administered 2019-06-21: 120 mg via INTRAVENOUS

## 2019-06-21 SURGICAL SUPPLY — 50 items
APPLICATOR VISTASEAL 35 (MISCELLANEOUS) ×1 IMPLANT
APPLIER CLIP 5 13 M/L LIGAMAX5 (MISCELLANEOUS) ×2
BLADE SURG SZ11 CARB STEEL (BLADE) ×2 IMPLANT
CANISTER SUCT 1200ML W/VALVE (MISCELLANEOUS) ×2 IMPLANT
CATH CHOLANGI 4FR 420404F (CATHETERS) ×1 IMPLANT
CHLORAPREP W/TINT 26 (MISCELLANEOUS) ×2 IMPLANT
CLIP APPLIE 5 13 M/L LIGAMAX5 (MISCELLANEOUS) ×1 IMPLANT
COVER WAND RF STERILE (DRAPES) ×2 IMPLANT
DECANTER SPIKE VIAL GLASS SM (MISCELLANEOUS) ×4 IMPLANT
DEFOGGER SCOPE WARMER CLEARIFY (MISCELLANEOUS) ×2 IMPLANT
DERMABOND ADVANCED (GAUZE/BANDAGES/DRESSINGS) ×1
DERMABOND ADVANCED .7 DNX12 (GAUZE/BANDAGES/DRESSINGS) ×1 IMPLANT
DISSECTOR BLUNT TIP ENDO 5MM (MISCELLANEOUS) ×1 IMPLANT
ELECT CAUTERY BLADE TIP 2.5 (TIP) ×2
ELECT REM PT RETURN 9FT ADLT (ELECTROSURGICAL) ×2
ELECTRODE CAUTERY BLDE TIP 2.5 (TIP) ×1 IMPLANT
ELECTRODE REM PT RTRN 9FT ADLT (ELECTROSURGICAL) ×1 IMPLANT
GLOVE BIO SURGEON STRL SZ 6.5 (GLOVE) ×2 IMPLANT
GLOVE INDICATOR 7.0 STRL GRN (GLOVE) ×2 IMPLANT
GOWN STRL REUS W/ TWL LRG LVL3 (GOWN DISPOSABLE) ×3 IMPLANT
GOWN STRL REUS W/TWL LRG LVL3 (GOWN DISPOSABLE) ×3
GRASPER SUT TROCAR 14GX15 (MISCELLANEOUS) ×1 IMPLANT
HEMOSTAT SURGICEL 2X3 (HEMOSTASIS) ×2 IMPLANT
IRRIGATION STRYKERFLOW (MISCELLANEOUS) ×1 IMPLANT
IRRIGATOR STRYKERFLOW (MISCELLANEOUS) ×2
IV CATH ANGIO 12GX3 LT BLUE (NEEDLE) ×1 IMPLANT
IV NS 1000ML (IV SOLUTION) ×1
IV NS 1000ML BAXH (IV SOLUTION) ×1 IMPLANT
KIT TURNOVER KIT A (KITS) ×2 IMPLANT
LABEL OR SOLS (LABEL) ×2 IMPLANT
NEEDLE HYPO 22GX1.5 SAFETY (NEEDLE) ×2 IMPLANT
NS IRRIG 500ML POUR BTL (IV SOLUTION) ×2 IMPLANT
PACK LAP CHOLECYSTECTOMY (MISCELLANEOUS) ×2 IMPLANT
PENCIL ELECTRO HAND CTR (MISCELLANEOUS) ×2 IMPLANT
POUCH SPECIMEN RETRIEVAL 10MM (ENDOMECHANICALS) ×2 IMPLANT
SCISSORS METZENBAUM CVD 33 (INSTRUMENTS) ×2 IMPLANT
SET TUBE SMOKE EVAC HIGH FLOW (TUBING) ×2 IMPLANT
SLEEVE ADV FIXATION 5X100MM (TROCAR) ×4 IMPLANT
SOLUTION ELECTROLUBE (MISCELLANEOUS) ×1 IMPLANT
STRIP CLOSURE SKIN 1/2X4 (GAUZE/BANDAGES/DRESSINGS) ×2 IMPLANT
SUT MNCRL 4-0 (SUTURE) ×1
SUT MNCRL 4-0 27XMFL (SUTURE) ×1
SUT VIC AB 3-0 SH 27 (SUTURE) ×1
SUT VIC AB 3-0 SH 27X BRD (SUTURE) ×1 IMPLANT
SUT VICRYL 0 AB UR-6 (SUTURE) ×4 IMPLANT
SUTURE MNCRL 4-0 27XMF (SUTURE) ×1 IMPLANT
SYS KII FIOS ACCESS ABD 5X100 (TROCAR) ×2
SYSTEM KII FIOS ACES ABD 5X100 (TROCAR) ×1 IMPLANT
TROCAR ADV FIXATION 12X100MM (TROCAR) ×2 IMPLANT
WATER STERILE IRR 1000ML POUR (IV SOLUTION) ×2 IMPLANT

## 2019-06-21 NOTE — Transfer of Care (Signed)
Immediate Anesthesia Transfer of Care Note  Patient: Christie Carpenter  Procedure(s) Performed: LAPAROSCOPIC CHOLECYSTECTOMY WITH INTRAOPERATIVE CHOLANGIOGRAM (N/A Abdomen)  Patient Location: PACU  Anesthesia Type:General  Level of Consciousness: drowsy  Airway & Oxygen Therapy: Patient Spontanous Breathing and Patient connected to face mask oxygen  Post-op Assessment: Report given to RN and Post -op Vital signs reviewed and stable  Post vital signs: Reviewed and stable  Last Vitals:  Vitals Value Taken Time  BP 121/65 06/21/19 1555  Temp 36.5 C 06/21/19 1555  Pulse 89 06/21/19 1558  Resp 20 06/21/19 1558  SpO2 100 % 06/21/19 1558  Vitals shown include unvalidated device data.  Last Pain:  Vitals:   06/21/19 1555  TempSrc:   PainSc: 0-No pain         Complications: No apparent anesthesia complications

## 2019-06-21 NOTE — Progress Notes (Addendum)
PROGRESS NOTE    Christie Carpenter  ZOX:[id number] DOB: 08/20/1945 DOA: 06/19/2019 PCP: Ellamae Sia, MD   Brief Narrative:  Christie Carpenter Marinus Eicher 74 y.o.femalewith medical history significant fornon-Hodgkin's lymphoma,CHF,coronary artery disease,diabetes mellitus,S/p mitral valve repair,hypothyroidism,history of atrial fibrillation status post pacemaker/AICD insertionwho presents to the emergency room for evaluation of abdominal pain which started in her LLQ and radiated tothe right upper quadrant. Pain started several hours after Christie Carpenter meal. Sherated her painan 8 x 10 in intensity at its worst.Pain has no relieving or aggravating factors.Patient said she has had episodes of pain like this in the past but not so intense.Abdominal pain has been associated with nausea,vomiting and diaphoresis. She denies having any changesinher bowel habits.She denies having any chest pain,shortness of breath,palpitations,fever,chills,tinnitus or lightheadedness.Labs revealed elevated liver enzymes in the 1000/s,with elevated total bilirubin and normal alkaline phosphatase level.CT scan of the abdomen and pelvis showed gallbladder wall thickening and cholelithiasis consistent with early cholecystitis.Gallbladder ultrasound showed cholelithiasis  The emergency room for evaluation of right upper quadrant quadrant pain,labs indicated significant transaminitis and elevated lipase suspicious for choledocholithiasis.Bilirubin was elevated at 3.1 and CT scan wassuggestive of cholelithiasis  [facility] have been consulted to admit the patient for further evaluation and treatment. General surgery, cardiology, and gastroenterology have been consulted. Surgery will have to wait 48 hours as the patient was on Eliquis upon presentation. Depending on the results of IOC, GI will then take the patient for ERCP. Cardiology has been consulted and echocardiogram ordered for pre-op  clearance.  Assessment & Plan:   Principal Problem:   Cholelithiasis with acute cholecystitis Active Problems:   CHF (congestive heart failure) (HCC)   Hypothyroidism   CAD (coronary artery disease)   Calculus of gallbladder with acute cholecystitis   AF (paroxysmal atrial fibrillation) (HCC)  Cholelithiasis with acutecholecystitis  Elevated Liver Enzyems: Patient presented for evaluation of right upper quadrant pain associated with nausea and vomiting,labs revealed markedly elevated transaminase levels and elevated total bilirubin levels as well. CT scan of abdomen and pelvisissuggestive of early acute cholecystitis. RUQ [place] with cholelithiasis with boderline gallbladder wall thicknening, indeterminate for acute cholecystitis LFT's improving S/p cholecystectomy 3/10 with IOC which showed common bile duct stones GI is following, plan for ERCP 3/11    NPO at midnight Continue zosyn for abx.  Blood cx NGTD.  Continue to hold anticoagulation.  Hypothyroidism: Continue Synthroid  CAD/Chronic diastolic dysfunction CHF: Echocardiogram performed today demonstrated EF of 81-19% with no diastolic dysfunction or RV dysfunction. Monitor the patient for volume status.  Continuemetoprolol,lisinopril and spironolactone.  Cardiology has been consulted for pre-op clearance from Lorian Yaun cardiac standpoint. Appreciate assistance  Paroxysmal atrial fibrillation: Pt has Bi-v Pacer.  Eliquis currently on hold  Resume as soon as safely possible post op per cardiology  S/p Pacemaker Placement: pacemaker reprogrammed to DOO prior to surgery per cards  DVT prophylaxis: SCD Code Status:full  Family Communication: none at bedside - offered to update family, she's planning to update them Disposition Plan:  . Patient came from: home           . Anticipated d/c place: home . Barriers to d/c OR conditions which need to be met to effect Kort Stettler safe d/c: pending ERCP by GI.  S/p cholecystectomy today by  surgery.  Pending clearance by GI/surgery.  Consultants:   GI  Surgery  Cardiology  Procedures:  Echo IMPRESSIONS    1. Left ventricular ejection fraction, by estimation, is 50 to 55%. The  left ventricle has low normal function. The  left ventricle has no regional  wall motion abnormalities. There is mild left ventricular hypertrophy.  Left ventricular diastolic  parameters were normal.  2. Right ventricular systolic function is normal. The right ventricular  size is normal. There is mildly elevated pulmonary artery systolic  pressure.  3. The mitral valve is normal in structure. Mild to moderate mitral valve  regurgitation. No evidence of mitral stenosis.  4. The aortic valve is normal in structure. Aortic valve regurgitation is  mild to moderate. Moderate aortic valve stenosis.  5. The inferior vena cava is normal in size with greater than 50%  respiratory variability, suggesting right atrial pressure of 3 mmHg.   3/10 lap chole, surgery  Antimicrobials:  Anti-infectives (From admission, onward)   Start     Dose/Rate Route Frequency Ordered Stop   06/19/19 2200  [MAR Hold]  piperacillin-tazobactam (ZOSYN) IVPB 3.375 g     (MAR Hold since Wed 06/21/2019 at 1113.Hold Reason: Transfer to [facility] Procedural area.)   3.375 g 12.5 mL/hr over 240 Minutes Intravenous Every 8 hours 06/19/19 1622     06/19/19 1400  piperacillin-tazobactam (ZOSYN) IVPB 3.375 g     3.375 g 100 mL/hr over 30 Minutes Intravenous  Once 06/19/19 1348 06/19/19 1427     Subjective: Comfortable prior to surgery Seen before she was taken down to OR  Objective: Vitals:   06/21/19 0506 06/21/19 0820 06/21/19 1116 06/21/19 1555  BP: (!) 127/59 136/70 (!) 169/71   Pulse: 69 73 71   Resp: _0 Temp: 98 F (36.7 C) 97.8 F (36.6 C) 98.1 F (36.7 C) (P) 97.7 F (36.5 C)  TempSrc: Oral  Temporal   SpO2: 98% 100% 100%   Weight:   81 kg   Height:   _1  (1.651 m)     Intake/Output Summary  (Last 24 hours) at 06/21/2019 1558 Last data filed at 06/21/2019 1515 Gross per 24 hour  Intake 900 ml  Output 1755 ml  Net -855 ml   Filed Weights   06/19/19 0917 06/21/19 1116  Weight: 81.6 kg 81 kg    Examination:  General exam: Appears calm and comfortable  Respiratory system: Clear to auscultation. Respiratory effort normal. Cardiovascular system: S1 & S2 heard, RRR. Gastrointestinal system: Abdomen is nondistended, soft and nontender.  Central nervous system: Alert and oriented. No focal neurological deficits. Extremities: no LEE Skin: No rashes, lesions or ulcers Psychiatry: Judgement and insight appear normal. Mood & affect appropriate.     Data Reviewed: I have personally reviewed following labs and imaging studies  CBC: Recent Labs  Lab 06/19/19 0955 06/21/19 0501  WBC 6.9 5.7  NEUTROABS 4.7  --   HGB 13.7 10.8*  HCT 42.1 34.1*  MCV 101.2* 102.7*  PLT 217 248   Basic Metabolic Panel: Recent Labs  Lab 06/19/19 1131 06/20/19 0455 06/21/19 0501  NA 137 141 141  K 4.2 4.0 3.8  CL 101 105 107  CO2 _2 GLUCOSE 120* 116* 103*  BUN _3 CREATININE 0.70 0.73 0.76  CALCIUM 9.4 9.1 8.9   GFR: Estimated Creatinine Clearance: 64.9 mL/min (by C-G formula based on SCr of 0.76 mg/dL). Liver Function Tests: Recent Labs  Lab 06/19/19 1131 06/20/19 0455 06/21/19 0501  AST 1,625* 847* 340*  ALT 1,057* 914* 542*  ALKPHOS 121 159* 128*  BILITOT 3.1* 4.2* 2.2*  PROT 7.2 6.7 6.0*  ALBUMIN 4.0 3.7 3.3*   Recent Labs  Lab 06/19/19 1131  LIPASE 167*   No results for input(s): AMMONIA in the last 168 hours. Coagulation Profile: Recent Labs  Lab 06/19/19 1547 06/20/19 0455  INR 0.9 1.0   Cardiac Enzymes: Recent Labs  Lab 06/19/19 1547  CKTOTAL 60   BNP (last 3 results) No results for input(s): PROBNP in the last 8760 hours. HbA1C: No results for input(s): HGBA1C in the last 72 hours. CBG: No results for input(s): GLUCAP in the last  168 hours. Lipid Profile: No results for input(s): CHOL, HDL, LDLCALC, TRIG, CHOLHDL, LDLDIRECT in the last 72 hours. Thyroid Function Tests: No results for input(s): TSH, T4TOTAL, FREET4, T3FREE, THYROIDAB in the last 72 hours. Anemia Panel: No results for input(s): VITAMINB12, FOLATE, FERRITIN, TIBC, IRON, RETICCTPCT in the last 72 hours. Sepsis Labs: No results for input(s): PROCALCITON, LATICACIDVEN in the last 168 hours.  Recent Results (from the past 240 hour(s))  Respiratory Panel by RT PCR (Flu Shaquaya Wuellner&B, Covid) - Nasopharyngeal Swab     Status: None   Collection Time: 06/19/19  4:48 PM   Specimen: Nasopharyngeal Swab  Result Value Ref Range Status   SARS Coronavirus 2 by RT PCR NEGATIVE NEGATIVE Final    Comment: (NOTE) SARS-CoV-2 target nucleic acids are NOT DETECTED. The SARS-CoV-2 RNA is generally detectable in upper respiratoy specimens during the acute phase of infection. The lowest concentration of SARS-CoV-2 viral copies this assay can detect is 131 copies/mL. Jarin Cornfield negative result does not preclude SARS-Cov-2 infection and should not be used as the sole basis for treatment or other patient management decisions. Symia Herdt negative result may occur with  improper specimen collection/handling, submission of specimen other than nasopharyngeal swab, presence of viral mutation(s) within the areas targeted by this assay, and inadequate number of viral copies (<131 copies/mL). Marco Adelson negative result must be combined with clinical observations, patient history, and epidemiological information. The expected result is Negative. Fact Sheet for Patients:  PinkCheek.be Fact Sheet for Healthcare Providers:  GravelBags.it This test is not yet ap proved or cleared by the [place] FDA and  has been authorized for detection and/or diagnosis of SARS-CoV-2 by FDA under an Emergency Use Authorization (EUA). This EUA will remain  in effect  (meaning this test can be used) for the duration of the COVID-19 declaration under Section 564(b)(1) of the Act, 21 U.S.C. section 360bbb-3(b)(1), unless the authorization is terminated or revoked sooner.    Influenza Markel Kurtenbach by PCR NEGATIVE NEGATIVE Final   Influenza B by PCR NEGATIVE NEGATIVE Final    Comment: (NOTE) The Xpert Xpress SARS-CoV-2/FLU/RSV assay is intended as an aid in  the diagnosis of influenza from Nasopharyngeal swab specimens and  should not be used as Johnice Riebe sole basis for treatment. Nasal washings and  aspirates are unacceptable for Xpert Xpress SARS-CoV-2/FLU/RSV  testing. Fact Sheet for Patients: PinkCheek.be Fact Sheet for Healthcare Providers: GravelBags.it This test is not yet approved or cleared by the [place] FDA and  has been authorized for detection and/or diagnosis of SARS-CoV-2 by  FDA under an Emergency Use Authorization (EUA). This EUA will remain  in effect (meaning this test can be used) for the duration of the  Covid-19 declaration under Section 564(b)(1) of the Act, 21  U.S.C. section 360bbb-3(b)(1), unless the authorization is  terminated or revoked. Performed at [facility], [place], [place] [address]   CULTURE, BLOOD (ROUTINE X 2) w Reflex to ID Panel     Status: None (Preliminary result)   Collection Time: 06/20/19  2:57 PM  Specimen: BLOOD  Result Value Ref Range Status   Specimen Description BLOOD BLOOD LEFT HAND  Final   Special Requests   Final    BOTTLES DRAWN AEROBIC ONLY Blood Culture results may not be optimal due to an inadequate volume of blood received in culture bottles   Culture   Final    NO GROWTH < 24 HOURS Performed at [facility], [address] [place], [place] [address]    Report Status PENDING  Incomplete  CULTURE, BLOOD (ROUTINE X 2) w Reflex to ID Panel     Status: None (Preliminary result)   Collection Time: 06/20/19   2:57 PM   Specimen: BLOOD  Result Value Ref Range Status   Specimen Description BLOOD BLOOD LEFT HAND  Final   Special Requests   Final    BOTTLES DRAWN AEROBIC ONLY Blood Culture results may not be optimal due to an inadequate volume of blood received in culture bottles   Culture   Final    NO GROWTH < 24 HOURS Performed at [facility], [address] [place], [place] [address]    Report Status PENDING  Incomplete         Radiology Studies: ECHOCARDIOGRAM COMPLETE  Result Date: 06/20/2019    ECHOCARDIOGRAM REPORT   Patient Name:   SUMMERLYNN GLAUSER Date of Exam: 06/20/2019 Medical Rec #:  [id number]    Height:       65.0 in Accession #:    [id number]   Weight:       180.0 lb Date of Birth:  04-Feb-1946    BSA:          1.892 m Patient Age:    22 years     BP:           158/69 mmHg Patient Gender: F            HR:           84 bpm. Exam Location:  [facility] Procedure: 2D Echo, Color Doppler and Cardiac Doppler STAT ECHO Indications:     Preoperative evaluation  History:         Patient has no prior history of Echocardiogram examinations.                  Pacemaker, Signs/Symptoms:Shortness of Breath, Dyspnea and                  Murmur; Risk Factors:Diabetes. Mitral valve problem.  Sonographer:     Sherrie Sport RDCS (AE) Referring Phys:  [id number] AVA SWAYZE Diagnosing Phys: Isaias Cowman MD IMPRESSIONS  1. Left ventricular ejection fraction, by estimation, is 50 to 55%. The left ventricle has low normal function. The left ventricle has no regional wall motion abnormalities. There is mild left ventricular hypertrophy. Left ventricular diastolic parameters were normal.  2. Right ventricular systolic function is normal. The right ventricular size is normal. There is mildly elevated pulmonary artery systolic pressure.  3. The mitral valve is normal in structure. Mild to moderate mitral valve regurgitation. No evidence of mitral stenosis.  4. The aortic valve is normal in structure. Aortic valve  regurgitation is mild to moderate. Moderate aortic valve stenosis.  5. The inferior vena cava is normal in size with greater than 50% respiratory variability, suggesting right atrial pressure of 3 mmHg. FINDINGS  Left Ventricle: Left ventricular ejection fraction, by estimation, is 50 to 55%. The left ventricle has low normal function. The left ventricle has no regional wall motion abnormalities. The  left ventricular internal cavity size was normal in size. There is mild left ventricular hypertrophy. Left ventricular diastolic parameters were normal. Right Ventricle: The right ventricular size is normal. No increase in right ventricular wall thickness. Right ventricular systolic function is normal. There is mildly elevated pulmonary artery systolic pressure. The tricuspid regurgitant velocity is 2.31  m/s, and with an assumed right atrial pressure of 10 mmHg, the estimated right ventricular systolic pressure is 37.3 mmHg. Left Atrium: Left atrial size was normal in size. Right Atrium: Right atrial size was normal in size. Pericardium: There is no evidence of pericardial effusion. Mitral Valve: The mitral valve is normal in structure. Normal mobility of the mitral valve leaflets. Mild to moderate mitral valve regurgitation. No evidence of mitral valve stenosis. MV peak gradient, 11.0 mmHg. The mean mitral valve gradient is 4.0 mmHg. Tricuspid Valve: The tricuspid valve is normal in structure. Tricuspid valve regurgitation is mild . No evidence of tricuspid stenosis. Aortic Valve: The aortic valve is normal in structure. Aortic valve regurgitation is mild to moderate. Moderate aortic stenosis is present. Aortic valve mean gradient measures 10.2 mmHg. Aortic valve peak gradient measures 15.9 mmHg. Aortic valve area, by VTI measures 1.32 cm. Pulmonic Valve: The pulmonic valve was normal in structure. Pulmonic valve regurgitation is not visualized. No evidence of pulmonic stenosis. Aorta: The aortic root is normal in  size and structure. Venous: The inferior vena cava is normal in size with greater than 50% respiratory variability, suggesting right atrial pressure of 3 mmHg. IAS/Shunts: No atrial level shunt detected by color flow Doppler.  LEFT VENTRICLE PLAX 2D LVIDd:         3.73 cm  Diastology LVIDs:         2.33 cm  LV e' lateral:   6.53 cm/s LV PW:         1.05 cm  LV E/e' lateral: 22.8 LV IVS:        1.38 cm  LV e' medial:    5.44 cm/s LVOT diam:     2.00 cm  LV E/e' medial:  27.4 LV SV:         63 LV SV Index:   34 LVOT Area:     3.14 cm  RIGHT VENTRICLE RV Basal diam:  3.39 cm RV S prime:     12.80 cm/s TAPSE (M-mode): 4.0 cm LEFT ATRIUM              Index       RIGHT ATRIUM           Index LA diam:        3.10 cm  1.64 cm/m  RA Area:     19.30 cm LA Vol (A2C):   130.0 ml 68.73 ml/m RA Volume:   59.30 ml  31.35 ml/m LA Vol (A4C):   63.9 ml  33.78 ml/m LA Biplane Vol: 92.2 ml  48.74 ml/m  AORTIC VALVE                    PULMONIC VALVE AV Area (Vmax):    1.35 cm     PV Vmax:        0.66 m/s AV Area (Vmean):   1.30 cm     PV Peak grad:   1.7 mmHg AV Area (VTI):     1.32 cm     RVOT Peak grad: 2 mmHg AV Vmax:           199.56 cm/s AV Vmean:  144.480 cm/s AV VTI:            0.482 m AV Peak Grad:      15.9 mmHg AV Mean Grad:      10.2 mmHg LVOT Vmax:         85.70 cm/s LVOT Vmean:        59.900 cm/s LVOT VTI:          0.202 m LVOT/AV VTI ratio: 0.42  AORTA Ao Root diam: 2.60 cm MITRAL VALVE                TRICUSPID VALVE MV Area (PHT): 2.32 cm     TR Peak grad:   21.3 mmHg MV Peak grad:  11.0 mmHg    TR Vmax:        231.00 cm/s MV Mean grad:  4.0 mmHg MV Vmax:       1.66 m/s     SHUNTS MV Vmean:      87.6 cm/s    Systemic VTI:  0.20 m MV Decel Time: 327 msec     Systemic Diam: 2.00 cm MV E velocity: 149.00 cm/s MV Saul Fabiano velocity: 66.90 cm/s MV E/Jovanni Eckhart ratio:  2.23 Isaias Cowman MD Electronically signed by Isaias Cowman MD Signature Date/Time: 06/20/2019/1:07:26 PM    Final         Scheduled Meds: .  [MAR Hold] levothyroxine  112 mcg Oral q morning - 10a  . [MAR Hold] lisinopril  2.5 mg Oral Daily  . [MAR Hold] metoprolol succinate  25 mg Oral Daily  . [MAR Hold]  morphine injection  2 mg Intravenous Once  . [MAR Hold] multivitamin with minerals  1 tablet Oral Daily  . [MAR Hold] spironolactone  12.5 mg Oral Daily   Continuous Infusions: . sodium chloride 50 mL/hr at 06/21/19 0831  . [MAR Hold] piperacillin-tazobactam (ZOSYN)  IV 3.375 g (06/21/19 0516)     LOS: 2 days    Time spent: over 65 min    Fayrene Helper, MD [facility]   To contact the attending provider between 7A-7P or the covering provider during after hours 7P-7A, please log into the web site [email or web address] and access using universal [place] password for that web site. If you do not have the password, please call the hospital operator.  06/21/2019, 3:58 PM

## 2019-06-21 NOTE — Progress Notes (Signed)
Informed I/O cholangiogram shows stone  Will plan for ERCP tomorrow afternoon with Dr Allen Norris   Dr Jonathon Bellows MD,MRCP [facility]) Gastroenterology/Hepatology Pager: [phone]

## 2019-06-21 NOTE — Anesthesia Preprocedure Evaluation (Addendum)
Anesthesia Evaluation  Patient identified by MRN, date of birth, ID band Patient awake    Reviewed: Allergy & Precautions, H&P , NPO status , Patient's Chart, lab work & pertinent test results  Airway Mallampati: II  TM Distance: >3 FB Neck ROM: full    Dental  (+) Partial Upper   Pulmonary shortness of breath, former smoker,  Pulmonary hypertension - mild on echo 06/20/19          Cardiovascular + CAD and +CHF  + pacemaker (pacemaker dependent.  H/o CHB) + Valvular Problems/Murmurs (s/p mitral valve repair)  Rhythm:regular Rate:Normal  Pacemaker interrogated and re-programmed prior to procedure  Echo 06/20/19  1. Left ventricular ejection fraction, by estimation, is 50 to 55%. The  left ventricle has low normal function. The left ventricle has no regional  wall motion abnormalities. There is mild left ventricular hypertrophy.  Left ventricular diastolic  parameters were normal.  2. Right ventricular systolic function is normal. The right ventricular  size is normal. There is mildly elevated pulmonary artery systolic  pressure.  3. The mitral valve is normal in structure. Mild to moderate mitral valve  regurgitation. No evidence of mitral stenosis.  4. The aortic valve is normal in structure. Aortic valve regurgitation is  mild to moderate. Moderate aortic valve stenosis.  5. The inferior vena cava is normal in size with greater than 50%  respiratory variability, suggesting right atrial pressure of 3 mmHg.    Neuro/Psych PSYCHIATRIC DISORDERS Depression negative neurological ROS     GI/Hepatic negative GI ROS, Neg liver ROS,   Endo/Other  diabetesHypothyroidism   Renal/GU      Musculoskeletal   Abdominal   Peds  Hematology negative hematology ROS (+)   Anesthesia Other Findings Past Medical History: 1987: Cancer ([place])     Comment:  non hodgkins lymphoma No date: CHF (congestive heart failure) (HCC) No  date: Coronary artery disease No date: Depression No date: Diabetes mellitus without complication (HCC) No date: Heart murmur No date: Hypothyroidism No date: Mitral valve problem No date: Presence of permanent cardiac pacemaker No date: Pulmonary hypertension (HCC) No date: Shortness of breath dyspnea  Past Surgical History: No date: PACEMAKER INSERTION  BMI    Body Mass Index: 29.95 kg/m      Reproductive/Obstetrics negative OB ROS                            Anesthesia Physical Anesthesia Plan  ASA: III  Anesthesia Plan: General ETT   Post-op Pain Management:    Induction:   PONV Risk Score and Plan: Ondansetron, Dexamethasone and Treatment may vary due to age or medical condition  Airway Management Planned:   Additional Equipment:   Intra-op Plan:   Post-operative Plan:   Informed Consent: I have reviewed the patients History and Physical, chart, labs and discussed the procedure including the risks, benefits and alternatives for the proposed anesthesia with the patient or authorized representative who has indicated his/her understanding and acceptance.     Dental Advisory Given  Plan Discussed with: Anesthesiologist  Anesthesia Plan Comments:         Anesthesia Quick Evaluation

## 2019-06-21 NOTE — Op Note (Signed)
Laparoscopic Cholecystectomy  Pre-operative Diagnosis: Acute calculus cholecystitis  Post-operative Diagnosis: Same, retained common bile duct stone.  Procedure: Laparoscopic cholecystectomy with intraoperative cholangiogram  Surgeon: Fredirick Maudlin, MD  Anesthesia: GETA  Assistant: None   Findings: Significant adhesions of the omentum to the liver consistent with chronic cholecystitis.  Inflammation consistent with acute on chronic cholecystitis.  Innumerable stones within the gallbladder.  Friable liver capsule.  Intraoperative cholangiogram consistent with a retained stone at the distal common bile duct.  Estimated Blood Loss: Less than 10 cc         Drains: None         Specimens: Gallbladder           Complications: none   Procedure Details  The patient was seen again in the preoperative holding area. The benefits, complications, treatment options, and expected outcomes were discussed with the patient. The risks of bleeding, infection, recurrence of symptoms, failure to resolve symptoms, bile duct damage, bile duct leak, retained common bile duct stone, bowel injury, any of which could require further surgery and/or ERCP, stent, or papillotomy were reviewed with the patient. The likelihood of improving the patient's symptoms with return to their baseline status is good.  The patient and/or family concurred with the proposed plan, giving informed consent.  The patient was taken to operating room, identified as Christie Carpenter and the procedure verified as Laparoscopic Cholecystectomy. A time out was performed and the above information confirmed.  Prior to the induction of general anesthesia, antibiotic prophylaxis was administered. VTE prophylaxis was in place. General endotracheal anesthesia was then administered and tolerated well. After the induction, the abdomen was prepped with Chloraprep and draped in sterile fashion. The patient was positioned in the supine  position.  Optiview technique was used to enter the abdominal cavity with a 5 mm trocar in the right upper quadrant just below the costal margin in the midclavicular line.  Pneumoperitoneum was then created with CO2 and tolerated well without any adverse changes in the patient's vital signs.  A 10 mm supraumbilical port was placed and 2 additional 5-mm ports were placed in the right upper quadrant,  all under direct vision. All skin incisions  were infiltrated with a local anesthetic agent before making the incision and placing the trocars.   The patient was positioned  in reverse Trendelenburg, tilted slightly to the patient's left.  There were significant adhesions of the omentum to the liver capsule.  These were carefully lysed using electrocautery and gentle blunt dissection.  Despite gentle handling, the liver capsule was somewhat friable and tore easily.  Bleeding was controlled with electrocautery.  Once this was completed, the gallbladder was identified, the fundus grasped and retracted cephalad.  Additional dhesions were lysed bluntly. The infundibulum was grasped and retracted laterally, exposing the peritoneum overlying the triangle of Calot. This was then divided and exposed in a blunt fashion. An extended critical view of the cystic duct and cystic artery was obtained.  The cystic artery was doubly clipped and divided.  The cystic duct was clearly identified and bluntly dissected free.  We made a small ductotomy, which resulted in spillage of copious thick green bile and multiple small stones.  The cholangiocatheter was introduced through the ductotomy and secured with a Ligaclip.  Saline was injected and we had free flow of fluid.  An intraoperative cholangiogram was then performed.  Imaging confirmed the presence of a filling defect near the distal common bile duct.  The cholangiocatheter was removed.  The cystic  duct was then doubly clipped and divided.   The gallbladder was taken from the  gallbladder fossa in a retrograde fashion with the electrocautery. The gallbladder was removed and placed in an Endo pouch bag. The gallbladder and Endocatch sac were then removed through a port site.   The liver bed was irrigated and inspected. Hemostasis was achieved with the electrocautery. Copious saline irrigation was utilized and was repeatedly aspirated until clear.  A stone grasper was used to remove all of the spilled gallstones.  Due to continued minor oozing from the liver bed, Vistaseal was applied for additional hemostasis.  Inspection of the right upper quadrant was performed. No bleeding, bile duct injury or leak, or bowel injury was noted. Pneumoperitoneum was released.  The periumbilical port site was closed with interrumpted 0 Vicryl sutures. 4-0 subcuticular Monocryl was used to close the skin. Dermabond was applied.  The patient was then extubated and brought to the recovery room in stable condition. Sponge, lap, and needle counts were correct at closure and at the conclusion of the case.               Fredirick Maudlin, MD, FACS

## 2019-06-21 NOTE — Consult Note (Signed)
Pharmacy Antibiotic Note  Christie Carpenter is a 74 y.o. female admitted on 06/19/2019 with cholelithiasis.  Pharmacy was consulted for Zosyn dosing. This is day #2 of Zosyn. Dr Celine Ahr is planning for a laparoscopic cholecystectomy with IOC today. His renal function is stable with no leukocytosis or recent febrile episodes noted.  Plan: Continue Zosyn  3.375 g Q8H (extended infusion)   Height: 5\' 5"  (165.1 cm) Weight: 180 lb (81.6 kg) IBW/kg (Calculated) : 57  Temp (24hrs), Avg:97.8 F (36.6 C), Min:97.7 F (36.5 C), Max:98 F (36.7 C)  Recent Labs  Lab 06/19/19 0955 06/19/19 1131 06/20/19 0455 06/21/19 0501  WBC 6.9  --   --  5.7  CREATININE  --  0.70 0.73 0.76    Estimated Creatinine Clearance: 65.1 mL/min (by C-G formula based on SCr of 0.76 mg/dL).    Antimicrobials this admission: Zosyn 3/8 >>  Microbiology results: 3/9 BCx: NGTD 3/8 SARS CoV-2 negative 3/8 influenza A/B: negative  Thank you for allowing pharmacy to be a part of this patient's care.  Dallie Piles 06/21/2019 8:51 AM

## 2019-06-21 NOTE — Progress Notes (Addendum)
[place] Hospital Day(s): 2.   Interval History:  Patient seen and examined no acute events or new complaints overnight.  Patient reports she is feeling better this morning compared to yesterday No fever, chills, nausea, or emesis Hyperbilirubinemia improved this morning to 2.2, LFTs also improving Plan for laparoscopic cholecystectomy with IOC today with Dr Celine Ahr No new complaints.   Vital signs in last 24 hours: [min-max] current  Temp:  [97.7 F (36.5 C)-98 F (36.7 C)] 98 F (36.7 C) (03/10 0506) Pulse Rate:  [67-84] 69 (03/10 0506) Resp:  [16-20] 16 (03/10 0506) BP: (103-158)/(59-72) 127/59 (03/10 0506) SpO2:  [94 %-100 %] 98 % (03/10 0506)     Height: 5\' 5"  (165.1 cm) Weight: 81.6 kg BMI (Calculated): 29.95   Intake/Output last 2 shifts:  03/09 0701 - 03/10 0700 In: -  Out: 2100 [Urine:2100]   Physical Exam:  Constitutional: alert, cooperative and no distress  HENT: normocephalic without obvious abnormality  Eyes: PERRL, EOM's grossly intact and symmetric  Respiratory: breathing non-labored at rest  Cardiovascular: regular rate and sinus rhythm  Gastrointestinal: soft, non-tender, and non-distended, no rebound/guarding, negative Murphy's Musculoskeletal: no edema or wounds, motor and sensation grossly intact, NT    Labs:  CBC Latest Ref Rng & Units 06/21/2019 06/19/2019 07/22/2016  WBC 4.0 - 10.5 K/uL 5.7 6.9 5.2  Hemoglobin 12.0 - 15.0 g/dL 10.8(L) 13.7 11.4(L)  Hematocrit 36.0 - 46.0 % 34.1(L) 42.1 33.9(L)  Platelets 150 - 400 K/uL 178 217 133(L)   CMP Latest Ref Rng & Units 06/21/2019 06/20/2019 06/19/2019  Glucose 70 - 99 mg/dL 103(H) 116(H) 120(H)  BUN 8 - 23 mg/dL 10 11 17   Creatinine 0.44 - 1.00 mg/dL 0.76 0.73 0.70  Sodium 135 - 145 mmol/L 141 141 137  Potassium 3.5 - 5.1 mmol/L 3.8 4.0 4.2  Chloride 98 - 111 mmol/L 107 105 101  CO2 22 - 32 mmol/L 31 28 28   Calcium 8.9 - 10.3 mg/dL 8.9 9.1 9.4  Total Protein 6.5  - 8.1 g/dL 6.0(L) 6.7 7.2  Total Bilirubin 0.3 - 1.2 mg/dL 2.2(H) 4.2(H) 3.1(H)  Alkaline Phos 38 - 126 U/L 128(H) 159(H) 121  AST 15 - 41 U/L 340(H) 847(H) 1,625(H)  ALT 0 - 44 U/L 542(H) 914(H) 1,057(H)    Imaging studies: No new pertinent imaging studies   Assessment/Plan: (ICD-10's: K66.21) 74 y.o. female with now improving hyperbilirubinemia and known cholelithiasis concerning for possible retained vs passed CBD stone without evidence suggestive of cholecystitis, complicated by multiple pertinent comorbidities.   - NPO + IVF resuscitation   - Will plan on laparoscopic cholecystectomy with IOC today with Dr Celine Ahr pending OR/Anesthesia availability. Cardiology will arrange for ICD re-programming today as well             - All risks, benefits, and alternatives to above procedure(s) were discussed with the patient, all of her questions were answered to her expressed satisfaction, patient expresses she wishes to proceed, and informed consent was obtained.              - trend bilirubin levels; appreciate GI assistance             - pain control prn             - further management per primary service; we will follow   All of the above findings and recommendations were discussed with the patient, and the medical team, and all of patient's questions were answered to her  expressed satisfaction.  -- Edison Simon, PA-C [facility] 06/21/2019, 8:02 AM [phone] M-F: 7am - 4pm   I saw and evaluated the patient.  I agree with the above documentation, exam, and plan, which I have edited where appropriate. Will proceed with surgery today.  Fredirick Maudlin  12:44 PM

## 2019-06-21 NOTE — Anesthesia Procedure Notes (Signed)
Procedure Name: Intubation Date/Time: 06/21/2019 1:21 PM Performed by: Justus Memory, CRNA Pre-anesthesia Checklist: Patient identified, Patient being monitored, Timeout performed, Emergency Drugs available and Suction available Patient Re-evaluated:Patient Re-evaluated prior to induction Oxygen Delivery Method: Circle system utilized Preoxygenation: Pre-oxygenation with 100% oxygen Induction Type: IV induction Ventilation: Mask ventilation without difficulty and Oral airway inserted - appropriate to patient size Laryngoscope Size: 3 and McGraph Grade View: Grade I Tube type: Oral Tube size: 7.0 mm Number of attempts: 1 Airway Equipment and Method: Stylet and Video-laryngoscopy Placement Confirmation: ETT inserted through vocal cords under direct vision,  positive ETCO2 and breath sounds checked- equal and bilateral Secured at: 21 cm Tube secured with: Tape Dental Injury: Teeth and Oropharynx as per pre-operative assessment  Difficulty Due To: Difficulty was anticipated and Difficult Airway- due to anterior larynx Future Recommendations: Recommend- induction with short-acting agent, and alternative techniques readily available

## 2019-06-22 ENCOUNTER — Inpatient Hospital Stay: Payer: Medicare Other | Admitting: Anesthesiology

## 2019-06-22 ENCOUNTER — Encounter
Admission: EM | Disposition: A | Discharge status: Home / Self Care | Payer: Self-pay | Source: Home / Self Care | Attending: Family Medicine

## 2019-06-22 ENCOUNTER — Inpatient Hospital Stay: Payer: Medicare Other

## 2019-06-22 DIAGNOSIS — K311 Adult hypertrophic pyloric stenosis: Secondary | ICD-10-CM

## 2019-06-22 DIAGNOSIS — K8050 Calculus of bile duct without cholangitis or cholecystitis without obstruction: Secondary | ICD-10-CM

## 2019-06-22 HISTORY — PX: ENDOSCOPIC RETROGRADE CHOLANGIOPANCREATOGRAPHY (ERCP) WITH PROPOFOL: SHX5810

## 2019-06-22 LAB — CBC
HCT: 35.8 % — ABNORMAL LOW (ref 36.0–46.0)
Hemoglobin: 11.2 g/dL — ABNORMAL LOW (ref 12.0–15.0)
MCH: 32.6 pg (ref 26.0–34.0)
MCHC: 31.3 g/dL (ref 30.0–36.0)
MCV: 104.1 fL — ABNORMAL HIGH (ref 80.0–100.0)
Platelets: 203 10*3/uL (ref 150–400)
RBC: 3.44 MIL/uL — ABNORMAL LOW (ref 3.87–5.11)
RDW: 13.8 % (ref 11.5–15.5)
WBC: 8.4 10*3/uL (ref 4.0–10.5)
nRBC: 0 % (ref 0.0–0.2)

## 2019-06-22 LAB — COMPREHENSIVE METABOLIC PANEL
ALT: 407 U/L — ABNORMAL HIGH (ref 0–44)
AST: 211 U/L — ABNORMAL HIGH (ref 15–41)
Albumin: 3.4 g/dL — ABNORMAL LOW (ref 3.5–5.0)
Alkaline Phosphatase: 119 U/L (ref 38–126)
Anion gap: 8 (ref 5–15)
BUN: 14 mg/dL (ref 8–23)
CO2: 25 mmol/L (ref 22–32)
Calcium: 8.8 mg/dL — ABNORMAL LOW (ref 8.9–10.3)
Chloride: 105 mmol/L (ref 98–111)
Creatinine, Ser: 0.77 mg/dL (ref 0.44–1.00)
GFR calc Af Amer: >60 mL/min (ref >60)
GFR calc non Af Amer: >60 mL/min (ref >60)
Glucose, Bld: 139 mg/dL — ABNORMAL HIGH (ref 70–99)
Potassium: 4.4 mmol/L (ref 3.5–5.1)
Sodium: 138 mmol/L (ref 135–145)
Total Bilirubin: 1.6 mg/dL — ABNORMAL HIGH (ref 0.3–1.2)
Total Protein: 6.5 g/dL (ref 6.5–8.1)

## 2019-06-22 LAB — HEMOGLOBIN A1C
Hgb A1c MFr Bld: 6.2 % — ABNORMAL HIGH (ref 4.8–5.6)
Mean Plasma Glucose: 131.24 mg/dL

## 2019-06-22 LAB — HEPATITIS C VRS RNA DETECT BY PCR-QUAL: Hepatitis C Vrs RNA by PCR-Qual: NEGATIVE

## 2019-06-22 LAB — MAGNESIUM: Magnesium: 2 mg/dL (ref 1.7–2.4)

## 2019-06-22 LAB — PHOSPHORUS: Phosphorus: 3.8 mg/dL (ref 2.5–4.6)

## 2019-06-22 LAB — HEPATITIS B E ANTIBODY: Hep B E Ab: NEGATIVE

## 2019-06-22 SURGERY — ENDOSCOPIC RETROGRADE CHOLANGIOPANCREATOGRAPHY (ERCP) WITH PROPOFOL
Anesthesia: General

## 2019-06-22 MED ORDER — ONDANSETRON HCL 4 MG/2ML IJ SOLN
INTRAMUSCULAR | Status: AC
  Filled 2019-06-22: qty 2

## 2019-06-22 MED ORDER — FENTANYL CITRATE (PF) 100 MCG/2ML IJ SOLN
INTRAMUSCULAR | Status: DC | PRN
  Administered 2019-06-22: 50 ug via INTRAVENOUS

## 2019-06-22 MED ORDER — INDOMETHACIN 50 MG RE SUPP
RECTAL | Status: AC
  Filled 2019-06-22: qty 2

## 2019-06-22 MED ORDER — LIDOCAINE HCL (CARDIAC) PF 100 MG/5ML IV SOSY
PREFILLED_SYRINGE | INTRAVENOUS | Status: DC | PRN
  Administered 2019-06-22: 100 mg via INTRAVENOUS

## 2019-06-22 MED ORDER — FENTANYL CITRATE (PF) 100 MCG/2ML IJ SOLN
INTRAMUSCULAR | Status: AC
  Filled 2019-06-22: qty 2

## 2019-06-22 MED ORDER — SUCCINYLCHOLINE CHLORIDE 20 MG/ML IJ SOLN
INTRAMUSCULAR | Status: DC | PRN
  Administered 2019-06-22: 100 mg via INTRAVENOUS

## 2019-06-22 MED ORDER — INDOMETHACIN 50 MG RE SUPP
100.0000 mg | Freq: Once | RECTAL | Status: AC
  Administered 2019-06-22: 15:00:00 100 mg via RECTAL

## 2019-06-22 MED ORDER — LACTATED RINGERS IV SOLN
INTRAVENOUS | Status: DC
  Administered 2019-06-22: 1000 mL via INTRAVENOUS

## 2019-06-22 MED ORDER — PROPOFOL 10 MG/ML IV BOLUS
INTRAVENOUS | Status: DC | PRN
  Administered 2019-06-22: 120 mg via INTRAVENOUS

## 2019-06-22 NOTE — Op Note (Signed)
[facility] Patient Name: Christie Carpenter Procedure Date: 06/22/2019 2:24 PM MRN: [id number] Account #: [id number] Date of Birth: 12/06/1945 Admit Type: Inpatient Age: 74 Room: [facility] ENDO ROOM 4 Gender: Female Note Status: Finalized Procedure:             ERCP Indications:           Common bile duct stone(s) Providers:             Lucilla Lame MD, MD Referring MD:          Remus Blake MD, MD (Referring MD) Medicines:             General Anesthesia Complications:         No immediate complications. Procedure:             Pre-Anesthesia Assessment:                        - Prior to the procedure, a History and Physical was                         performed, and patient medications and allergies were                         reviewed. The patient's tolerance of previous                         anesthesia was also reviewed. The risks and benefits                         of the procedure and the sedation options and risks                         were discussed with the patient. All questions were                         answered, and informed consent was obtained. Prior                         Anticoagulants: The patient has taken no previous                         anticoagulant or antiplatelet agents. ASA Grade                         Assessment: II - A patient with mild systemic disease.                         After reviewing the risks and benefits, the patient                         was deemed in satisfactory condition to undergo the                         procedure.                        After obtaining informed consent, the scope was passed  under direct vision. Throughout the procedure, the                         patient's blood pressure, pulse, and oxygen                         saturations were monitored continuously. The                         Duodenoscope was introduced through the mouth, and      used to inject contrast into and used to cannulate the                         bile duct. The ERCP was accomplished without                         difficulty. The patient tolerated the procedure well. Findings:      A scout film of the abdomen was obtained. Surgical clips, consistent       with previous cholecystectomy, were seen in the area of the cystic duct.       A standard esophagogastroduodenoscopy scope was used for the examination       of the upper gastrointestinal tract. The scope was passed under direct       vision through the upper GI tract. A benign-appearing, intrinsic       moderate stenosis was found at the pylorus. This was traversed. A TTS       dilator was passed through the scope. Dilation with a 15-16.5-18 mm       balloon dilator was performed to 18 mm at the pylorus. The bile duct was       deeply cannulated with the short-nosed traction sphincterotome. Contrast       was injected. I personally interpreted the bile duct images. There was       brisk flow of contrast through the ducts. Image quality was excellent.       Contrast extended to the entire biliary tree. The upper third of the       main bile duct contained one stone. A wire was passed into the biliary       tree. An 8 mm biliary sphincterotomy was made with a traction (standard)       sphincterotome using ERBE electrocautery. There was no       post-sphincterotomy bleeding. The biliary tree was swept with a 15 mm       balloon starting at the bifurcation. One stone was removed. No stones       remained. Impression:            - Gastric stenosis was found at the pylorus.                        - Dilation performed at the pylorus.                        - Choledocholithiasis was found. Complete removal was                         accomplished by biliary sphincterotomy and balloon  extraction.                        - A biliary sphincterotomy was performed.                         - The biliary tree was swept. Recommendation:        - Return patient to hospital ward for ongoing care.                        - Continue present medications.                        - Watch for pancreatitis, bleeding, perforation, and                         cholangitis.                        - Clear liquid diet today. Procedure Code(s):     --- Professional ---                        [phone], Endoscopic retrograde cholangiopancreatography                         (ERCP); with removal of calculi/debris from                         biliary/pancreatic duct(s)                        43262, Endoscopic retrograde cholangiopancreatography                         (ERCP); with sphincterotomy/papillotomy                        43245, Esophagogastroduodenoscopy, flexible,                         transoral; with dilation of gastric/duodenal                         stricture(s) (eg, balloon, bougie)                        [id number], Endoscopic catheterization of the biliary                         ductal system, radiological supervision and                         interpretation Diagnosis Code(s):     --- Professional ---                        K80.50, Calculus of bile duct without cholangitis or                         cholecystitis without obstruction                        K31.1, Adult hypertrophic pyloric stenosis CPT copyright 2019 American [employer] Association.  All rights reserved. The codes documented in this report are preliminary and upon coder review may  be revised to meet current compliance requirements. Lucilla Lame MD, MD 06/22/2019 3:31:02 PM This report has been signed electronically. Number of Addenda: 0 Note Initiated On: 06/22/2019 2:24 PM Estimated Blood Loss:  Estimated blood loss: none.      [facility]

## 2019-06-22 NOTE — Progress Notes (Addendum)
[place] Hospital Day(s): 3.   Post op day(s): 1 Day Post-Op.   Interval History:  Patient seen and examined no acute events or new complaints overnight.  Patient reports she is feeling better this morning No abdominal pain, nausea, emesis, or fever Hyperbilirubinemia nearly resolved - 1.6 today Plan for ERCP today with Dr Allen Norris   Vital signs in last 24 hours: [min-max] current  Temp:  [97.7 F (36.5 C)-98.5 F (36.9 C)] 97.9 F (36.6 C) (03/11 0532) Pulse Rate:  [65-93] 77 (03/11 0532) Resp:  [10-25] 20 (03/11 0532) BP: (101-169)/(61-80) 101/61 (03/11 0532) SpO2:  [98 %-100 %] 98 % (03/11 0532) FiO2 (%):  [28 %] 28 % (03/10 1601) Weight:  [81 kg] 81 kg (03/10 1116)     Height: 5\' 5"  (165.1 cm) Weight: 81 kg BMI (Calculated): 29.72   Intake/Output last 2 shifts:  03/10 0701 - 03/11 0700 In: 1723.8 [I.V.:1293.8; IV Piggyback:430] Out: [id number] [Urine:1500; Blood:5]   Physical Exam:  Constitutional: alert, cooperative and no distress  Respiratory: breathing non-labored at rest  Cardiovascular: regular rate and sinus rhythm  Gastrointestinal: soft, non-tender, and non-distended, no rebound/guarding Integumentary: laparoscopic incisions are CDI with dermabond, no erythema or drainage  Labs:  CBC Latest Ref Rng & Units 06/22/2019 06/21/2019 06/19/2019  WBC 4.0 - 10.5 K/uL 8.4 5.7 6.9  Hemoglobin 12.0 - 15.0 g/dL 11.2(L) 10.8(L) 13.7  Hematocrit 36.0 - 46.0 % 35.8(L) 34.1(L) 42.1  Platelets 150 - 400 K/uL 203 178 217   CMP Latest Ref Rng & Units 06/22/2019 06/21/2019 06/20/2019  Glucose 70 - 99 mg/dL 139(H) 103(H) 116(H)  BUN 8 - 23 mg/dL 14 10 11   Creatinine 0.44 - 1.00 mg/dL 0.77 0.76 0.73  Sodium 135 - 145 mmol/L 138 141 141  Potassium 3.5 - 5.1 mmol/L 4.4 3.8 4.0  Chloride 98 - 111 mmol/L 105 107 105  CO2 22 - 32 mmol/L 25 31 28   Calcium 8.9 - 10.3 mg/dL 8.8(L) 8.9 9.1  Total Protein 6.5 - 8.1 g/dL 6.5 6.0(L) 6.7  Total Bilirubin  0.3 - 1.2 mg/dL 1.6(H) 2.2(H) 4.2(H)  Alkaline Phos 38 - 126 U/L 119 128(H) 159(H)  AST 15 - 41 U/L 211(H) 340(H) 847(H)  ALT 0 - 44 U/L 407(H) 542(H) 914(H)     Imaging studies: No new pertinent imaging studies   Assessment/Plan:  74 y.o. female with improving hyperbilirubinemia likely secondary to retained CBD stone 1 Day Post-Op s/p laparoscopic cholecystectomy with IOC for cholelithiasis without cholecystitis in the setting of hyperbilirubinemia.    - NPO for procedure; okay to resume CLD and ADAT after  - Plan for ERCP this afternoon with Dr Allen Norris   - Continue IV ABx (Zosyn)  - pain control prn; antiemetics prn  - monitor abdominal examination; on-going bowel function    - mobilization encouraged  - further management per primary team   All of the above findings and recommendations were discussed with the patient, and the medical team, and all of patient's questions were answered to her expressed satisfaction.  -- Edison Simon, PA-C [facility] 06/22/2019, 8:09 AM [phone] M-F: 7am - 4pm  I saw and evaluated the patient.  I agree with the above documentation, exam, and plan, which I have edited where appropriate. Fredirick Maudlin  1:30 PM

## 2019-06-22 NOTE — Progress Notes (Signed)
PROGRESS NOTE    Christie Carpenter  EZM:[id number] DOB: 27-Feb-1946 DOA: 06/19/2019 PCP: Ellamae Sia, MD   Brief Narrative:  Christie Carpenter 73 y.o.femalewith medical history significant fornon-Hodgkin's lymphoma,CHF,coronary artery disease,diabetes mellitus,S/p mitral valve repair,hypothyroidism,history of atrial fibrillation status post pacemaker/AICD insertionwho presents to the emergency room for evaluation of abdominal pain which started in her LLQ and radiated tothe right upper quadrant. Pain started several hours after Christie Carpenter meal. Sherated her painan 8 x 10 in intensity at its worst.Pain has no relieving or aggravating factors.Patient said she has had episodes of pain like this in the past but not so intense.Abdominal pain has been associated with nausea,vomiting and diaphoresis. She denies having any changesinher bowel habits.She denies having any chest pain,shortness of breath,palpitations,fever,chills,tinnitus or lightheadedness.Labs revealed elevated liver enzymes in the 1000/s,with elevated total bilirubin and normal alkaline phosphatase level.CT scan of the abdomen and pelvis showed gallbladder wall thickening and cholelithiasis consistent with early cholecystitis.Gallbladder ultrasound showed cholelithiasis  The emergency room for evaluation of right upper quadrant quadrant pain,labs indicated significant transaminitis and elevated lipase suspicious for choledocholithiasis.Bilirubin was elevated at 3.1 and CT scan wassuggestive of cholelithiasis  [facility] have been consulted to admit the patient for further evaluation and treatment. General surgery, cardiology, and gastroenterology have been consulted. Surgery will have to wait 48 hours as the patient was on Eliquis upon presentation. Depending on the results of IOC, GI will then take the patient for ERCP. Cardiology has been consulted and echocardiogram ordered for pre-op  clearance.  Assessment & Plan:   Principal Problem:   Cholelithiasis with acute cholecystitis Active Problems:   CHF (congestive heart failure) (HCC)   Hypothyroidism   CAD (coronary artery disease)   Calculus of gallbladder with acute cholecystitis   AF (paroxysmal atrial fibrillation) (HCC)  Cholelithiasis with acutecholecystitis  Elevated Liver Enzyems: Patient presented for evaluation of right upper quadrant pain associated with nausea and vomiting,labs revealed markedly elevated transaminase levels and elevated total bilirubin levels as well. CT scan of abdomen and pelvisissuggestive of early acute cholecystitis. RUQ [place] with cholelithiasis with boderline gallbladder wall thicknening, indeterminate for acute cholecystitis LFT's improving S/p cholecystectomy 3/10 with IOC which showed common bile duct stones GI is following,plan for ERCP 3/11 (today)     NPO at midnight Continue zosyn for abx (3/8 - present).  Blood cx NGTD.  Continue to hold anticoagulation.  Crackles on lung exam: suspect atelectasis, continue IS, follow  Hypothyroidism: Continue Synthroid  CAD/Chronic diastolic dysfunction CHF: Echocardiogram demonstrated EF of 50-35% with no diastolic dysfunction or RV dysfunction. Monitor the patient for volume status.  Continuemetoprolol,lisinopril and spironolactone.  Cardiology has been consulted for pre-op clearance from Rasheda Ledger cardiac standpoint. Appreciate assistance  Paroxysmal atrial fibrillation: Pt has Bi-v Pacer.  Eliquis currently on hold  Resume as soon as safely possible post op per cardiology  S/p Pacemaker Placement: pacemaker reprogrammed to DOO prior to surgery per cards  T2DM: diet controlled, a1c 6.2, not on any meds  DVT prophylaxis: SCD Code Status:full  Family Communication: none at bedside - offered to update family, she's planning to update them 3/11 Disposition Plan:  . Patient came from: home           . Anticipated d/c place:  home . Barriers to d/c OR conditions which need to be met to effect Markies Mowatt safe d/c: pending ERCP by GI.  S/p cholecystectomy today by surgery.  Pending clearance by GI/surgery.  Consultants:   GI  Surgery  Cardiology  Procedures:  Echo IMPRESSIONS  1. Left ventricular ejection fraction, by estimation, is 50 to 55%. The  left ventricle has low normal function. The left ventricle has no regional  wall motion abnormalities. There is mild left ventricular hypertrophy.  Left ventricular diastolic  parameters were normal.  2. Right ventricular systolic function is normal. The right ventricular  size is normal. There is mildly elevated pulmonary artery systolic  pressure.  3. The mitral valve is normal in structure. Mild to moderate mitral valve  regurgitation. No evidence of mitral stenosis.  4. The aortic valve is normal in structure. Aortic valve regurgitation is  mild to moderate. Moderate aortic valve stenosis.  5. The inferior vena cava is normal in size with greater than 50%  respiratory variability, suggesting right atrial pressure of 3 mmHg.   3/10 lap chole, surgery  Antimicrobials:  Anti-infectives (From admission, onward)   Start     Dose/Rate Route Frequency Ordered Stop   06/19/19 2200  [MAR Hold]  piperacillin-tazobactam (ZOSYN) IVPB 3.375 g     (MAR Hold since Thu 06/22/2019 at 1344.Hold Reason: Transfer to Christie Carpenter Procedural area.)   3.375 g 12.5 mL/hr over 240 Minutes Intravenous Every 8 hours 06/19/19 1622     06/19/19 1400  piperacillin-tazobactam (ZOSYN) IVPB 3.375 g     3.375 g 100 mL/hr over 30 Minutes Intravenous  Once 06/19/19 1348 06/19/19 1427     Subjective: No new complaints today Awaiting ERCP No notable abdominal discomfort, tolerated meal yesterday  Objective: Vitals:   06/22/19 0532 06/22/19 0939 06/22/19 1216 06/22/19 1350  BP: 101/61 110/75 121/62 130/62  Pulse: 77 72 70 70  Resp: '20  16 18  ' Temp: 97.9 F (36.6 C)  97.9 F (36.6 C) (!)  97.2 F (36.2 C)  TempSrc: Oral  Oral Oral  SpO2: 98% 100% 100% 100%  Weight:      Height:        Intake/Output Summary (Last 24 hours) at 06/22/2019 1411 Last data filed at 06/22/2019 1100 Gross per 24 hour  Intake 2249.25 ml  Output 1105 ml  Net 1144.25 ml   Filed Weights   06/19/19 0917 06/21/19 1116  Weight: 81.6 kg 81 kg    Examination:  General: No acute distress. Cardiovascular: Heart sounds show Jessiah Steinhart regular rate, and rhythm Lungs: Clear to auscultation bilaterally, crackles at bases Abdomen: Soft, nontender, nondistended.  Laparoscopic incisions, c/d/i. Neurological: Alert and oriented 3. Moves all extremities 4 with equal strength. Cranial nerves II through XII grossly intact. Skin: Warm and dry. No rashes or lesions. Extremities: No clubbing or cyanosis. No edema.    Data Reviewed: I have personally reviewed following labs and imaging studies  CBC: Recent Labs  Lab 06/19/19 0955 06/21/19 0501 06/22/19 0430  WBC 6.9 5.7 8.4  NEUTROABS 4.7  --   --   HGB 13.7 10.8* 11.2*  HCT 42.1 34.1* 35.8*  MCV 101.2* 102.7* 104.1*  PLT 217 178 832   Basic Metabolic Panel: Recent Labs  Lab 06/19/19 1131 06/20/19 0455 06/21/19 0501 06/22/19 0430  NA 137 141 141 138  K 4.2 4.0 3.8 4.4  CL 101 105 107 105  CO2 '28 28 31 25  ' GLUCOSE 120* 116* 103* 139*  BUN '17 11 10 14  ' CREATININE 0.70 0.73 0.76 0.77  CALCIUM 9.4 9.1 8.9 8.8*  MG  --   --   --  2.0  PHOS  --   --   --  3.8   GFR: Estimated Creatinine Clearance: 64.9 mL/min (by C-G formula  based on SCr of 0.77 mg/dL). Liver Function Tests: Recent Labs  Lab 06/19/19 1131 06/20/19 0455 06/21/19 0501 06/22/19 0430  AST 1,625* 847* 340* 211*  ALT 1,057* 914* 542* 407*  ALKPHOS 121 159* 128* 119  BILITOT 3.1* 4.2* 2.2* 1.6*  PROT 7.2 6.7 6.0* 6.5  ALBUMIN 4.0 3.7 3.3* 3.4*   Recent Labs  Lab 06/19/19 1131  LIPASE 167*   No results for input(s): AMMONIA in the last 168 hours. Coagulation  Profile: Recent Labs  Lab 06/19/19 1547 06/20/19 0455  INR 0.9 1.0   Cardiac Enzymes: Recent Labs  Lab 06/19/19 1547  CKTOTAL 60   BNP (last 3 results) No results for input(s): PROBNP in the last 8760 hours. HbA1C: Recent Labs    06/22/19 0430  HGBA1C 6.2*   CBG: No results for input(s): GLUCAP in the last 168 hours. Lipid Profile: No results for input(s): CHOL, HDL, LDLCALC, TRIG, CHOLHDL, LDLDIRECT in the last 72 hours. Thyroid Function Tests: No results for input(s): TSH, T4TOTAL, FREET4, T3FREE, THYROIDAB in the last 72 hours. Anemia Panel: No results for input(s): VITAMINB12, FOLATE, FERRITIN, TIBC, IRON, RETICCTPCT in the last 72 hours. Sepsis Labs: No results for input(s): PROCALCITON, LATICACIDVEN in the last 168 hours.  Recent Results (from the past 240 hour(s))  Respiratory Panel by RT PCR (Flu Chasmine Lender&B, Covid) - Nasopharyngeal Swab     Status: None   Collection Time: 06/19/19  4:48 PM   Specimen: Nasopharyngeal Swab  Result Value Ref Range Status   SARS Coronavirus 2 by RT PCR NEGATIVE NEGATIVE Final    Comment: (NOTE) SARS-CoV-2 target nucleic acids are NOT DETECTED. The SARS-CoV-2 RNA is generally detectable in upper respiratoy specimens during the acute phase of infection. The lowest concentration of SARS-CoV-2 viral copies this assay can detect is 131 copies/mL. Marjani Kobel negative result does not preclude SARS-Cov-2 infection and should not be used as the sole basis for treatment or other patient management decisions. Jerimyah Vandunk negative result may occur with  improper specimen collection/handling, submission of specimen other than nasopharyngeal swab, presence of viral mutation(s) within the areas targeted by this assay, and inadequate number of viral copies (<131 copies/mL). Camara Renstrom negative result must be combined with clinical observations, patient history, and epidemiological information. The expected result is Negative. Fact Sheet for Patients:   PinkCheek.be Fact Sheet for Healthcare Providers:  GravelBags.it This test is not yet ap proved or cleared by the [place] FDA and  has been authorized for detection and/or diagnosis of SARS-CoV-2 by FDA under an Emergency Use Authorization (EUA). This EUA will remain  in effect (meaning this test can be used) for the duration of the COVID-19 declaration under Section 564(b)(1) of the Act, 21 U.S.C. section 360bbb-3(b)(1), unless the authorization is terminated or revoked sooner.    Influenza Madalin Hughart by PCR NEGATIVE NEGATIVE Final   Influenza B by PCR NEGATIVE NEGATIVE Final    Comment: (NOTE) The Xpert Xpress SARS-CoV-2/FLU/RSV assay is intended as an aid in  the diagnosis of influenza from Nasopharyngeal swab specimens and  should not be used as Cincere Zorn sole basis for treatment. Nasal washings and  aspirates are unacceptable for Xpert Xpress SARS-CoV-2/FLU/RSV  testing. Fact Sheet for Patients: PinkCheek.be Fact Sheet for Healthcare Providers: GravelBags.it This test is not yet approved or cleared by the [place] FDA and  has been authorized for detection and/or diagnosis of SARS-CoV-2 by  FDA under an Emergency Use Authorization (EUA). This EUA will remain  in effect (meaning this test can be used)  for the duration of the  Covid-19 declaration under Section 564(b)(1) of the Act, 21  U.S.C. section 360bbb-3(b)(1), unless the authorization is  terminated or revoked. Performed at [facility], [address] [place], [place] [address]   CULTURE, BLOOD (ROUTINE X 2) w Reflex to ID Panel     Status: None (Preliminary result)   Collection Time: 06/20/19  2:57 PM   Specimen: BLOOD  Result Value Ref Range Status   Specimen Description BLOOD BLOOD LEFT HAND  Final   Special Requests   Final    BOTTLES DRAWN AEROBIC ONLY Blood Culture results may not be optimal  due to an inadequate volume of blood received in culture bottles   Culture   Final    NO GROWTH 2 DAYS Performed at [facility], [address], [place], [place] [address]    Report Status PENDING  Incomplete  CULTURE, BLOOD (ROUTINE X 2) w Reflex to ID Panel     Status: None (Preliminary result)   Collection Time: 06/20/19  2:57 PM   Specimen: BLOOD  Result Value Ref Range Status   Specimen Description BLOOD BLOOD LEFT HAND  Final   Special Requests   Final    BOTTLES DRAWN AEROBIC ONLY Blood Culture results may not be optimal due to an inadequate volume of blood received in culture bottles   Culture   Final    NO GROWTH 2 DAYS Performed at [facility], [address] [place], [place] [address]    Report Status PENDING  Incomplete         Radiology Studies: DG Cholangiogram Operative  Result Date: 06/21/2019 CLINICAL DATA:  Intraoperative cholangiogram during laparoscopic cholecystectomy. EXAM: INTRAOPERATIVE CHOLANGIOGRAM FLUOROSCOPY TIME:  13 seconds COMPARISON:  Right upper quadrant from abdominal ultrasound - 06/19/2019; CT abdomen and pelvis - 06/19/2019 FINDINGS: Intraoperative cholangiographic images of the right upper abdominal quadrant during laparoscopic cholecystectomy are provided for review. Surgical clips overlie the expected location of the gallbladder fossa. Contrast injection demonstrates selective cannulation of the central aspect of the cystic duct. There is passage of contrast through the central aspect of the cystic duct with filling of Mikesha Migliaccio non dilated common bile duct. There is passage of contrast though the CBD and into the descending portion of the duodenum. There is minimal reflux of injected contrast into the common hepatic duct and central aspect of the non dilated intrahepatic biliary system. There is Saniyah Mondesir persistent nonocclusive filling defect in the distal aspect of the CBD worrisome for choledocholithiasis. IMPRESSION: Persistent  nonocclusive filling defect within the distal aspect of the CBD worrisome for choledocholithiasis. Electronically Signed   By: Sandi Mariscal M.D.   On: 06/21/2019 16:23        Scheduled Meds: . indomethacin  100 mg Rectal Once  . indomethacin      . [MAR Hold] levothyroxine  112 mcg Oral q morning - 10a  . [MAR Hold] lisinopril  2.5 mg Oral Daily  . [MAR Hold] metoprolol succinate  25 mg Oral Daily  . [MAR Hold] multivitamin with minerals  1 tablet Oral Daily  . [MAR Hold] spironolactone  12.5 mg Oral Daily   Continuous Infusions: . sodium chloride 50 mL/hr at 06/22/19 1100  . lactated ringers 1,000 mL (06/22/19 1405)  . [MAR Hold] piperacillin-tazobactam (ZOSYN)  IV 3.375 g (06/22/19 1400)     LOS: 3 days    Time spent: over 30 min    Fayrene Helper, MD [facility]   To contact the  attending provider between 7A-7P or the covering provider during after hours 7P-7A, please log into the web site [email or web address] and access using universal [place] password for that web site. If you do not have the password, please call the hospital operator.  06/22/2019, 2:11 PM

## 2019-06-22 NOTE — Anesthesia Procedure Notes (Signed)
Procedure Name: Intubation Date/Time: 06/22/2019 2:54 PM Performed by: Hedda Slade, CRNA Pre-anesthesia Checklist: Patient identified, Patient being monitored, Timeout performed, Emergency Drugs available and Suction available Patient Re-evaluated:Patient Re-evaluated prior to induction Oxygen Delivery Method: Circle system utilized Preoxygenation: Pre-oxygenation with 100% oxygen Induction Type: IV induction Ventilation: Mask ventilation without difficulty Laryngoscope Size: 3 and McGraph Grade View: Grade I Tube type: Oral Tube size: 7.0 mm Number of attempts: 1 Airway Equipment and Method: Stylet and Video-laryngoscopy Placement Confirmation: ETT inserted through vocal cords under direct vision,  positive ETCO2 and breath sounds checked- equal and bilateral Secured at: 21 cm Tube secured with: Tape Dental Injury: Teeth and Oropharynx as per pre-operative assessment

## 2019-06-22 NOTE — Care Management Important Message (Signed)
Important Message  Patient Details  Name: Christie Carpenter MRN: [id number] Date of Birth: Apr 06, 1946   Medicare Important Message Given:  Yes     Dannette Barbara 06/22/2019, 12:52 PM

## 2019-06-22 NOTE — Transfer of Care (Signed)
Immediate Anesthesia Transfer of Care Note  Patient: Christie Carpenter  Procedure(s) Performed: ENDOSCOPIC RETROGRADE CHOLANGIOPANCREATOGRAPHY (ERCP) WITH PROPOFOL (N/A )  Patient Location: PACU  Anesthesia Type:General  Level of Consciousness: sedated  Airway & Oxygen Therapy: Patient Spontanous Breathing and Patient connected to face mask oxygen  Post-op Assessment: Report given to RN and Post -op Vital signs reviewed and stable  Post vital signs: Reviewed and stable  Last Vitals:  Vitals Value Taken Time  BP    Temp    Pulse 95 06/22/19 1539  Resp 17 06/22/19 1539  SpO2 100 % 06/22/19 1539  Vitals shown include unvalidated device data.  Last Pain:  Vitals:   06/22/19 1350  TempSrc: Oral  PainSc: 2       Patients Stated Pain Goal: 0 ([id number])  Complications: No apparent anesthesia complications

## 2019-06-22 NOTE — Anesthesia Preprocedure Evaluation (Signed)
Anesthesia Evaluation  Patient identified by MRN, date of birth, ID band Patient awake    Reviewed: Allergy & Precautions, H&P , NPO status , Patient's Chart, lab work & pertinent test results  Airway Mallampati: II  TM Distance: >3 FB Neck ROM: full    Dental  (+) Partial Upper   Pulmonary shortness of breath, former smoker,  Pulmonary hypertension - mild on echo 06/20/19          Cardiovascular + CAD and +CHF  + pacemaker (pacemaker dependent.  H/o CHB) + Valvular Problems/Murmurs (s/p mitral valve repair)  Rhythm:regular Rate:Normal  Pacemaker interrogated and re-programmed prior to procedure  Echo 06/20/19  1. Left ventricular ejection fraction, by estimation, is 50 to 55%. The  left ventricle has low normal function. The left ventricle has no regional  wall motion abnormalities. There is mild left ventricular hypertrophy.  Left ventricular diastolic  parameters were normal.  2. Right ventricular systolic function is normal. The right ventricular  size is normal. There is mildly elevated pulmonary artery systolic  pressure.  3. The mitral valve is normal in structure. Mild to moderate mitral valve  regurgitation. No evidence of mitral stenosis.  4. The aortic valve is normal in structure. Aortic valve regurgitation is  mild to moderate. Moderate aortic valve stenosis.  5. The inferior vena cava is normal in size with greater than 50%  respiratory variability, suggesting right atrial pressure of 3 mmHg.    Neuro/Psych PSYCHIATRIC DISORDERS Depression negative neurological ROS     GI/Hepatic negative GI ROS, Neg liver ROS,   Endo/Other  diabetesHypothyroidism   Renal/GU      Musculoskeletal   Abdominal   Peds  Hematology negative hematology ROS (+)   Anesthesia Other Findings Past Medical History: 1987: Cancer ([place])     Comment:  non hodgkins lymphoma No date: CHF (congestive heart failure) (HCC) No  date: Coronary artery disease No date: Depression No date: Diabetes mellitus without complication (HCC) No date: Heart murmur No date: Hypothyroidism No date: Mitral valve problem No date: Presence of permanent cardiac pacemaker No date: Pulmonary hypertension (HCC) No date: Shortness of breath dyspnea  Past Surgical History: No date: PACEMAKER INSERTION  BMI    Body Mass Index: 29.95 kg/m      Reproductive/Obstetrics negative OB ROS                             Anesthesia Physical  Anesthesia Plan  ASA: III  Anesthesia Plan: General ETT   Post-op Pain Management:    Induction:   PONV Risk Score and Plan: Ondansetron, Dexamethasone and Treatment may vary due to age or medical condition  Airway Management Planned:   Additional Equipment:   Intra-op Plan:   Post-operative Plan:   Informed Consent: I have reviewed the patients History and Physical, chart, labs and discussed the procedure including the risks, benefits and alternatives for the proposed anesthesia with the patient or authorized representative who has indicated his/her understanding and acceptance.     Dental Advisory Given  Plan Discussed with: Anesthesiologist  Anesthesia Plan Comments:         Anesthesia Quick Evaluation

## 2019-06-23 ENCOUNTER — Encounter: Payer: Self-pay | Admitting: *Deleted

## 2019-06-23 LAB — CBC WITH DIFFERENTIAL/PLATELET
Abs Immature Granulocytes: 0.06 10*3/uL (ref 0.00–0.07)
Basophils Absolute: 0 10*3/uL (ref 0.0–0.1)
Basophils Relative: 0 %
Eosinophils Absolute: 0.2 10*3/uL (ref 0.0–0.5)
Eosinophils Relative: 3 %
HCT: 31.9 % — ABNORMAL LOW (ref 36.0–46.0)
Hemoglobin: 10.1 g/dL — ABNORMAL LOW (ref 12.0–15.0)
Immature Granulocytes: 1 %
Lymphocytes Relative: 27 %
Lymphs Abs: 2.4 10*3/uL (ref 0.7–4.0)
MCH: 32.6 pg (ref 26.0–34.0)
MCHC: 31.7 g/dL (ref 30.0–36.0)
MCV: 102.9 fL — ABNORMAL HIGH (ref 80.0–100.0)
Monocytes Absolute: 0.9 10*3/uL (ref 0.1–1.0)
Monocytes Relative: 10 %
Neutro Abs: 5.3 10*3/uL (ref 1.7–7.7)
Neutrophils Relative %: 59 %
Platelets: 184 10*3/uL (ref 150–400)
RBC: 3.1 MIL/uL — ABNORMAL LOW (ref 3.87–5.11)
RDW: 14.1 % (ref 11.5–15.5)
WBC: 8.8 10*3/uL (ref 4.0–10.5)
nRBC: 0 % (ref 0.0–0.2)

## 2019-06-23 LAB — PHOSPHORUS: Phosphorus: 2.1 mg/dL — ABNORMAL LOW (ref 2.5–4.6)

## 2019-06-23 LAB — COMPREHENSIVE METABOLIC PANEL
ALT: 265 U/L — ABNORMAL HIGH (ref 0–44)
AST: 120 U/L — ABNORMAL HIGH (ref 15–41)
Albumin: 3.1 g/dL — ABNORMAL LOW (ref 3.5–5.0)
Alkaline Phosphatase: 147 U/L — ABNORMAL HIGH (ref 38–126)
Anion gap: 7 (ref 5–15)
BUN: 31 mg/dL — ABNORMAL HIGH (ref 8–23)
CO2: 25 mmol/L (ref 22–32)
Calcium: 8.6 mg/dL — ABNORMAL LOW (ref 8.9–10.3)
Chloride: 104 mmol/L (ref 98–111)
Creatinine, Ser: 0.77 mg/dL (ref 0.44–1.00)
GFR calc Af Amer: >60 mL/min (ref >60)
GFR calc non Af Amer: >60 mL/min (ref >60)
Glucose, Bld: 124 mg/dL — ABNORMAL HIGH (ref 70–99)
Potassium: 3.9 mmol/L (ref 3.5–5.1)
Sodium: 136 mmol/L (ref 135–145)
Total Bilirubin: 1.5 mg/dL — ABNORMAL HIGH (ref 0.3–1.2)
Total Protein: 5.8 g/dL — ABNORMAL LOW (ref 6.5–8.1)

## 2019-06-23 LAB — LIPASE, BLOOD: Lipase: 25 U/L (ref 11–51)

## 2019-06-23 LAB — MAGNESIUM: Magnesium: 1.9 mg/dL (ref 1.7–2.4)

## 2019-06-23 LAB — SURGICAL PATHOLOGY

## 2019-06-23 NOTE — Consult Note (Addendum)
Pharmacy Antibiotic Note  Christie Carpenter is a 74 y.o. female admitted on 06/19/2019 with cholelithiasis.  Pharmacy was consulted for Zosyn dosing. This is day #5 of Zosyn. Dr Celine Ahr is planning for a laparoscopic cholecystectomy with IOC today. His renal function is stable with no leukocytosis or recent febrile episodes noted.  Plan: Continue Zosyn  3.375 g Q8H (extended infusion)   Height: 5\' 5"  (165.1 cm) Weight: 178 lb 9.2 oz (81 kg) IBW/kg (Calculated) : 57  Temp (24hrs), Avg:98.3 F (36.8 C), Min:97.2 F (36.2 C), Max:100 F (37.8 C)  Recent Labs  Lab 06/19/19 0955 06/19/19 1131 06/20/19 0455 06/21/19 0501 06/22/19 0430 06/23/19 0332  WBC 6.9  --   --  5.7 8.4 8.8  CREATININE  --  0.70 0.73 0.76 0.77 0.77    Estimated Creatinine Clearance: 64.9 mL/min (by C-G formula based on SCr of 0.77 mg/dL).    Antimicrobials this admission: Zosyn 3/8 >>  Microbiology results: 3/9 BCx: NGTD 3/8 SARS CoV-2 negative 3/8 influenza A/B: negative  Thank you for allowing pharmacy to be a part of this patient's care.  Rowland Lathe 06/23/2019 8:51 AM

## 2019-06-23 NOTE — Anesthesia Postprocedure Evaluation (Signed)
Anesthesia Post Note  Patient: Christie Carpenter  Procedure(s) Performed: LAPAROSCOPIC CHOLECYSTECTOMY WITH INTRAOPERATIVE CHOLANGIOGRAM (N/A Abdomen)  Patient location during evaluation: PACU Anesthesia Type: General Level of consciousness: awake and alert and oriented Pain management: pain level controlled Vital Signs Assessment: post-procedure vital signs reviewed and stable Respiratory status: spontaneous breathing Cardiovascular status: blood pressure returned to baseline Anesthetic complications: no     Last Vitals:  Vitals:   06/23/19 0914 06/23/19 1140  BP: (!) 121/57 140/68  Pulse: 81 75  Resp:  15  Temp:  36.5 C  SpO2: 100% 100%    Last Pain:  Vitals:   06/23/19 1140  TempSrc: Oral  PainSc:                  [name]

## 2019-06-23 NOTE — Progress Notes (Addendum)
[facility] Day(s): 4.   Post op day(s): 1 Day Post-Op.   Interval History:  Patient seen and examined no acute events or new complaints overnight.  Patient reports she is feeling better, reports she is hungry Mild incisional soreness, no fever, chills, nausea, or emesis Hyperbilirubinemia improving s/p ERCP yesterday Mild hypophosphatemia to 2.1 Tolerated CLD, no issues  Vital signs in last 24 hours: [min-max] current  Temp:  [97.2 F (36.2 C)-100 F (37.8 C)] 98 F (36.7 C) (03/12 0413) Pulse Rate:  [69-91] 69 (03/12 0413) Resp:  [7-19] 16 (03/12 0413) BP: (107-161)/(48-82) 107/48 (03/12 0413) SpO2:  [98 %-100 %] 98 % (03/12 0413)     Height: 5\' 5"  (165.1 cm) Weight: 81 kg BMI (Calculated): 29.72   Intake/Output last 2 shifts:  03/11 0701 - 03/12 0700 In: 625.4 [I.V.:550; IV Piggyback:75.5] Out: -    Physical Exam:  Constitutional: alert, cooperative and no distress  Respiratory: breathing non-labored at rest  Cardiovascular: regular rate and sinus rhythm  Gastrointestinal: soft, non-tender, and non-distended, no rebound/guarding Integumentary: laparoscopic incisions are CDI with steri-strips, no erythema or drainage   Labs:  CBC Latest Ref Rng & Units 06/23/2019 06/22/2019 06/21/2019  WBC 4.0 - 10.5 K/uL 8.8 8.4 5.7  Hemoglobin 12.0 - 15.0 g/dL 10.1(L) 11.2(L) 10.8(L)  Hematocrit 36.0 - 46.0 % 31.9(L) 35.8(L) 34.1(L)  Platelets 150 - 400 K/uL 184 203 178   CMP Latest Ref Rng & Units 06/23/2019 06/22/2019 06/21/2019  Glucose 70 - 99 mg/dL 124(H) 139(H) 103(H)  BUN 8 - 23 mg/dL 31(H) 14 10  Creatinine 0.44 - 1.00 mg/dL 0.77 0.77 0.76  Sodium 135 - 145 mmol/L 136 138 141  Potassium 3.5 - 5.1 mmol/L 3.9 4.4 3.8  Chloride 98 - 111 mmol/L 104 105 107  CO2 22 - 32 mmol/L 25 25 31   Calcium 8.9 - 10.3 mg/dL 8.6(L) 8.8(L) 8.9  Total Protein 6.5 - 8.1 g/dL 5.8(L) 6.5 6.0(L)  Total Bilirubin 0.3 - 1.2 mg/dL 1.5(H) 1.6(H)  2.2(H)  Alkaline Phos 38 - 126 U/L 147(H) 119 128(H)  AST 15 - 41 U/L 120(H) 211(H) 340(H)  ALT 0 - 44 U/L 265(H) 407(H) 542(H)     Imaging studies: No new pertinent imaging studies   Assessment/Plan:  74 y.o. female overall doing well with improving hyperbilirubinemia 1 Day Post-Op s/p ERCP and 2 days s/p laparoscopic cholecystectomy with IOC for cholelithiasis without cholecystitis in the setting of hyperbilirubinemia.    - Advance diet as tolerates today   - Would stop abx after today's doses.             - pain control prn; antiemetics prn             - monitor abdominal examination; on-going bowel function               - mobilization encouraged             - further management per primary team     - Discharge Planning: Okay for discharge from surgical standpoint if tolerates advancement of diet today, will need general surgery follow up in 2 weeks, Rx for pain control  All of the above findings and recommendations were discussed with the patient, and the medical team, and all of patient's questions were answered to her expressed satisfaction.  -- Edison Simon, PA-C [facility] 06/23/2019, 7:50 AM [phone] M-F: 7am - 4pm  I saw and evaluated the patient.  I agree  with the above documentation, exam, and plan, which I have edited where appropriate. Fredirick Maudlin  9:45 AM

## 2019-06-23 NOTE — Progress Notes (Signed)
Christie Carpenter to be D/C'd home per MD order.  Discussed prescriptions and follow up appointments with the patient. Prescriptions given to patient, medication list explained in detail. Pt verbalized understanding.  Allergies as of 06/23/2019      Reactions   Oxycodone Shortness Of Breath   Bupropion Itching      Medication List    TAKE these medications   apixaban 5 MG Tabs tablet Commonly known as: ELIQUIS Take 5 mg by mouth 2 (two) times daily.   atorvastatin 80 MG tablet Commonly known as: LIPITOR Take 1 tablet by mouth daily.   ezetimibe 10 MG tablet Commonly known as: ZETIA Take 10 mg by mouth daily.   furosemide 20 MG tablet Commonly known as: LASIX Take 20 mg by mouth daily as needed for fluid.   levothyroxine 112 MCG tablet Commonly known as: SYNTHROID Take 112 mcg by mouth every morning.   lisinopril 2.5 MG tablet Commonly known as: ZESTRIL Take 1 tablet by mouth daily.   metoprolol succinate 25 MG 24 hr tablet Commonly known as: TOPROL-XL Take 1 tablet by mouth daily.   multivitamin with minerals Tabs tablet Take 1 tablet by mouth daily.   spironolactone 25 MG tablet Commonly known as: ALDACTONE Take 0.5 tablets by mouth daily.       Vitals:   06/23/19 0914 06/23/19 1140  BP: (!) 121/57 140/68  Pulse: 81 75  Resp:  15  Temp:  97.7 F (36.5 C)  SpO2: 100% 100%    Skin clean, dry and intact without evidence of skin break down, no evidence of skin tears noted. IV catheter discontinued intact. Site without signs and symptoms of complications. Dressing and pressure applied. Pt denies pain at this time. No complaints noted.  An After Visit Summary was printed and given to the patient. Patient escorted via [place], and D/C home via private auto.  Fuller Mandril, RN

## 2019-06-23 NOTE — Plan of Care (Signed)
  Problem: Education: Goal: Knowledge of General Education information will improve Description: Including pain rating scale, medication(s)/side effects and non-pharmacologic comfort measures Outcome: Progressing   Problem: Clinical Measurements: Goal: Will remain free from infection Outcome: Progressing Goal: Respiratory complications will improve Outcome: Progressing   Problem: Activity: Goal: Risk for activity intolerance will decrease Outcome: Progressing   Problem: Nutrition: Goal: Adequate nutrition will be maintained Outcome: Progressing   Problem: Coping: Goal: Level of anxiety will decrease Outcome: Progressing

## 2019-06-23 NOTE — Discharge Summary (Signed)
Physician Discharge Summary  Christie Carpenter [id number] DOB: [birth year] DOA: 06/19/2019  PCP: Christie Sia, MD  Admit date: 06/19/2019 Discharge date: 06/24/2019  Time spent: 40 minutes  Recommendations for Outpatient Follow-up:  1. Follow outpatient CBC/CMP 2. Follow with surgery outpatient    Discharge Diagnoses:  Principal Problem:   Cholelithiasis with acute cholecystitis Active Problems:   CHF (congestive heart failure) (HCC)   Hypothyroidism   CAD (coronary artery disease)   Calculus of gallbladder with acute cholecystitis   AF (paroxysmal atrial fibrillation) (HCC)   Common bile duct calculus   Acquired stricture of pylorus   Discharge Condition: stable  Filed Weights   06/19/19 0917 06/21/19 1116  Weight: 81.6 kg 81 kg    History of present illness:  Christie Carpenter 74 y.o.femalewith medical history significant fornon-Hodgkin's lymphoma,CHF,coronary artery disease,diabetes mellitus,S/p mitral valve repair,hypothyroidism,history of atrial fibrillation status post pacemaker/AICD insertionwho presents to the emergency room for evaluation of abdominal pain which started in her LLQ and radiated tothe right upper quadrant. Pain started several hours after [name] meal. Sherated her painan 8 x 10 in intensity at its worst.Pain has no relieving or aggravating factors.Patient said she has had episodes of pain like this in the past but not so intense.Abdominal pain has been associated with nausea,vomiting and diaphoresis. She denies having any changesinher bowel habits.She denies having any chest pain,shortness of breath,palpitations,fever,chills,tinnitus or lightheadedness.Labs revealed elevated liver enzymes in the 1000/s,with elevated total bilirubin and normal alkaline phosphatase level.CT scan of the abdomen and pelvis showed gallbladder wall thickening and cholelithiasis consistent with early cholecystitis.Gallbladder ultrasound showed  cholelithiasis  The emergency room for evaluation of right upper quadrant quadrant pain,labs indicated significant transaminitis and elevated lipase suspicious for choledocholithiasis.Bilirubin was elevated at 3.1 and CT scan wassuggestive of cholelithiasis  [facility] have been consulted to admit the patient for further evaluation and treatment. General surgery, cardiology, and gastroenterology have been consulted. Surgery will have to wait 48 hours as the patient was on Eliquis upon presentation. Depending on the results of IOC, GI will then take the patient for ERCP. Cardiology has been consulted and echocardiogram ordered for pre-op clearance.  She was seen for cholecystitis and choledocholithiasis.  She was treated with cholecystectomy.  IOC notable for stones.  She had ERCP with GI.  Discharged on 3/12, tolerating [name] diet with minimal pain and improving labs.  Hospital Course:  Cholelithiasis with acutecholecystitis  Elevated Liver Enzyems:Patient presentedfor evaluation of right upper quadrant pain associated with nausea and vomiting,labs revealed markedly elevated transaminase levels and elevated total bilirubin levels as well. CT scan of abdomen and pelvisissuggestive of early acute cholecystitis. RUQ [place] with cholelithiasis with boderline gallbladder wall thicknening, indeterminate for acute cholecystitis LFT's improving S/p cholecystectomy 3/10 with IOC which showed common bile duct stones S/p ERCP 3/11 by GI with biliary sphincterotomy and balloon extraction NPO at midnight Continue zosyn for abx (3/8 - 3/12).  Blood cx NGTD.   Crackles on lung exam: suspect atelectasis, continue IS, follow - improved  Hypothyroidism:Continue Synthroid  CAD/Chronic diastolic dysfunction CHF: Echocardiogram demonstrated EF of [id number] with no diastolic dysfunction or RV dysfunction. Monitor the patient for volume status. Continuemetoprolol,lisinopril and  spironolactone. Cardiology has been consulted for pre-op clearance from Apollo Timothy cardiac standpoint. Appreciate assistance  Paroxysmal atrial fibrillation: Pt has Bi-v Pacer.  Resume eliquis  S/p Pacemaker Placement: pacemaker reprogrammed to DOO prior to surgery per cards  T2DM: diet controlled, a1c 6.2, not on any meds  Procedures: Lap chole with IOC  ERCP -  Gastric stenosis was found at the pylorus. - Dilation performed at the pylorus. - Choledocholithiasis was found. Complete removal was accomplished by biliary sphincterotomy and balloon extraction. - Hisashi Amadon biliary sphincterotomy Erlinda Solinger biliary sphincterotomy was performed. - The biliary tree was swept. - Return patient to hospital ward for ongoing care. - Continue present medications. - Watch for pancreatitis, bleeding, perforation, and cholangitis. - Clear liquid diet today.  Echo IMPRESSIONS    1. Left ventricular ejection fraction, by estimation, is 50 to 55%. The  left ventricle has low normal function. The left ventricle has no regional  wall motion abnormalities. There is mild left ventricular hypertrophy.  Left ventricular diastolic  parameters were normal.  2. Right ventricular systolic function is normal. The right ventricular  size is normal. There is mildly elevated pulmonary artery systolic  pressure.  3. The mitral valve is normal in structure. Mild to moderate mitral valve  regurgitation. No evidence of mitral stenosis.  4. The aortic valve is normal in structure. Aortic valve regurgitation is  mild to moderate. Moderate aortic valve stenosis.  5. The inferior vena cava is normal in size with greater than 50%  respiratory variability, suggesting right atrial pressure of 3 mmHg.   Consultations:  GI  Surgery  Cardiology  Discharge Exam: Vitals:   06/23/19 0914 06/23/19 1140  BP: (!) 121/57 140/68  Pulse: 81 75  Resp:  15  Temp:  97.7 F (36.5 C)  SpO2: 100% 100%   Feels better Comfortable  with d/c Discussed d/c plan  General: No acute distress. Cardiovascular: Heart sounds show Wessley Emert regular rate, and rhythm Lungs: Clear to auscultation bilaterally  Abdomen: Soft, nontender, nondistended Neurological: Alert and oriented 3. Moves all extremities 4 . Cranial nerves II through XII grossly intact. Skin: Warm and dry. No rashes or lesions. Extremities: No clubbing or cyanosis. No edema. Discharge Instructions   Discharge Instructions    Call MD for:  difficulty breathing, headache or visual disturbances   Complete by: As directed    Call MD for:  extreme fatigue   Complete by: As directed    Call MD for:  hives   Complete by: As directed    Call MD for:  persistant dizziness or light-headedness   Complete by: As directed    Call MD for:  persistant nausea and vomiting   Complete by: As directed    Call MD for:  redness, tenderness, or signs of infection (pain, swelling, redness, odor or green/yellow discharge around incision site)   Complete by: As directed    Call MD for:  severe uncontrolled pain   Complete by: As directed    Call MD for:  temperature >100.4   Complete by: As directed    Diet - low sodium heart healthy   Complete by: As directed    Discharge instructions   Complete by: As directed    You were seen for cholecystitis and choledocholithiasis.  You've improved after surgery, ERCP, and antibiotics.  Please follow up with surgery in 2-3 weeks as an outpatient.  Return for new, recurrent, or worsening symptoms.  Please ask your PCP to request records from this hospitalization so they know what was done and what the next steps will be.   Increase activity slowly   Complete by: As directed      Allergies as of 06/23/2019      Reactions   Oxycodone Shortness Of Breath   Bupropion Itching      Medication List    TAKE  these medications   apixaban 5 MG Tabs tablet Commonly known as: ELIQUIS Take 5 mg by mouth 2 (two) times daily.    atorvastatin 80 MG tablet Commonly known as: LIPITOR Take 1 tablet by mouth daily.   ezetimibe 10 MG tablet Commonly known as: ZETIA Take 10 mg by mouth daily.   furosemide 20 MG tablet Commonly known as: LASIX Take 20 mg by mouth daily as needed for fluid.   levothyroxine 112 MCG tablet Commonly known as: SYNTHROID Take 112 mcg by mouth every morning.   lisinopril 2.5 MG tablet Commonly known as: ZESTRIL Take 1 tablet by mouth daily.   metoprolol succinate 25 MG 24 hr tablet Commonly known as: TOPROL-XL Take 1 tablet by mouth daily.   multivitamin with minerals Tabs tablet Take 1 tablet by mouth daily.   spironolactone 25 MG tablet Commonly known as: ALDACTONE Take 0.5 tablets by mouth daily.      Allergies  Allergen Reactions  . Oxycodone Shortness Of Breath  . Bupropion Itching   Follow-up Information    Tylene Fantasia, PA-C. Schedule an appointment as soon as possible for Kathyleen Radice visit in 2 week(s).   Specialty: Physician Assistant Why: s/p lap chole Contact information: [address] [place] [address] [phone]            The results of significant diagnostics from this hospitalization (including imaging, microbiology, ancillary and laboratory) are listed below for reference.    Significant Diagnostic Studies: DG Cholangiogram Operative  Result Date: 06/21/2019 CLINICAL DATA:  Intraoperative cholangiogram during laparoscopic cholecystectomy. EXAM: INTRAOPERATIVE CHOLANGIOGRAM FLUOROSCOPY TIME:  13 seconds COMPARISON:  Right upper quadrant from abdominal ultrasound - 06/19/2019; CT abdomen and pelvis - 06/19/2019 FINDINGS: Intraoperative cholangiographic images of the right upper abdominal quadrant during laparoscopic cholecystectomy are provided for review. Surgical clips overlie the expected location of the gallbladder fossa. Contrast injection demonstrates selective cannulation of the central aspect of the cystic duct. There is passage  of contrast through the central aspect of the cystic duct with filling of Ahlayah Tarkowski non dilated common bile duct. There is passage of contrast though the CBD and into the descending portion of the duodenum. There is minimal reflux of injected contrast into the common hepatic duct and central aspect of the non dilated intrahepatic biliary system. There is Carlito Bogert persistent nonocclusive filling defect in the distal aspect of the CBD worrisome for choledocholithiasis. IMPRESSION: Persistent nonocclusive filling defect within the distal aspect of the CBD worrisome for choledocholithiasis. Electronically Signed   By: Sandi Mariscal M.D.   On: 06/21/2019 16:23   CT ABDOMEN PELVIS W CONTRAST  Result Date: 06/19/2019 CLINICAL DATA:  Abdominal pain beginning last night with nausea. History of non-Hodgkin's lymphoma. EXAM: CT ABDOMEN AND PELVIS WITH CONTRAST TECHNIQUE: Multidetector CT imaging of the abdomen and pelvis was performed using the standard protocol following bolus administration of intravenous contrast. CONTRAST:  131mL OMNIPAQUE IOHEXOL 300 MG/ML  SOLN COMPARISON:  07/20/2016 FINDINGS: Lower chest: No acute abnormality. Hepatobiliary: Normal liver. Gallbladder is distended with mild wall thickening. Gallbladder contains mildly opaque gallstones. No adjacent fat inflammation. No bile duct dilation. Pancreas: Unremarkable. No pancreatic ductal dilatation or surrounding inflammatory changes. Spleen: Normal in size without focal abnormality. Adrenals/Urinary Tract: No adrenal masses. Mild bilateral renal cortical thinning. Kidneys normal in size, orientation and position with symmetric enhancement and excretion. Two right renal cysts, midpole 3 cm cyst and upper pole 1 cm cyst. No other renal masses, no stones and no hydronephrosis. Ureters are normal in  course and caliber. Bladder is unremarkable. Stomach/Bowel: Normal stomach. Small bowel and colon are normal in caliber. No wall thickening. No inflammation. Mild increased  colonic stool burden similar to the prior CT. Normal appendix visualized. Vascular/Lymphatic: Aortic atherosclerosis. No aneurysm. No enlarged lymph nodes. Reproductive: Uterus and bilateral adnexa are unremarkable. Other: No abdominal wall hernia or abnormality. No abdominopelvic ascites. Musculoskeletal: No fracture or acute finding. No osteoblastic or osteolytic lesions. IMPRESSION: 1. Gallbladder wall thickening with gallstones, consistent with early acute cholecystitis in the proper clinical setting. Consider further assessment with limited right upper quadrant ultrasound. 2. No other evidence of an acute abnormality within the abdomen or pelvis. 3. Mild increased colonic stool burden.  No bowel inflammation. Electronically Signed   By: Lajean Manes M.D.   On: 06/19/2019 12:51   DG C-Arm 1-60 Min-No Report  Result Date: 06/22/2019 Fluoroscopy was utilized by the requesting physician.  No radiographic interpretation.   ECHOCARDIOGRAM COMPLETE  Result Date: 06/20/2019    ECHOCARDIOGRAM REPORT   Patient Name:   KAASHVI PANZICA Date of Exam: 06/20/2019 Medical Rec #:  [id number]    Height:       65.0 in Accession #:    [id number]   Weight:       180.0 lb Date of Birth:  08/20/1945    BSA:          1.892 m Patient Age:    27 years     BP:           158/69 mmHg Patient Gender: F            HR:           84 bpm. Exam Location:  [facility] Procedure: 2D Echo, Color Doppler and Cardiac Doppler STAT ECHO Indications:     Preoperative evaluation  History:         Patient has no prior history of Echocardiogram examinations.                  Pacemaker, Signs/Symptoms:Shortness of Breath, Dyspnea and                  Murmur; Risk Factors:Diabetes. Mitral valve problem.  Sonographer:     Sherrie Sport RDCS (AE) Referring Phys:  [id number] AVA SWAYZE Diagnosing Phys: Isaias Cowman MD IMPRESSIONS  1. Left ventricular ejection fraction, by estimation, is 50 to 55%. The left ventricle has low normal function. The left ventricle has no  regional wall motion abnormalities. There is mild left ventricular hypertrophy. Left ventricular diastolic parameters were normal.  2. Right ventricular systolic function is normal. The right ventricular size is normal. There is mildly elevated pulmonary artery systolic pressure.  3. The mitral valve is normal in structure. Mild to moderate mitral valve regurgitation. No evidence of mitral stenosis.  4. The aortic valve is normal in structure. Aortic valve regurgitation is mild to moderate. Moderate aortic valve stenosis.  5. The inferior vena cava is normal in size with greater than 50% respiratory variability, suggesting right atrial pressure of 3 mmHg. FINDINGS  Left Ventricle: Left ventricular ejection fraction, by estimation, is 50 to 55%. The left ventricle has low normal function. The left ventricle has no regional wall motion abnormalities. The left ventricular internal cavity size was normal in size. There is mild left ventricular hypertrophy. Left ventricular diastolic parameters were normal. Right Ventricle: The right ventricular size is normal. No increase in right ventricular wall thickness. Right ventricular systolic function is normal. There is mildly elevated pulmonary artery  systolic pressure. The tricuspid regurgitant velocity is 2.31  m/s, and with an assumed right atrial pressure of 10 mmHg, the estimated right ventricular systolic pressure is 123456 mmHg. Left Atrium: Left atrial size was normal in size. Right Atrium: Right atrial size was normal in size. Pericardium: There is no evidence of pericardial effusion. Mitral Valve: The mitral valve is normal in structure. Normal mobility of the mitral valve leaflets. Mild to moderate mitral valve regurgitation. No evidence of mitral valve stenosis. MV peak gradient, 11.0 mmHg. The mean mitral valve gradient is 4.0 mmHg. Tricuspid Valve: The tricuspid valve is normal in structure. Tricuspid valve regurgitation is mild . No evidence of tricuspid  stenosis. Aortic Valve: The aortic valve is normal in structure. Aortic valve regurgitation is mild to moderate. Moderate aortic stenosis is present. Aortic valve mean gradient measures 10.2 mmHg. Aortic valve peak gradient measures 15.9 mmHg. Aortic valve area, by VTI measures 1.32 cm. Pulmonic Valve: The pulmonic valve was normal in structure. Pulmonic valve regurgitation is not visualized. No evidence of pulmonic stenosis. Aorta: The aortic root is normal in size and structure. Venous: The inferior vena cava is normal in size with greater than 50% respiratory variability, suggesting right atrial pressure of 3 mmHg. IAS/Shunts: No atrial level shunt detected by color flow Doppler.  LEFT VENTRICLE PLAX 2D LVIDd:         3.73 cm  Diastology LVIDs:         2.33 cm  LV e' lateral:   6.53 cm/s LV PW:         1.05 cm  LV E/e' lateral: 22.8 LV IVS:        1.38 cm  LV e' medial:    5.44 cm/s LVOT diam:     2.00 cm  LV E/e' medial:  27.4 LV SV:         63 LV SV Index:   34 LVOT Area:     3.14 cm  RIGHT VENTRICLE RV Basal diam:  3.39 cm RV S prime:     12.80 cm/s TAPSE (M-mode): 4.0 cm LEFT ATRIUM              Index       RIGHT ATRIUM           Index LA diam:        3.10 cm  1.64 cm/m  RA Area:     19.30 cm LA Vol (A2C):   130.0 ml 68.73 ml/m RA Volume:   59.30 ml  31.35 ml/m LA Vol (A4C):   63.9 ml  33.78 ml/m LA Biplane Vol: 92.2 ml  48.74 ml/m  AORTIC VALVE                    PULMONIC VALVE AV Area (Vmax):    1.35 cm     PV Vmax:        0.66 m/s AV Area (Vmean):   1.30 cm     PV Peak grad:   1.7 mmHg AV Area (VTI):     1.32 cm     RVOT Peak grad: 2 mmHg AV Vmax:           199.56 cm/s AV Vmean:          144.480 cm/s AV VTI:            0.482 m AV Peak Grad:      15.9 mmHg AV Mean Grad:      10.2 mmHg LVOT Vmax:  85.70 cm/s LVOT Vmean:        59.900 cm/s LVOT VTI:          0.202 m LVOT/AV VTI ratio: 0.42  AORTA Ao Root diam: 2.60 cm MITRAL VALVE                TRICUSPID VALVE MV Area (PHT): 2.32 cm     TR  Peak grad:   21.3 mmHg MV Peak grad:  11.0 mmHg    TR Vmax:        231.00 cm/s MV Mean grad:  4.0 mmHg MV Vmax:       1.66 m/s     SHUNTS MV Vmean:      87.6 cm/s    Systemic VTI:  0.20 m MV Decel Time: 327 msec     Systemic Diam: 2.00 cm MV E velocity: 149.00 cm/s MV Fritzi Scripter velocity: 66.90 cm/s MV E/Kendrick Remigio ratio:  2.23 Isaias Cowman MD Electronically signed by Isaias Cowman MD Signature Date/Time: 06/20/2019/1:07:26 PM    Final    US ABDOMEN LIMITED RUQ  Result Date: 06/19/2019 CLINICAL DATA:  Right upper quadrant pain, vomiting, hyperbilirubinemia EXAM: ULTRASOUND ABDOMEN LIMITED RIGHT UPPER QUADRANT COMPARISON:  Abdominal ultrasound 08/18/2016 FINDINGS: Gallbladder: There are several shadowing gallstones measuring up to 1.5 cm as well as sludge in the gallbladder. Borderline wall thickening measuring 0.4 cm. No pericholecystic fluid. Negative sonographic Percell Miller sign however patient is medicated. Common bile duct: Diameter: 0.5 cm, within normal limits. Liver: No focal lesion identified. Liver parenchymal echogenicity is normal to minimally increased. Portal vein is patent on color Doppler imaging with normal direction of blood flow towards the liver. Other: None. IMPRESSION: 1. Cholelithiasis with borderline gallbladder wall thickening. No pericholecystic fluid. Negative sonographic Percell Miller sign though patient is medicated. Findings are indeterminate for acute cholecystitis. 2. Liver parenchymal echogenicity may be slightly increased as can be seen in early fatty infiltration. Electronically Signed   By: Audie Pinto M.D.   On: 06/19/2019 13:36    Microbiology: Recent Results (from the past 240 hour(s))  Respiratory Panel by RT PCR (Flu Idora Brosious&B, Covid) - Nasopharyngeal Swab     Status: None   Collection Time: 06/19/19  4:48 PM   Specimen: Nasopharyngeal Swab  Result Value Ref Range Status   SARS Coronavirus 2 by RT PCR NEGATIVE NEGATIVE Final    Comment: (NOTE) SARS-CoV-2 target nucleic acids are  NOT DETECTED. The SARS-CoV-2 RNA is generally detectable in upper respiratoy specimens during the acute phase of infection. The lowest concentration of SARS-CoV-2 viral copies this assay can detect is 131 copies/mL. Harbor Paster negative result does not preclude SARS-Cov-2 infection and should not be used as the sole basis for treatment or other patient management decisions. Zahki Hoogendoorn negative result may occur with  improper specimen collection/handling, submission of specimen other than nasopharyngeal swab, presence of viral mutation(s) within the areas targeted by this assay, and inadequate number of viral copies (<131 copies/mL). Sahra Converse negative result must be combined with clinical observations, patient history, and epidemiological information. The expected result is Negative. Fact Sheet for Patients:  PinkCheek.be Fact Sheet for Healthcare Providers:  GravelBags.it This test is not yet ap proved or cleared by the [place] FDA and  has been authorized for detection and/or diagnosis of SARS-CoV-2 by FDA under an Emergency Use Authorization (EUA). This EUA will remain  in effect (meaning this test can be used) for the duration of the COVID-19 declaration under Section 564(b)(1) of the Act, 21 U.S.C. section 360bbb-3(b)(1), unless the authorization  is terminated or revoked sooner.    Influenza Ameliah Baskins by PCR NEGATIVE NEGATIVE Final   Influenza B by PCR NEGATIVE NEGATIVE Final    Comment: (NOTE) The Xpert Xpress SARS-CoV-2/FLU/RSV assay is intended as an aid in  the diagnosis of influenza from Nasopharyngeal swab specimens and  should not be used as Migel Hannis sole basis for treatment. Nasal washings and  aspirates are unacceptable for Xpert Xpress SARS-CoV-2/FLU/RSV  testing. Fact Sheet for Patients: PinkCheek.be Fact Sheet for Healthcare Providers: GravelBags.it This test is not yet approved or  cleared by the [place] FDA and  has been authorized for detection and/or diagnosis of SARS-CoV-2 by  FDA under an Emergency Use Authorization (EUA). This EUA will remain  in effect (meaning this test can be used) for the duration of the  Covid-19 declaration under Section 564(b)(1) of the Act, 21  U.S.C. section 360bbb-3(b)(1), unless the authorization is  terminated or revoked. Performed at [facility], [address] [place], [place] [address]   CULTURE, BLOOD (ROUTINE X 2) w Reflex to ID Panel     Status: None (Preliminary result)   Collection Time: 06/20/19  2:57 PM   Specimen: BLOOD  Result Value Ref Range Status   Specimen Description BLOOD BLOOD LEFT HAND  Final   Special Requests   Final    BOTTLES DRAWN AEROBIC ONLY Blood Culture results may not be optimal due to an inadequate volume of blood received in culture bottles   Culture   Final    NO GROWTH 4 DAYS Performed at [facility], [address] [place], [place] [address]    Report Status PENDING  Incomplete  CULTURE, BLOOD (ROUTINE X 2) w Reflex to ID Panel     Status: None (Preliminary result)   Collection Time: 06/20/19  2:57 PM   Specimen: BLOOD  Result Value Ref Range Status   Specimen Description BLOOD BLOOD LEFT HAND  Final   Special Requests   Final    BOTTLES DRAWN AEROBIC ONLY Blood Culture results may not be optimal due to an inadequate volume of blood received in culture bottles   Culture   Final    NO GROWTH 4 DAYS Performed at [facility], [address] [place],  [address]    Report Status PENDING  Incomplete     Labs: Basic Metabolic Panel: Recent Labs  Lab 06/19/19 1131 06/20/19 0455 06/21/19 0501 06/22/19 0430 06/23/19 0332  NA 137 141 141 138 136  K 4.2 4.0 3.8 4.4 3.9  CL 101 105 107 105 104  CO2 28 28 31 25 25   GLUCOSE 120* 116* 103* 139* 124*  BUN 17 11 10 14  31*  CREATININE 0.70 0.73 0.76 0.77 0.77  CALCIUM 9.4 9.1 8.9 8.8* 8.6*  MG   --   --   --  2.0 1.9  PHOS  --   --   --  3.8 2.1*   Liver Function Tests: Recent Labs  Lab 06/19/19 1131 06/20/19 0455 06/21/19 0501 06/22/19 0430 06/23/19 0332  AST 1,625* 847* 340* 211* 120*  ALT 1,057* 914* 542* 407* 265*  ALKPHOS 121 159* 128* 119 147*  BILITOT 3.1* 4.2* 2.2* 1.6* 1.5*  PROT 7.2 6.7 6.0* 6.5 5.8*  ALBUMIN 4.0 3.7 3.3* 3.4* 3.1*   Recent Labs  Lab 06/19/19 1131 06/23/19 0332  LIPASE 167* 25   No results for input(s): AMMONIA in the last 168 hours. CBC: Recent Labs  Lab 06/19/19 0955 06/21/19 0501 06/22/19 0430 06/23/19  0332  WBC 6.9 5.7 8.4 8.8  NEUTROABS 4.7  --   --  5.3  HGB 13.7 10.8* 11.2* 10.1*  HCT 42.1 34.1* 35.8* 31.9*  MCV 101.2* 102.7* 104.1* 102.9*  PLT 217 178 203 184   Cardiac Enzymes: Recent Labs  Lab 06/19/19 1547  CKTOTAL 60   BNP: BNP (last 3 results) No results for input(s): BNP in the last 8760 hours.  ProBNP (last 3 results) No results for input(s): PROBNP in the last 8760 hours.  CBG: No results for input(s): GLUCAP in the last 168 hours.     Signed:  Fayrene Helper MD.  [facility] 06/24/2019, 8:39 PM

## 2019-06-25 LAB — CULTURE, BLOOD (ROUTINE X 2)
Culture: NO GROWTH
Culture: NO GROWTH

## 2019-06-26 NOTE — Anesthesia Postprocedure Evaluation (Signed)
Anesthesia Post Note  Patient: [name]  Procedure(s) Performed: ENDOSCOPIC RETROGRADE CHOLANGIOPANCREATOGRAPHY (ERCP) WITH PROPOFOL (N/A )  Patient location during evaluation: PACU Anesthesia Type: General Level of consciousness: awake and alert Pain management: pain level controlled Vital Signs Assessment: post-procedure vital signs reviewed and stable Respiratory status: spontaneous breathing, nonlabored ventilation, respiratory function stable and patient connected to nasal cannula oxygen Cardiovascular status: blood pressure returned to baseline and stable Postop Assessment: no apparent nausea or vomiting Anesthetic complications: no     Last Vitals:  Vitals:   06/23/19 0914 06/23/19 1140  BP: (!) 121/57 140/68  Pulse: 81 75  Resp:  15  Temp:  36.5 C  SpO2: 100% 100%    Last Pain:  Vitals:   06/23/19 1140  TempSrc: Oral  PainSc:                  Molli Barrows

## 2019-07-12 ENCOUNTER — Ambulatory Visit (INDEPENDENT_AMBULATORY_CARE_PROVIDER_SITE_OTHER): Payer: Self-pay | Admitting: Physician Assistant

## 2019-07-12 ENCOUNTER — Encounter: Payer: Self-pay | Admitting: Physician Assistant

## 2019-07-12 ENCOUNTER — Other Ambulatory Visit: Payer: Self-pay

## 2019-07-12 VITALS — BP 166/100 | HR 91 | Temp 97.9°F | Resp 14 | Ht 65.5 in | Wt 176.0 lb

## 2019-07-12 DIAGNOSIS — K8001 Calculus of gallbladder with acute cholecystitis with obstruction: Secondary | ICD-10-CM

## 2019-07-12 DIAGNOSIS — Z09 Encounter for follow-up examination after completed treatment for conditions other than malignant neoplasm: Secondary | ICD-10-CM

## 2019-07-12 NOTE — Patient Instructions (Addendum)
May take Miralax or a fiber supplement daily to help with constipation. May also take Senna if needed.           Follow-up with our office as needed.  Please call and ask to speak with a nurse if you develop questions or concerns.  GENERAL POST-OPERATIVE PATIENT INSTRUCTIONS   WOUND CARE INSTRUCTIONS: If clothing rubs against the wound or causes irritation and the wound is not draining you may cover it with a dry dressing during the daytime.  Try to keep the wound dry and avoid ointments on the wound unless directed to do so.  If the wound becomes bright red and painful or starts to drain infected material that is not clear, please contact your physician immediately.  If the wound is mildly pink and has a thick firm ridge underneath it, this is normal, and is referred to as a healing ridge.  This will resolve over the next 4-6 weeks.  BATHING: You may shower, however, please do not submerge in a tub, hot tub, or pool until incisions are completely sealed or have been told by your surgeon that you may do so.  DIET:  You may eat any foods that you can tolerate.  It is a good idea to eat a high fiber diet and take in plenty of fluids to prevent constipation.  If you do become constipated you may want to take a mild laxative or take ducolax tablets on a daily basis until your bowel habits are regular.  Constipation can be very uncomfortable, along with straining, after recent surgery.  ACTIVITY: You may want to hug a pillow when coughing and sneezing to add additional support to the surgical area, if you had abdominal or chest surgery, which will decrease pain during these times.  You are encouraged to walk and engage in light activity for the next two weeks.  You should not lift more than 20 pounds for 6 weeks after surgery as it could put you at increased risk for complications.  Twenty pounds is roughly equivalent to a plastic bag of groceries. At that time- Listen to your body when lifting, if you  have pain when lifting, stop and then try again in a few days. Soreness after doing exercises or activities of daily living is normal as you get back in to your normal routine.  MEDICATIONS:  Try to take narcotic medications and anti-inflammatory medications, such as tylenol, ibuprofen, naprosyn, etc., with food.  This will minimize stomach upset from the medication.  Should you develop nausea and vomiting from the pain medication, or develop a rash, please discontinue the medication and contact your physician.  You should not drive, make important decisions, or operate machinery when taking narcotic pain medication.  SUNBLOCK Use sun block to incision area over the next year if this area will be exposed to sun. This helps decrease scarring and will allow you avoid a permanent darkened area over your incision.  QUESTIONS:  Please feel free to call our office if you have any questions, and we will be glad to assist you.

## 2019-07-12 NOTE — Progress Notes (Signed)
[facility] POST-OP OFFICE VISIT  07/12/2019  HPI: Christie Carpenter is a 74 y.o. female 21 days s/p laparoscopic cholecystectomy and subsequent ERCP on 03/11 for cholecystitis and choledocholithiasis   Overall doing well No pain No fever, chills, nausea, or emesis She does not some early fullness with eating Baseline constipation which she manages at home, no diarrhea No other complaints.   Vital signs: BP (!) 166/100   Pulse 91   Temp 97.9 F (36.6 C)   Resp 14   Ht 5' 5.5" (1.664 m)   Wt 176 lb (79.8 kg)   SpO2 97%   BMI 28.84 kg/m    Physical Exam: Constitutional: Well appearing female, NAD Abdomen: Soft, non-tender, non-distended, no rebound/guarding Skin: Laparoscopic incisions are well healed, no erythema or drainage   Assessment/Plan: This is a 74 y.o. female 21 days s/p laparoscopic cholecystectomy and subsequent ERCP on 03/11 for cholecystitis and choledocholithiasis    - pain control prn  - reviewed lifting restrictions (4 weeks total)  - Reviewed pathology: acute and chronic cholecystitis with cholelithiasis, negative for malignancy  - rtc prn  -- Edison Simon, PA-C [facility] 07/12/2019, 11:10 AM [phone] M-F: 7am - 4pm

## 2020-02-19 ENCOUNTER — Other Ambulatory Visit: Payer: Self-pay | Admitting: Internal Medicine

## 2020-02-19 DIAGNOSIS — Z1231 Encounter for screening mammogram for malignant neoplasm of breast: Secondary | ICD-10-CM

## 2020-12-04 ENCOUNTER — Encounter: Payer: Self-pay | Admitting: Ophthalmology

## 2020-12-09 NOTE — Anesthesia Preprocedure Evaluation (Addendum)
Anesthesia Evaluation  Patient identified by MRN, date of birth, ID band Patient awake    Reviewed: Allergy & Precautions, NPO status , Patient's Chart, lab work & pertinent test results  History of Anesthesia Complications Negative for: history of anesthetic complications  Airway Mallampati: II   Neck ROM: Full    Dental  (+) Partial Lower, Partial Upper,    Pulmonary former smoker (quit 1993),    Pulmonary exam normal breath sounds clear to auscultation       Cardiovascular + CAD and +CHF  + dysrhythmias (paroxysmal a fib on Eliquis) + pacemaker (SSS) + Cardiac Defibrillator + Valvular Problems/Murmurs (s/p MVR 2016; mild AS; pulmonary HTN)  Rhythm:Regular Rate:Normal + Systolic murmurs    Neuro/Psych PSYCHIATRIC DISORDERS Depression 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)    GI/Hepatic negative GI ROS,   Endo/Other  diabetesHypothyroidism   Renal/GU negative Renal ROS     Musculoskeletal   Abdominal   Peds  Hematology Non-Hodgkin lymphoma   Anesthesia Other Findings   Reproductive/Obstetrics                            Anesthesia Physical Anesthesia Plan  ASA: 4  Anesthesia Plan: MAC   Post-op Pain Management:    Induction: Intravenous  PONV Risk Score and Plan: 2 and TIVA, Midazolam and Treatment may vary due to age or medical condition  Airway Management Planned: Nasal Cannula  Additional Equipment:   Intra-op Plan:   Post-operative Plan:   Informed Consent: I have reviewed the patients History and Physical, chart, labs and discussed the procedure including the risks, benefits and alternatives for the proposed anesthesia with the patient or authorized representative who has indicated his/her understanding and acceptance.       Plan Discussed with: CRNA  Anesthesia Plan Comments:        Anesthesia Quick Evaluation

## 2020-12-10 NOTE — Discharge Instructions (Signed)

## 2020-12-11 ENCOUNTER — Ambulatory Visit
Admission: RE | Admit: 2020-12-11 | Discharge: 2020-12-11 | Disposition: A | Discharge status: Home / Self Care | Payer: Medicare Other | Attending: Ophthalmology | Admitting: Ophthalmology

## 2020-12-11 ENCOUNTER — Ambulatory Visit: Payer: Medicare Other | Admitting: Anesthesiology

## 2020-12-11 ENCOUNTER — Other Ambulatory Visit: Payer: Self-pay

## 2020-12-11 ENCOUNTER — Encounter
Admission: RE | Disposition: A | Discharge status: Home / Self Care | Payer: Self-pay | Source: Home / Self Care | Attending: Ophthalmology

## 2020-12-11 ENCOUNTER — Encounter: Payer: Self-pay | Admitting: Ophthalmology

## 2020-12-11 DIAGNOSIS — Z87891 Personal history of nicotine dependence: Secondary | ICD-10-CM | POA: Diagnosis not present

## 2020-12-11 DIAGNOSIS — Z79890 Hormone replacement therapy (postmenopausal): Secondary | ICD-10-CM | POA: Insufficient documentation

## 2020-12-11 DIAGNOSIS — Z888 Allergy status to other drugs, medicaments and biological substances status: Secondary | ICD-10-CM | POA: Insufficient documentation

## 2020-12-11 DIAGNOSIS — Z79899 Other long term (current) drug therapy: Secondary | ICD-10-CM | POA: Diagnosis not present

## 2020-12-11 DIAGNOSIS — H2512 Age-related nuclear cataract, left eye: Secondary | ICD-10-CM | POA: Diagnosis present

## 2020-12-11 DIAGNOSIS — Z7901 Long term (current) use of anticoagulants: Secondary | ICD-10-CM | POA: Insufficient documentation

## 2020-12-11 DIAGNOSIS — Z885 Allergy status to narcotic agent status: Secondary | ICD-10-CM | POA: Insufficient documentation

## 2020-12-11 HISTORY — DX: Polyneuropathy, unspecified: G62.9

## 2020-12-11 HISTORY — DX: Sick sinus syndrome: I49.5

## 2020-12-11 HISTORY — DX: Presence of dental prosthetic device (complete) (partial): Z97.2

## 2020-12-11 HISTORY — PX: CATARACT EXTRACTION W/PHACO: SHX586

## 2020-12-11 HISTORY — DX: Presence of automatic (implantable) cardiac defibrillator: Z95.810

## 2020-12-11 SURGERY — PHACOEMULSIFICATION, CATARACT, WITH IOL INSERTION
Anesthesia: Monitor Anesthesia Care | Site: Eye | Laterality: Left

## 2020-12-11 MED ORDER — SIGHTPATH DOSE#1 BSS IO SOLN
INTRAOCULAR | Status: DC | PRN
  Administered 2020-12-11: 15 mL

## 2020-12-11 MED ORDER — SIGHTPATH DOSE#1 BSS IO SOLN
INTRAOCULAR | Status: DC | PRN
  Administered 2020-12-11: 64 mL via OPHTHALMIC

## 2020-12-11 MED ORDER — CYCLOPENTOLATE HCL 2 % OP SOLN
1.0000 [drp] | OPHTHALMIC | Status: AC | PRN
  Administered 2020-12-11 (×3): 1 [drp] via OPHTHALMIC

## 2020-12-11 MED ORDER — ACETAMINOPHEN 325 MG PO TABS: 650.0000 mg | ORAL_TABLET | Freq: Once | ORAL | Status: DC | PRN

## 2020-12-11 MED ORDER — SIGHTPATH DOSE#1 BSS IO SOLN
INTRAOCULAR | Status: DC | PRN
  Administered 2020-12-11: 1 mL

## 2020-12-11 MED ORDER — FENTANYL CITRATE (PF) 100 MCG/2ML IJ SOLN
INTRAMUSCULAR | Status: DC | PRN
  Administered 2020-12-11: 50 ug via INTRAVENOUS

## 2020-12-11 MED ORDER — SIGHTPATH DOSE#1 NA HYALUR & NA CHOND-NA HYALUR IO KIT
PACK | INTRAOCULAR | Status: DC | PRN
  Administered 2020-12-11: 1 via OPHTHALMIC

## 2020-12-11 MED ORDER — CEFUROXIME OPHTHALMIC INJECTION 1 MG/0.1 ML
INJECTION | OPHTHALMIC | Status: DC | PRN
  Administered 2020-12-11: 0.1 mL via INTRACAMERAL

## 2020-12-11 MED ORDER — BRIMONIDINE TARTRATE-TIMOLOL 0.2-0.5 % OP SOLN
OPHTHALMIC | Status: DC | PRN
  Administered 2020-12-11: 1 [drp] via OPHTHALMIC

## 2020-12-11 MED ORDER — ACETAMINOPHEN 160 MG/5ML PO SOLN: 325.0000 mg | ORAL | Status: DC | PRN

## 2020-12-11 MED ORDER — ONDANSETRON HCL 4 MG/2ML IJ SOLN: 4.0000 mg | Freq: Once | INTRAMUSCULAR | Status: DC | PRN

## 2020-12-11 MED ORDER — TETRACAINE HCL 0.5 % OP SOLN
1.0000 [drp] | OPHTHALMIC | Status: DC | PRN
  Administered 2020-12-11 (×3): 1 [drp] via OPHTHALMIC

## 2020-12-11 MED ORDER — LACTATED RINGERS IV SOLN: INTRAVENOUS | Status: DC

## 2020-12-11 MED ORDER — MIDAZOLAM HCL 2 MG/2ML IJ SOLN
INTRAMUSCULAR | Status: DC | PRN
  Administered 2020-12-11: 1.5 mg via INTRAVENOUS

## 2020-12-11 MED ORDER — PHENYLEPHRINE HCL 10 % OP SOLN
1.0000 [drp] | OPHTHALMIC | Status: AC | PRN
  Administered 2020-12-11 (×3): 1 [drp] via OPHTHALMIC

## 2020-12-11 SURGICAL SUPPLY — 16 items
CANNULA ANT/CHMB 27GA (MISCELLANEOUS) ×2 IMPLANT
GLOVE SRG 8 PF TXTR STRL LF DI (GLOVE) ×1 IMPLANT
GLOVE SURG ENC TEXT LTX SZ7.5 (GLOVE) ×2 IMPLANT
GLOVE SURG UNDER POLY LF SZ8 (GLOVE) ×2
GOWN STRL REUS W/ TWL LRG LVL3 (GOWN DISPOSABLE) ×2 IMPLANT
GOWN STRL REUS W/TWL LRG LVL3 (GOWN DISPOSABLE) ×4
LENS IOL TECNIS EYHANCE 19.0 (Intraocular Lens) ×2 IMPLANT
MARKER SKIN DUAL TIP RULER LAB (MISCELLANEOUS) ×2 IMPLANT
NEEDLE CAPSULORHEX 25GA (NEEDLE) ×2 IMPLANT
NEEDLE FILTER BLUNT 18X 1/2SAF (NEEDLE) ×2
NEEDLE FILTER BLUNT 18X1 1/2 (NEEDLE) ×2 IMPLANT
PACK EYE AFTER SURG (MISCELLANEOUS) ×2 IMPLANT
SYR 3ML LL SCALE MARK (SYRINGE) ×4 IMPLANT
SYR TB 1ML LUER SLIP (SYRINGE) ×2 IMPLANT
WATER STERILE IRR 250ML POUR (IV SOLUTION) ×2 IMPLANT
WIPE NON LINTING 3.25X3.25 (MISCELLANEOUS) ×2 IMPLANT

## 2020-12-11 NOTE — Anesthesia Postprocedure Evaluation (Signed)
Anesthesia Post Note  Patient: Christie Carpenter  Procedure(s) Performed: CATARACT EXTRACTION PHACO AND INTRAOCULAR LENS PLACEMENT (IOC) LEFT 8.55 01:06.9 (Left: Eye)     Patient location during evaluation: PACU Anesthesia Type: MAC Level of consciousness: awake and alert, oriented and patient cooperative Pain management: pain level controlled Vital Signs Assessment: post-procedure vital signs reviewed and stable Respiratory status: spontaneous breathing, nonlabored ventilation and respiratory function stable Cardiovascular status: blood pressure returned to baseline and stable Postop Assessment: adequate PO intake Anesthetic complications: no   No notable events documented.  Darrin Nipper

## 2020-12-11 NOTE — Transfer of Care (Signed)
Immediate Anesthesia Transfer of Care Note  Patient: Christie Carpenter  Procedure(s) Performed: CATARACT EXTRACTION PHACO AND INTRAOCULAR LENS PLACEMENT (IOC) LEFT 8.55 01:06.9 (Left: Eye)  Patient Location: PACU  Anesthesia Type: MAC  Level of Consciousness: awake, alert  and patient cooperative  Airway and Oxygen Therapy: Patient Spontanous Breathing and Patient connected to supplemental oxygen  Post-op Assessment: Post-op Vital signs reviewed, Patient's Cardiovascular Status Stable, Respiratory Function Stable, Patent Airway and No signs of Nausea or vomiting  Post-op Vital Signs: Reviewed and stable  Complications: No notable events documented.

## 2020-12-11 NOTE — Op Note (Signed)
  OPERATIVE NOTE  Christie Carpenter [id number] 12/11/2020   PREOPERATIVE DIAGNOSIS:  Nuclear sclerotic cataract left eye. H25.12   POSTOPERATIVE DIAGNOSIS:    Nuclear sclerotic cataract left eye.     PROCEDURE:  Phacoemusification with posterior chamber intraocular lens placement of the left eye  Ultrasound time: Procedure(s): CATARACT EXTRACTION PHACO AND INTRAOCULAR LENS PLACEMENT (IOC) LEFT 8.55 01:06.9 (Left)  LENS:   Implant Name Type Inv. Item Serial No. Manufacturer Lot No. LRB No. Used Action  LENS IOL TECNIS EYHANCE 19.0 - [id number] Intraocular Lens LENS IOL TECNIS EYHANCE 19.0 [id number] [id number]   Left 1 Implanted      SURGEON:  Wyonia Hough, MD   ANESTHESIA:  Topical with tetracaine drops and 2% Xylocaine jelly, augmented with 1% preservative-free intracameral lidocaine.    COMPLICATIONS:  None.   DESCRIPTION OF PROCEDURE:  The patient was identified in the holding room and transported to the operating room and placed in the supine position under the operating microscope.  The left eye was identified as the operative eye and it was prepped and draped in the usual sterile ophthalmic fashion.   A 1 millimeter clear-corneal paracentesis was made at the 1:30 position.  0.5 ml of preservative-free 1% lidocaine was injected into the anterior chamber.  The anterior chamber was filled with Viscoat viscoelastic.  A 2.4 millimeter keratome was used to make a near-clear corneal incision at the 10:30 position.  .  A curvilinear capsulorrhexis was made with a cystotome and capsulorrhexis forceps.  Balanced salt solution was used to hydrodissect and hydrodelineate the nucleus.   Phacoemulsification was then used in stop and chop fashion to remove the lens nucleus and epinucleus.  The remaining cortex was then removed using the irrigation and aspiration handpiece. Provisc was then placed into the capsular bag to distend it for lens placement.  A lens was then injected into the  capsular bag.  The remaining viscoelastic was aspirated.   Wounds were hydrated with balanced salt solution.  The anterior chamber was inflated to a physiologic pressure with balanced salt solution.  No wound leaks were noted. Cefuroxime 0.1 ml of a '10mg'$ /ml solution was injected into the anterior chamber for a dose of 1 mg of intracameral antibiotic at the completion of the case.   Timolol and Brimonidine drops were applied to the eye.  The patient was taken to the recovery room in stable condition without complications of anesthesia or surgery.  [name] 12/11/2020, 1:04 PM

## 2020-12-11 NOTE — H&P (Signed)
[facility]   Primary Care Physician:  Christie Lank, MD Ophthalmologist: Dr. Leandrew Koyanagi  Pre-Procedure History & Physical: HPI:  Christie Carpenter is a 75 y.o. female here for ophthalmic surgery.   Past Medical History:  Diagnosis Date   AICD (automatic cardioverter/defibrillator) present    Cancer (Christie Carpenter) 1987   non hodgkins lymphoma   CHF (congestive heart failure) ([place])    Coronary artery disease    Depression    Diabetes mellitus without complication (HCC)    Heart murmur    Hypothyroidism    Mitral valve problem    Peripheral neuropathy    Presence of permanent cardiac pacemaker    Pulmonary hypertension (HCC)    Shortness of breath dyspnea    Sick sinus syndrome (Christie Carpenter)    Stroke (Christie Carpenter) 2016   IN ICU after Mitral valve surgery   Wears dentures    partial upper and lower    Past Surgical History:  Procedure Laterality Date   CHOLECYSTECTOMY N/A 06/21/2019   Procedure: LAPAROSCOPIC CHOLECYSTECTOMY WITH INTRAOPERATIVE CHOLANGIOGRAM;  Surgeon: Fredirick Maudlin, MD;  Location: [facility];  Service: General;  Laterality: N/A;   ENDOSCOPIC RETROGRADE CHOLANGIOPANCREATOGRAPHY (ERCP) WITH PROPOFOL N/A 06/22/2019   Procedure: ENDOSCOPIC RETROGRADE CHOLANGIOPANCREATOGRAPHY (ERCP) WITH PROPOFOL;  Surgeon: Lucilla Lame, MD;  Location: [facility];  Service: Endoscopy;  Laterality: N/A;   ICD IMPLANT  2017   MITRAL VALVE REPAIR  2016   PACEMAKER INSERTION  2015    Prior to Admission medications   Medication Sig Start Date End Date Taking? Authorizing Provider  allopurinol (ZYLOPRIM) 300 MG tablet Take 300 mg by mouth daily.   Yes [provider]  apixaban (ELIQUIS) 5 MG TABS tablet Take 5 mg by mouth 2 (two) times daily.   Yes [provider]  atorvastatin (LIPITOR) 80 MG tablet Take 1 tablet by mouth daily. 11/26/14 12/11/20 Yes [provider]  cetirizine (ZYRTEC) 5 MG tablet Take 5 mg by mouth daily.   Yes [provider]   ezetimibe (ZETIA) 10 MG tablet Take 10 mg by mouth daily. 12/20/18 12/11/20 Yes [provider]  furosemide (LASIX) 20 MG tablet Take 20 mg by mouth daily as needed for fluid.    Yes [provider]  levothyroxine (SYNTHROID) 112 MCG tablet Take 100 mcg by mouth every morning. 12/01/14 12/11/20 Yes [provider]  lisinopril (PRINIVIL,ZESTRIL) 2.5 MG tablet Take by mouth daily. 12/01/14 12/11/20 Yes [provider]  metoprolol succinate (TOPROL-XL) 25 MG 24 hr tablet Take 1 tablet by mouth daily. 12/25/14 12/11/20 Yes [provider]  Multiple Vitamin (MULTIVITAMIN WITH MINERALS) TABS tablet Take 1 tablet by mouth daily.   Yes [provider]  spironolactone (ALDACTONE) 25 MG tablet Take 0.5 tablets by mouth daily. 12/01/14 12/11/20 Yes [provider]    Allergies as of 11/13/2020 - Review Complete 07/12/2019  Allergen Reaction Noted   Oxycodone Shortness Of Breath 07/20/2016   Bupropion Itching 06/19/2019    Family History  Problem Relation Age of Onset   Leukemia Other     Social History   Socioeconomic History   Marital status: Widowed    Spouse name: Not on file   Number of children: Not on file   Years of education: Not on file   Highest education level: Not on file  Occupational History   Not on file  Tobacco Use   Smoking status: Former    Types: Cigarettes    Quit date: 1993    Years since quitting:  29.6   Smokeless tobacco: Never  Vaping Use   Vaping Use: Never used  Substance and Sexual Activity   Alcohol use: No   Drug use: No   Sexual activity: Not on file  Other Topics Concern   Not on file  Social History Narrative   Not on file   Social Determinants of Health   Financial Resource Strain: Not on file  Food Insecurity: Not on file  Transportation Needs: Not on file  Physical Activity: Not on file  Stress: Not on file  Social Connections: Not on file  Intimate Partner Violence: Not on file     Review of Systems: See HPI, otherwise negative ROS  Physical Exam: BP 124/67   Pulse 91   Temp 97.6 F (36.4 C) (Temporal)   Ht '5\' 5"'$  (1.651 m)   Wt 83.9 kg   SpO2 98%   BMI 30.79 kg/m  General:   Alert,  pleasant and cooperative in NAD Head:  Normocephalic and atraumatic. Lungs:  Clear to auscultation.    Heart:  Regular rate and rhythm.   Impression/Plan: Christie Carpenter is here for ophthalmic surgery.  Risks, benefits, limitations, and alternatives regarding ophthalmic surgery have been reviewed with the patient.  Questions have been answered.  All parties agreeable.   Leandrew Koyanagi, MD  12/11/2020, 11:32 AM

## 2020-12-11 NOTE — Anesthesia Procedure Notes (Signed)
Procedure Name: MAC Date/Time: 12/11/2020 12:44 PM Performed by: Mayme Genta, CRNA Pre-anesthesia Checklist: Patient identified, Emergency Drugs available, Suction available, Timeout performed and Patient being monitored Patient Re-evaluated:Patient Re-evaluated prior to induction Oxygen Delivery Method: Nasal cannula Placement Confirmation: positive ETCO2

## 2020-12-12 ENCOUNTER — Encounter: Payer: Self-pay | Admitting: Ophthalmology

## 2020-12-12 ENCOUNTER — Other Ambulatory Visit: Payer: Self-pay

## 2020-12-17 ENCOUNTER — Ambulatory Visit: Admit: 2020-12-17 | Payer: Medicare Other | Admitting: Ophthalmology

## 2020-12-17 SURGERY — PHACOEMULSIFICATION, CATARACT, WITH IOL INSERTION
Anesthesia: Topical | Laterality: Right

## 2020-12-20 NOTE — Discharge Instructions (Signed)

## 2020-12-22 NOTE — Anesthesia Preprocedure Evaluation (Addendum)
Anesthesia Evaluation  Patient identified by MRN, date of birth, ID band Patient awake    Reviewed: Allergy & Precautions, NPO status , Patient's Chart, lab work & pertinent test results  History of Anesthesia Complications Negative for: history of anesthetic complications  Airway Mallampati: II   Neck ROM: Full    Dental  (+) Partial Lower, Partial Upper,    Pulmonary former smoker,    Pulmonary exam normal breath sounds clear to auscultation       Cardiovascular + CAD and +CHF  + dysrhythmias (paroxysmal a fib on Eliquis) + pacemaker (SSS) + Cardiac Defibrillator + Valvular Problems/Murmurs (s/p MVR 2016; mild AS; pulmonary HTN)  Rhythm:Regular Rate:Normal + Systolic murmurs    Neuro/Psych PSYCHIATRIC DISORDERS Depression 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)    GI/Hepatic negative GI ROS,   Endo/Other  diabetesHypothyroidism   Renal/GU negative Renal ROS     Musculoskeletal   Abdominal   Peds  Hematology Non-Hodgkin lymphoma   Anesthesia Other Findings   Reproductive/Obstetrics                             Anesthesia Physical  Anesthesia Plan  ASA: 4  Anesthesia Plan: MAC   Post-op Pain Management:    Induction: Intravenous  PONV Risk Score and Plan: 2 and TIVA, Midazolam and Treatment may vary due to age or medical condition  Airway Management Planned: Nasal Cannula  Additional Equipment:   Intra-op Plan:   Post-operative Plan:   Informed Consent:   Plan Discussed with:   Anesthesia Plan Comments:         Anesthesia Quick Evaluation

## 2021-01-01 ENCOUNTER — Ambulatory Visit: Payer: Medicare Other | Admitting: Anesthesiology

## 2021-01-01 ENCOUNTER — Other Ambulatory Visit: Payer: Self-pay

## 2021-01-01 ENCOUNTER — Encounter: Payer: Self-pay | Admitting: Ophthalmology

## 2021-01-01 ENCOUNTER — Encounter
Admission: RE | Disposition: A | Discharge status: Home / Self Care | Payer: Self-pay | Source: Home / Self Care | Attending: Ophthalmology

## 2021-01-01 ENCOUNTER — Ambulatory Visit
Admission: RE | Admit: 2021-01-01 | Discharge: 2021-01-01 | Disposition: A | Discharge status: Home / Self Care | Payer: Medicare Other | Attending: Ophthalmology | Admitting: Ophthalmology

## 2021-01-01 DIAGNOSIS — E1142 Type 2 diabetes mellitus with diabetic polyneuropathy: Secondary | ICD-10-CM | POA: Insufficient documentation

## 2021-01-01 DIAGNOSIS — Z79899 Other long term (current) drug therapy: Secondary | ICD-10-CM | POA: Insufficient documentation

## 2021-01-01 DIAGNOSIS — I2510 Atherosclerotic heart disease of native coronary artery without angina pectoris: Secondary | ICD-10-CM | POA: Insufficient documentation

## 2021-01-01 DIAGNOSIS — Z9049 Acquired absence of other specified parts of digestive tract: Secondary | ICD-10-CM | POA: Diagnosis not present

## 2021-01-01 DIAGNOSIS — I509 Heart failure, unspecified: Secondary | ICD-10-CM | POA: Diagnosis not present

## 2021-01-01 DIAGNOSIS — I272 Pulmonary hypertension, unspecified: Secondary | ICD-10-CM | POA: Diagnosis not present

## 2021-01-01 DIAGNOSIS — Z961 Presence of intraocular lens: Secondary | ICD-10-CM | POA: Diagnosis not present

## 2021-01-01 DIAGNOSIS — Z8673 Personal history of transient ischemic attack (TIA), and cerebral infarction without residual deficits: Secondary | ICD-10-CM | POA: Diagnosis not present

## 2021-01-01 DIAGNOSIS — E1136 Type 2 diabetes mellitus with diabetic cataract: Secondary | ICD-10-CM | POA: Insufficient documentation

## 2021-01-01 DIAGNOSIS — Z888 Allergy status to other drugs, medicaments and biological substances status: Secondary | ICD-10-CM | POA: Insufficient documentation

## 2021-01-01 DIAGNOSIS — Z7901 Long term (current) use of anticoagulants: Secondary | ICD-10-CM | POA: Insufficient documentation

## 2021-01-01 DIAGNOSIS — Z95810 Presence of automatic (implantable) cardiac defibrillator: Secondary | ICD-10-CM | POA: Diagnosis not present

## 2021-01-01 DIAGNOSIS — H2511 Age-related nuclear cataract, right eye: Secondary | ICD-10-CM | POA: Insufficient documentation

## 2021-01-01 DIAGNOSIS — Z9842 Cataract extraction status, left eye: Secondary | ICD-10-CM | POA: Insufficient documentation

## 2021-01-01 DIAGNOSIS — Z885 Allergy status to narcotic agent status: Secondary | ICD-10-CM | POA: Diagnosis not present

## 2021-01-01 DIAGNOSIS — Z79890 Hormone replacement therapy (postmenopausal): Secondary | ICD-10-CM | POA: Diagnosis not present

## 2021-01-01 DIAGNOSIS — E039 Hypothyroidism, unspecified: Secondary | ICD-10-CM | POA: Insufficient documentation

## 2021-01-01 DIAGNOSIS — Z87891 Personal history of nicotine dependence: Secondary | ICD-10-CM | POA: Diagnosis not present

## 2021-01-01 HISTORY — PX: CATARACT EXTRACTION W/PHACO: SHX586

## 2021-01-01 SURGERY — PHACOEMULSIFICATION, CATARACT, WITH IOL INSERTION
Anesthesia: Monitor Anesthesia Care | Site: Eye | Laterality: Right

## 2021-01-01 MED ORDER — TETRACAINE HCL 0.5 % OP SOLN
1.0000 [drp] | OPHTHALMIC | Status: DC | PRN
  Administered 2021-01-01 (×3): 1 [drp] via OPHTHALMIC

## 2021-01-01 MED ORDER — MIDAZOLAM HCL 2 MG/2ML IJ SOLN
INTRAMUSCULAR | Status: DC | PRN
  Administered 2021-01-01: 2 mg via INTRAVENOUS

## 2021-01-01 MED ORDER — CEFUROXIME OPHTHALMIC INJECTION 1 MG/0.1 ML
INJECTION | OPHTHALMIC | Status: DC | PRN
  Administered 2021-01-01: 0.1 mL via INTRACAMERAL

## 2021-01-01 MED ORDER — SIGHTPATH DOSE#1 BSS IO SOLN
INTRAOCULAR | Status: DC | PRN
  Administered 2021-01-01: 73 mL via OPHTHALMIC

## 2021-01-01 MED ORDER — LACTATED RINGERS IV SOLN: INTRAVENOUS | Status: DC

## 2021-01-01 MED ORDER — SIGHTPATH DOSE#1 BSS IO SOLN
INTRAOCULAR | Status: DC | PRN
  Administered 2021-01-01: 2 mL

## 2021-01-01 MED ORDER — BRIMONIDINE TARTRATE-TIMOLOL 0.2-0.5 % OP SOLN
OPHTHALMIC | Status: DC | PRN
  Administered 2021-01-01: 1 [drp] via OPHTHALMIC

## 2021-01-01 MED ORDER — ARMC OPHTHALMIC DILATING DROPS
1.0000 "application " | OPHTHALMIC | Status: DC | PRN
  Administered 2021-01-01 (×3): 1 via OPHTHALMIC

## 2021-01-01 MED ORDER — SIGHTPATH DOSE#1 NA HYALUR & NA CHOND-NA HYALUR IO KIT
PACK | INTRAOCULAR | Status: DC | PRN
  Administered 2021-01-01: 1 via OPHTHALMIC

## 2021-01-01 MED ORDER — SIGHTPATH DOSE#1 BSS IO SOLN
INTRAOCULAR | Status: DC | PRN
  Administered 2021-01-01: 15 mL via INTRAOCULAR

## 2021-01-01 MED ORDER — FENTANYL CITRATE (PF) 100 MCG/2ML IJ SOLN
INTRAMUSCULAR | Status: DC | PRN
  Administered 2021-01-01: 50 ug via INTRAVENOUS

## 2021-01-01 SURGICAL SUPPLY — 23 items
CANNULA ANT/CHMB 27GA (MISCELLANEOUS) IMPLANT
GLOVE SRG 8 PF TXTR STRL LF DI (GLOVE) ×1 IMPLANT
GLOVE SURG ENC TEXT LTX SZ7.5 (GLOVE) ×2 IMPLANT
GLOVE SURG GAMMEX PI TX LF 7.5 (GLOVE) IMPLANT
GLOVE SURG UNDER POLY LF SZ8 (GLOVE) ×2
GOWN STRL REUS W/ TWL LRG LVL3 (GOWN DISPOSABLE) ×2 IMPLANT
GOWN STRL REUS W/TWL LRG LVL3 (GOWN DISPOSABLE) ×4
LENS IOL TECNIS EYHANCE 15.5 (Intraocular Lens) ×2 IMPLANT
MARKER SKIN DUAL TIP RULER LAB (MISCELLANEOUS) ×2 IMPLANT
NDL RETROBULBAR .5 NSTRL (NEEDLE) IMPLANT
NEEDLE CAPSULORHEX 25GA (NEEDLE) IMPLANT
NEEDLE FILTER BLUNT 18X 1/2SAF (NEEDLE) ×2
NEEDLE FILTER BLUNT 18X1 1/2 (NEEDLE) ×2 IMPLANT
PACK EYE AFTER SURG (MISCELLANEOUS) IMPLANT
RING MALYGIN 7.0 (MISCELLANEOUS) IMPLANT
SUT ETHILON 10-0 CS-B-6CS-B-6 (SUTURE)
SUT VICRYL  9 0 (SUTURE)
SUT VICRYL 9 0 (SUTURE) IMPLANT
SUTURE EHLN 10-0 CS-B-6CS-B-6 (SUTURE) IMPLANT
SYR 3ML LL SCALE MARK (SYRINGE) ×4 IMPLANT
SYR TB 1ML LUER SLIP (SYRINGE) ×2 IMPLANT
WATER STERILE IRR 250ML POUR (IV SOLUTION) ×2 IMPLANT
WIPE NON LINTING 3.25X3.25 (MISCELLANEOUS) ×2 IMPLANT

## 2021-01-01 NOTE — Anesthesia Procedure Notes (Signed)
Procedure Name: MAC Date/Time: 01/01/2021 12:48 PM Performed by: Jeannene Patella, CRNA Pre-anesthesia Checklist: Patient identified, Emergency Drugs available, Suction available, Timeout performed and Patient being monitored Patient Re-evaluated:Patient Re-evaluated prior to induction Oxygen Delivery Method: Nasal cannula Placement Confirmation: positive ETCO2

## 2021-01-01 NOTE — Transfer of Care (Signed)
Immediate Anesthesia Transfer of Care Note  Patient: Christie Carpenter  Procedure(s) Performed: CATARACT EXTRACTION PHACO AND INTRAOCULAR LENS PLACEMENT (IOC) RIGHT 8.88 01:11.0 (Right: Eye)  Patient Location: PACU  Anesthesia Type: MAC  Level of Consciousness: awake, alert  and patient cooperative  Airway and Oxygen Therapy: Patient Spontanous Breathing and Patient connected to supplemental oxygen  Post-op Assessment: Post-op Vital signs reviewed, Patient's Cardiovascular Status Stable, Respiratory Function Stable, Patent Airway and No signs of Nausea or vomiting  Post-op Vital Signs: Reviewed and stable  Complications: No notable events documented.

## 2021-01-01 NOTE — H&P (Signed)
[facility]   Primary Care Physician:  Denton Lank, MD Ophthalmologist: Dr. Leandrew Koyanagi  Pre-Procedure History & Physical: HPI:  Christie Carpenter is a 75 y.o. female here for ophthalmic surgery.   Past Medical History:  Diagnosis Date   AICD (automatic cardioverter/defibrillator) present    Cancer ([place]) 1987   non hodgkins lymphoma   CHF (congestive heart failure) ([place])    Coronary artery disease    Depression    Diabetes mellitus without complication (HCC)    Heart murmur    Hypothyroidism    Mitral valve problem    Peripheral neuropathy    Presence of permanent cardiac pacemaker    Pulmonary hypertension (HCC)    Shortness of breath dyspnea    Sick sinus syndrome ([place])    Stroke ([place]) 2016   IN ICU after Mitral valve surgery   Wears dentures    partial upper and lower    Past Surgical History:  Procedure Laterality Date   CATARACT EXTRACTION W/PHACO Left 12/11/2020   Procedure: CATARACT EXTRACTION PHACO AND INTRAOCULAR LENS PLACEMENT (IOC) LEFT 8.55 01:06.9;  Surgeon: Leandrew Koyanagi, MD;  Location: [facility];  Service: Ophthalmology;  Laterality: Left;   CHOLECYSTECTOMY N/A 06/21/2019   Procedure: LAPAROSCOPIC CHOLECYSTECTOMY WITH INTRAOPERATIVE CHOLANGIOGRAM;  Surgeon: Fredirick Maudlin, MD;  Location: [facility] OR[facility];  Service: General;  Laterality: N/A;   ENDOSCOPIC RETROGRADE CHOLANGIOPANCREATOGRAPHY (ERCP) WITH PROPOFOL N/A 06/22/2019   Procedure: ENDOSCOPIC RETROGRADE CHOLANGIOPANCREATOGRAPHY (ERCP) WITH PROPOFOL;  Surgeon: Lucilla Lame, MD;  Location: [facility];  Service: Endoscopy;  Laterality: N/A;   ICD IMPLANT  2017   MITRAL VALVE REPAIR  2016   PACEMAKER INSERTION  2015    Prior to Admission medications   Medication Sig Start Date End Date Taking? Authorizing Provider  allopurinol (ZYLOPRIM) 300 MG tablet Take 300 mg by mouth daily.   Yes [provider]  apixaban (ELIQUIS) 5 MG TABS tablet Take 5 mg by mouth 2 (two)  times daily.   Yes [provider]  cetirizine (ZYRTEC) 5 MG tablet Take 5 mg by mouth daily.   Yes [provider]  furosemide (LASIX) 20 MG tablet Take 20 mg by mouth daily as needed for fluid.    Yes [provider]  levothyroxine (SYNTHROID) 112 MCG tablet Take 100 mcg by mouth every morning. 12/01/14 01/01/21 Yes [provider]  Multiple Vitamin (MULTIVITAMIN WITH MINERALS) TABS tablet Take 1 tablet by mouth daily.   Yes [provider]  atorvastatin (LIPITOR) 80 MG tablet Take 1 tablet by mouth daily. 11/26/14 12/11/20  [provider]  ezetimibe (ZETIA) 10 MG tablet Take 10 mg by mouth daily. 12/20/18 12/11/20  [provider]  lisinopril (PRINIVIL,ZESTRIL) 2.5 MG tablet Take by mouth daily. 12/01/14 12/11/20  [provider]  metoprolol succinate (TOPROL-XL) 25 MG 24 hr tablet Take 1 tablet by mouth daily. 12/25/14 12/11/20  [provider]  spironolactone (ALDACTONE) 25 MG tablet Take 0.5 tablets by mouth daily. 12/01/14 12/11/20  [provider]    Allergies as of 11/13/2020 - Review Complete 07/12/2019  Allergen Reaction Noted   Oxycodone Shortness Of Breath 07/20/2016   Bupropion Itching 06/19/2019    Family History  Problem Relation Age of Onset   Leukemia Other     Social History   Socioeconomic History   Marital status: Widowed    Spouse name: Not on file   Number of children: Not on file   Years of education: Not on file   Highest education  level: Not on file  Occupational History   Not on file  Tobacco Use   Smoking status: Former    Types: Cigarettes    Quit date: 72    Years since quitting: 29.7   Smokeless tobacco: Never  Vaping Use   Vaping Use: Never used  Substance and Sexual Activity   Alcohol use: No   Drug use: No   Sexual activity: Not on file  Other Topics Concern   Not on file  Social History Narrative   Not on file   Social Determinants of Health    Financial Resource Strain: Not on file  Food Insecurity: Not on file  Transportation Needs: Not on file  Physical Activity: Not on file  Stress: Not on file  Social Connections: Not on file  Intimate Partner Violence: Not on file    Review of Systems: See HPI, otherwise negative ROS  Physical Exam: BP (!) 156/73   Pulse 86   Temp 98.1 F (36.7 C) (Temporal)   Wt 83.9 kg   SpO2 98%   BMI 30.79 kg/m  General:   Alert,  pleasant and cooperative in NAD Head:  Normocephalic and atraumatic. Lungs:  Clear to auscultation.    Heart:  Regular rate and rhythm.   Impression/Plan: Christie Carpenter is here for ophthalmic surgery.  Risks, benefits, limitations, and alternatives regarding ophthalmic surgery have been reviewed with the patient.  Questions have been answered.  All parties agreeable.   Leandrew Koyanagi, MD  01/01/2021, 12:13 PM

## 2021-01-01 NOTE — Op Note (Signed)
  LOCATION:  [place]   PREOPERATIVE DIAGNOSIS:    Nuclear sclerotic cataract right eye. H25.11   POSTOPERATIVE DIAGNOSIS:  Nuclear sclerotic cataract right eye.     PROCEDURE:  Phacoemusification with posterior chamber intraocular lens placement of the right eye   ULTRASOUND TIME: Procedure(s): CATARACT EXTRACTION PHACO AND INTRAOCULAR LENS PLACEMENT (IOC) RIGHT 8.88 01:11.0 (Right)  LENS:   Implant Name Type Inv. Item Serial No. Manufacturer Lot No. LRB No. Used Action  LENS IOL TECNIS EYHANCE 15.5 - [id number] Intraocular Lens LENS IOL TECNIS EYHANCE 15.5 [id number] [id number]   Right 1 Implanted         SURGEON:  Wyonia Hough, MD   ANESTHESIA:  Topical with tetracaine drops and 2% Xylocaine jelly, augmented with 1% preservative-free intracameral lidocaine.    COMPLICATIONS:  None.   DESCRIPTION OF PROCEDURE:  The patient was identified in the holding room and transported to the operating room and placed in the supine position under the operating microscope.  The right eye was identified as the operative eye and it was prepped and draped in the usual sterile ophthalmic fashion.   A 1 millimeter clear-corneal paracentesis was made at the 12:00 position.  0.5 ml of preservative-free 1% lidocaine was injected into the anterior chamber. The anterior chamber was filled with Viscoat viscoelastic.  A 2.4 millimeter keratome was used to make a near-clear corneal incision at the 9:00 position.  A curvilinear capsulorrhexis was made with a cystotome and capsulorrhexis forceps.  Balanced salt solution was used to hydrodissect and hydrodelineate the nucleus.   Phacoemulsification was then used in stop and chop fashion to remove the lens nucleus and epinucleus.  The remaining cortex was then removed using the irrigation and aspiration handpiece. Provisc was then placed into the capsular bag to distend it for lens placement.  A lens was then injected into the capsular bag.  The  remaining viscoelastic was aspirated.   Wounds were hydrated with balanced salt solution.  The anterior chamber was inflated to a physiologic pressure with balanced salt solution.  No wound leaks were noted. Cefuroxime 0.1 ml of a 10mg /ml solution was injected into the anterior chamber for a dose of 1 mg of intracameral antibiotic at the completion of the case.   Timolol and Brimonidine drops were applied to the eye.  The patient was taken to the recovery room in stable condition without complications of anesthesia or surgery.   Tanith Dagostino 01/01/2021, 1:07 PM

## 2021-01-01 NOTE — Anesthesia Postprocedure Evaluation (Addendum)
Anesthesia Post Note  Patient: Christie Carpenter  Procedure(s) Performed: CATARACT EXTRACTION PHACO AND INTRAOCULAR LENS PLACEMENT (IOC) RIGHT 8.88 01:11.0 (Right: Eye)     Patient location during evaluation: PACU Anesthesia Type: MAC Level of consciousness: awake and alert Pain management: pain level controlled Vital Signs Assessment: post-procedure vital signs reviewed and stable Respiratory status: spontaneous breathing, nonlabored ventilation and respiratory function stable Cardiovascular status: stable and blood pressure returned to baseline Postop Assessment: no apparent nausea or vomiting Anesthetic complications: no   No notable events documented.  April Manson

## 2021-01-02 ENCOUNTER — Encounter: Payer: Self-pay | Admitting: Ophthalmology

## 2021-02-23 IMAGING — CT CT ABD-PELV W/ CM
2 of 5 series · 16 of 46 positions shown, 18 images · IV contrast (APPLIED)
Comparison: 07/20/2016

CLINICAL DATA: Abdominal pain beginning last night with nausea.
History of non-Hodgkin's lymphoma.

EXAM:
CT ABDOMEN AND PELVIS WITH CONTRAST
TECHNIQUE: Multidetector CT imaging of the abdomen and pelvis was performed
using the standard protocol following bolus administration of
intravenous contrast.
CONTRAST:  100mL OMNIPAQUE IOHEXOL 300 MG/ML  SOLN

[Series 2: axial st · axial · 0.89mm/px · z∈[-466,-26]mm · 13 of 100 slices shown, 15 images]
[im 6/100  soft-tissue]
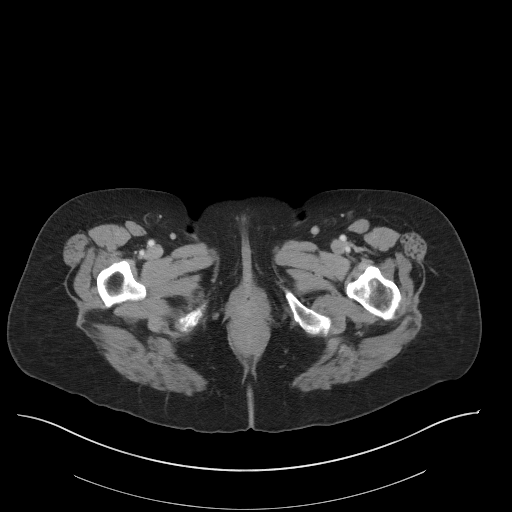
[im 6/100  bone]
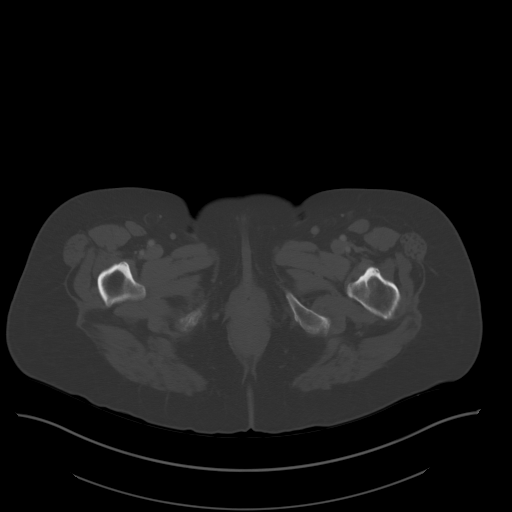
[im 12/100  soft-tissue]
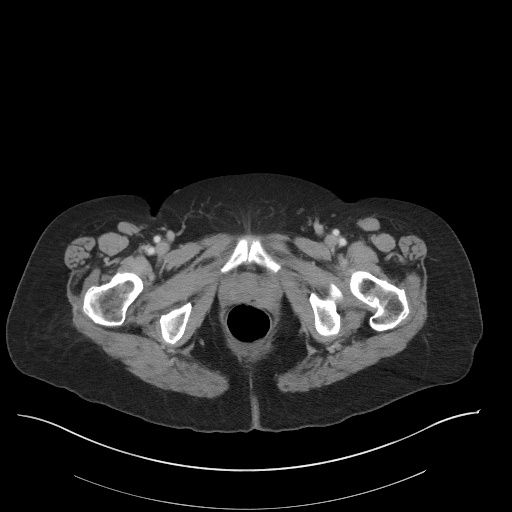
[im 23/100  soft-tissue]
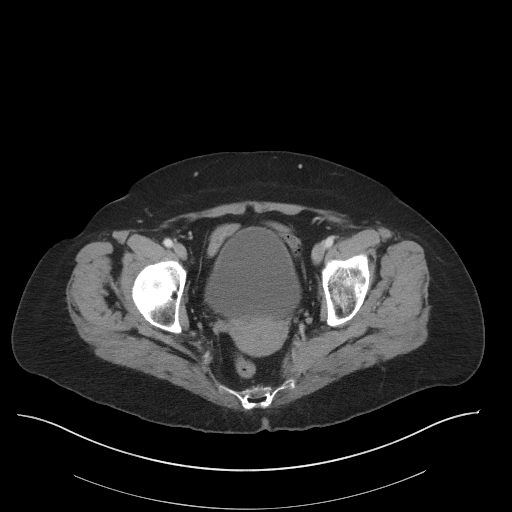
[im 28/100  soft-tissue]
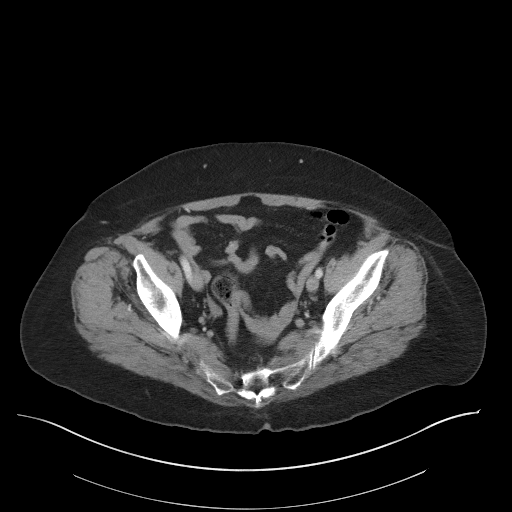
[im 34/100  soft-tissue]
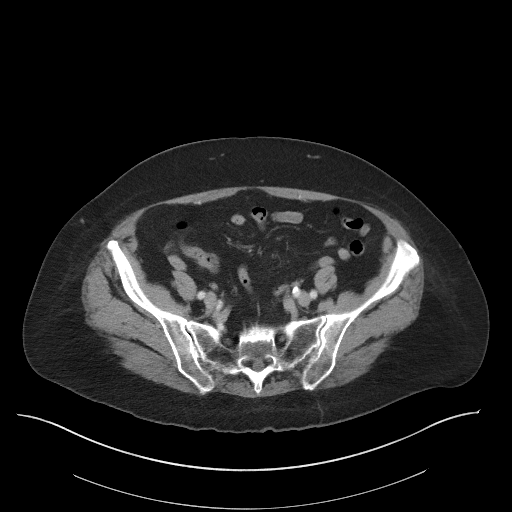
[im 45/100  soft-tissue]
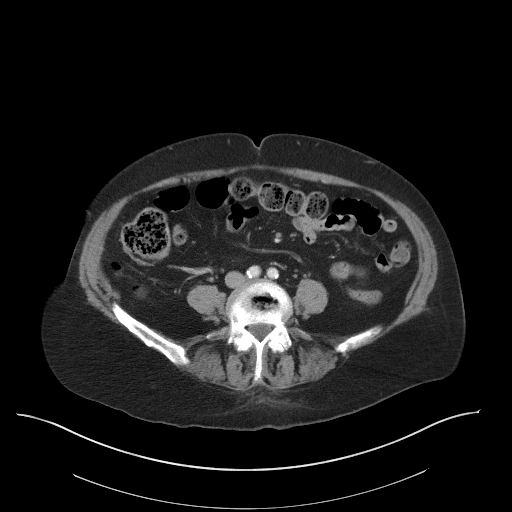
[im 50/100  soft-tissue]
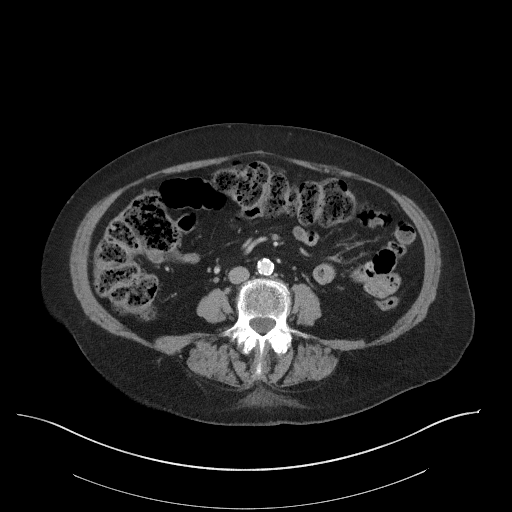
[im 56/100  soft-tissue]
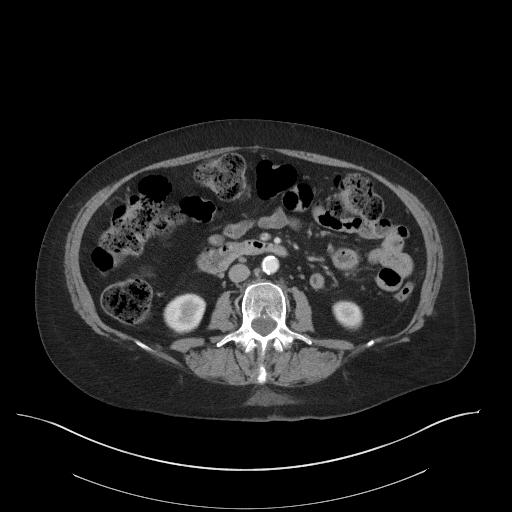
[im 67/100  soft-tissue]
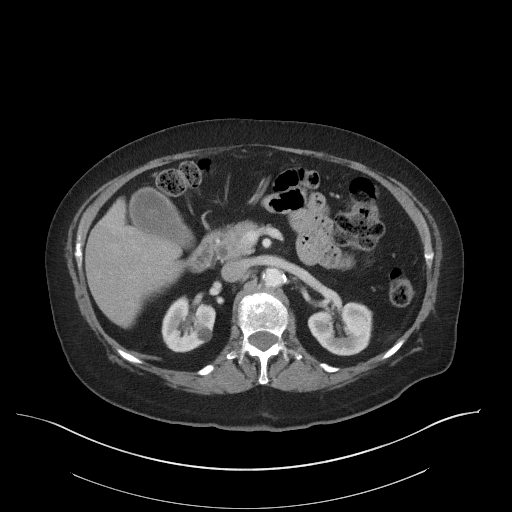
[im 67/100  bone]
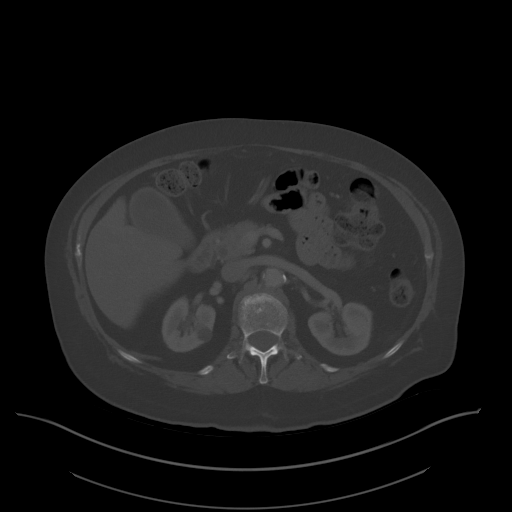
[im 72/100  soft-tissue]
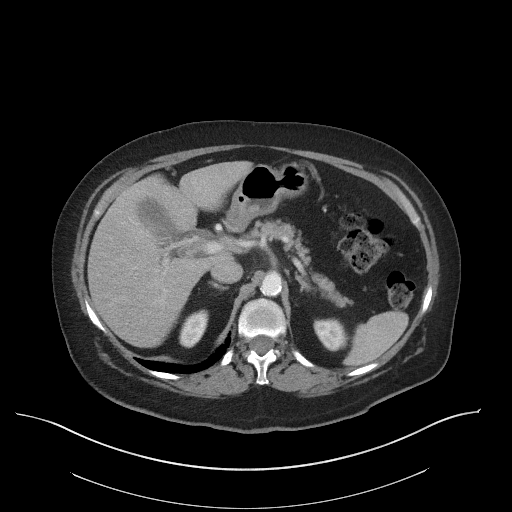
[im 78/100  soft-tissue]
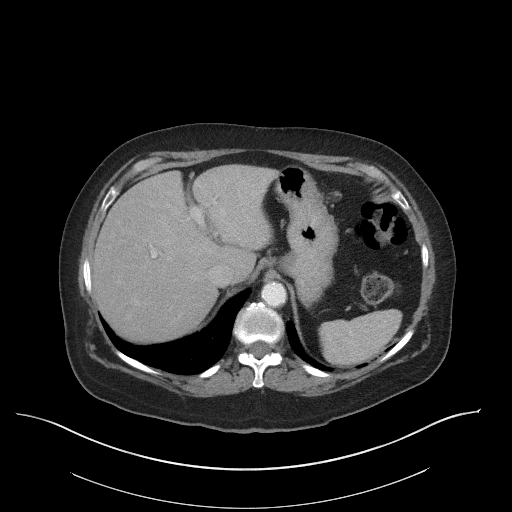
[im 89/100  soft-tissue]
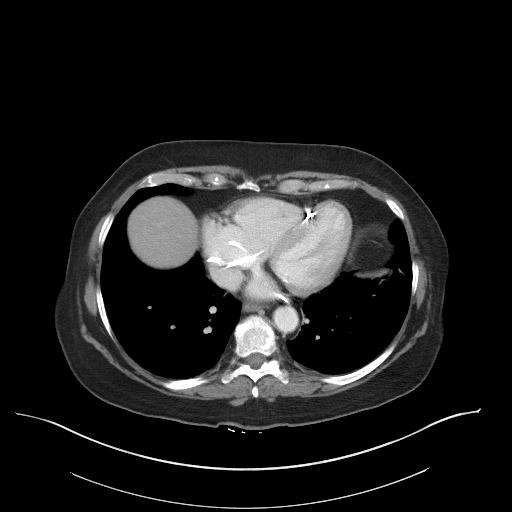
[im 94/100  soft-tissue]
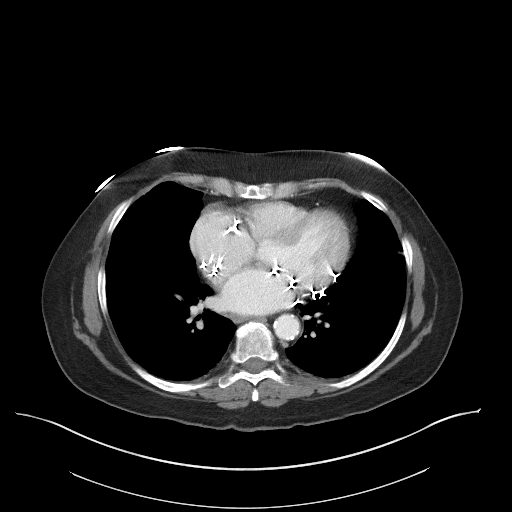

[Series 5: coronal st · coronal · 0.75mm/px · 3 of 95 slices shown]
[im 32/95  soft-tissue]
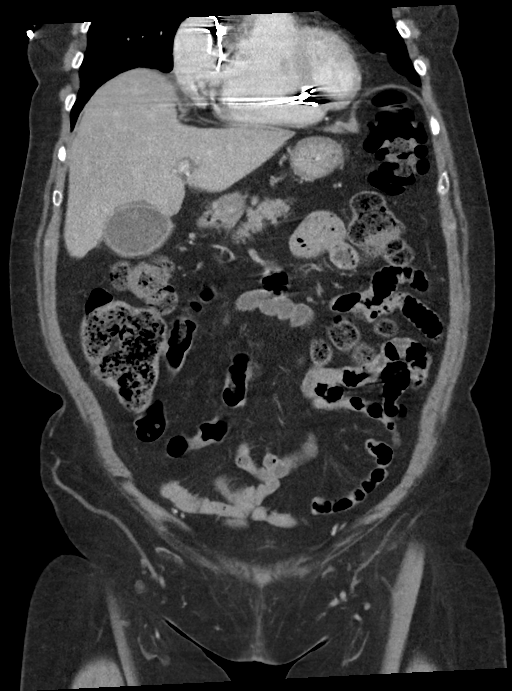
[im 42/95  soft-tissue]
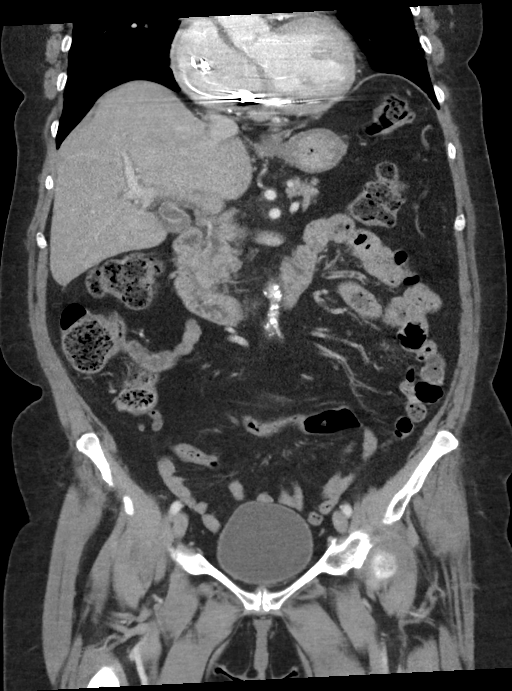
[im 53/95  soft-tissue]
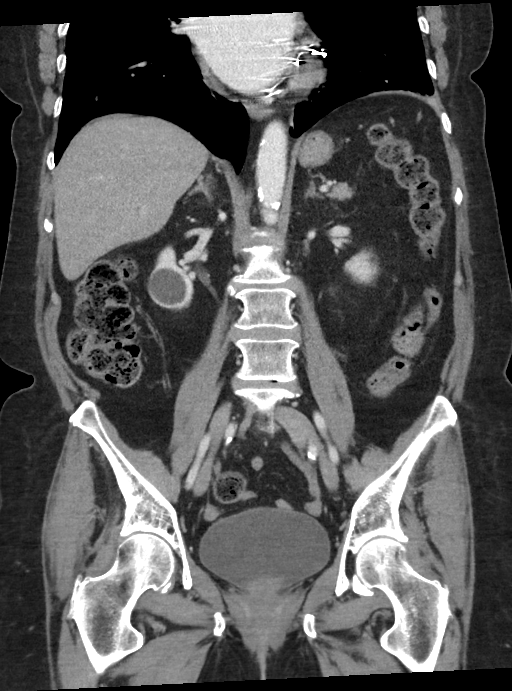

[16 of 46 positions shown; findings below may reference images not displayed]

FINDINGS: Lower chest: No acute abnormality.

Hepatobiliary: Normal liver.

Gallbladder is distended with mild wall thickening. Gallbladder
contains mildly opaque gallstones. No adjacent fat inflammation. No
bile duct dilation.

Pancreas: Unremarkable. No pancreatic ductal dilatation or
surrounding inflammatory changes.

Spleen: Normal in size without focal abnormality.

Adrenals/Urinary Tract: No adrenal masses.

Mild bilateral renal cortical thinning. Kidneys normal in size,
orientation and position with symmetric enhancement and excretion.
Two right renal cysts, midpole 3 cm cyst and upper pole 1 cm cyst.
No other renal masses, no stones and no hydronephrosis. Ureters are
normal in course and caliber. Bladder is unremarkable.

Stomach/Bowel: Normal stomach. Small bowel and colon are normal in
caliber. No wall thickening. No inflammation. Mild increased colonic
stool burden similar to the prior CT. Normal appendix visualized.

Vascular/Lymphatic: Aortic atherosclerosis. No aneurysm. No enlarged
lymph nodes.

Reproductive: Uterus and bilateral adnexa are unremarkable.

Other: No abdominal wall hernia or abnormality. No abdominopelvic
ascites.

Musculoskeletal: No fracture or acute finding. No osteoblastic or
osteolytic lesions.
IMPRESSION: 1. Gallbladder wall thickening with gallstones, consistent with
early acute cholecystitis in the proper clinical setting. Consider
further assessment with limited right upper quadrant ultrasound.
2. No other evidence of an acute abnormality within the abdomen or
pelvis.
3. Mild increased colonic stool burden.  No bowel inflammation.

## 2021-02-25 IMAGING — XA DG CHOLANGIOGRAM OPERATIVE
3 series · 8 of 8 positions shown · non-contrast
Comparison: Right upper quadrant from abdominal ultrasound -
06/19/2019; CT abdomen and pelvis - 06/19/2019

CLINICAL DATA: Intraoperative cholangiogram during laparoscopic
cholecystectomy.

EXAM:
INTRAOPERATIVE CHOLANGIOGRAM
FLUOROSCOPY TIME:  13 seconds

[Series 4: cont. · 2 of 2 frames shown (1 of 3)]
[frame 1/2]
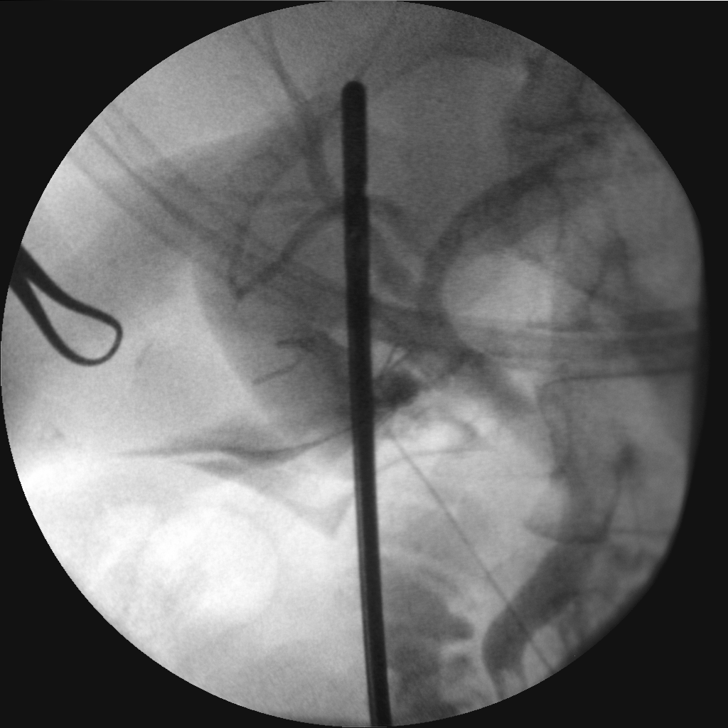
[frame 2/2]
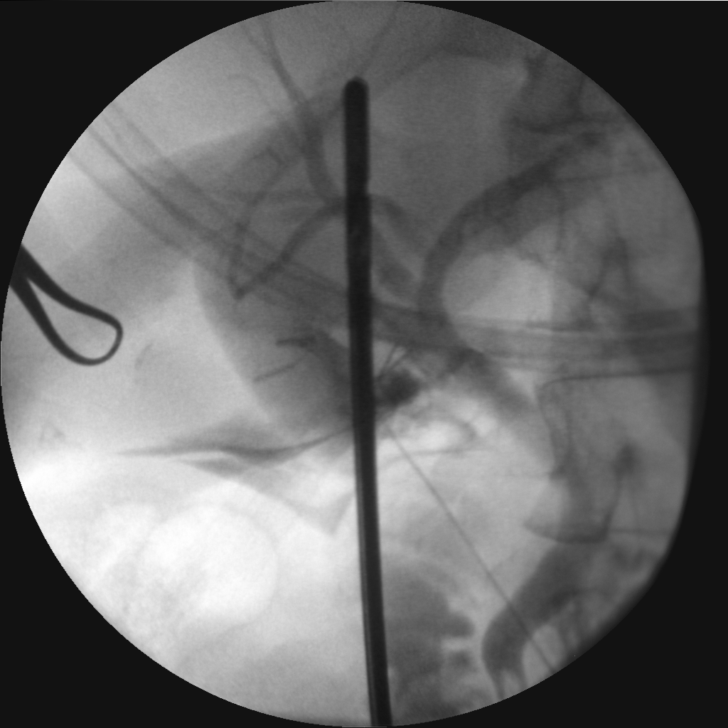

[Series 5: cont. · 2 of 2 frames shown (2 of 3)]
[frame 1/2]
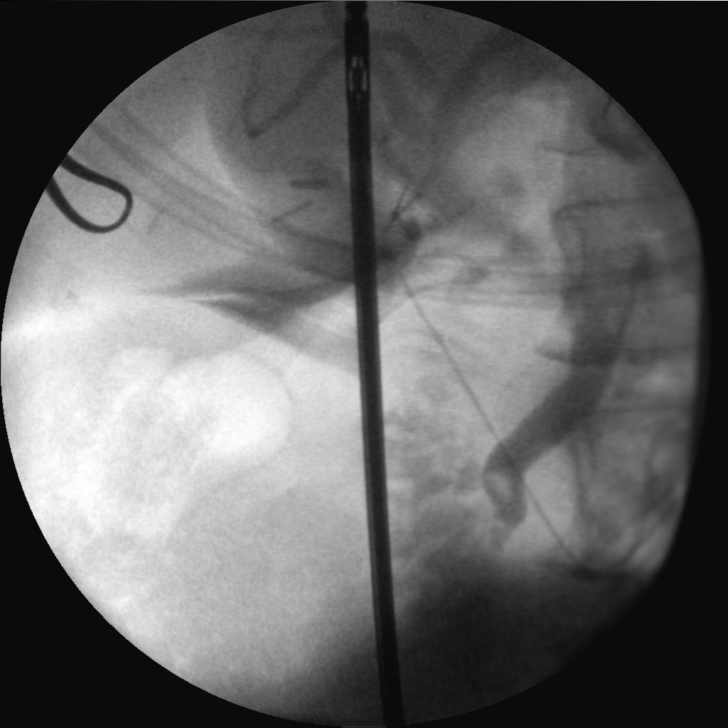
[frame 2/2]
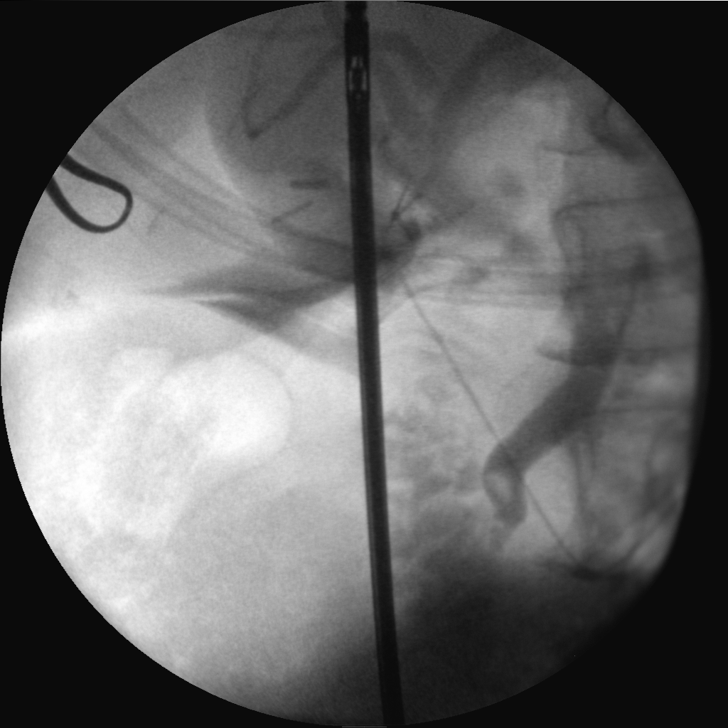

[Series 6: cont. · 4 of 47 frames shown (3 of 3)]
[frame 8/47]
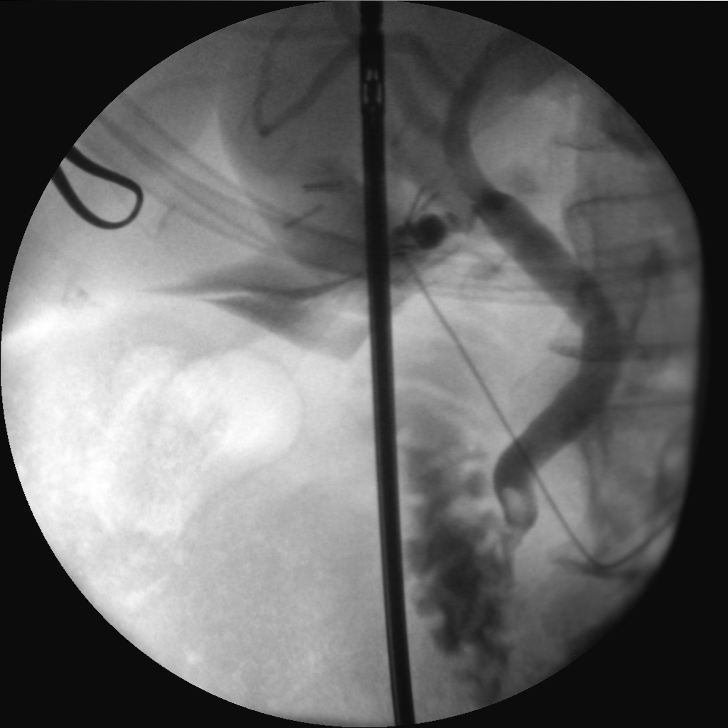
[frame 24/47]
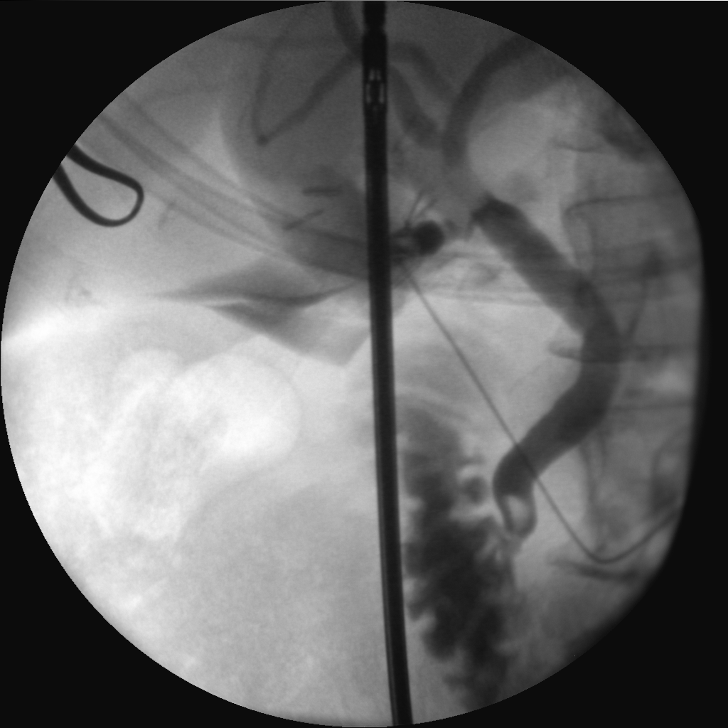
[frame 40/47]
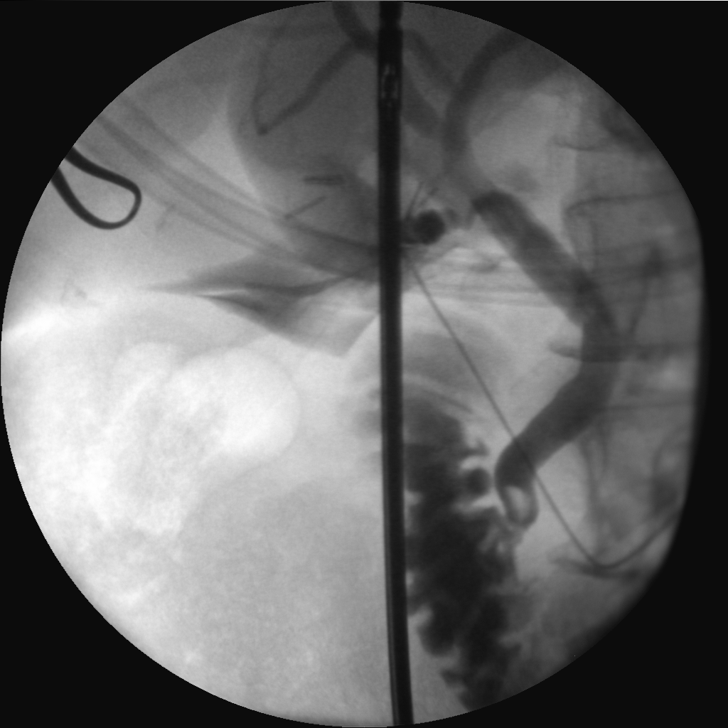
[frame 46/47]
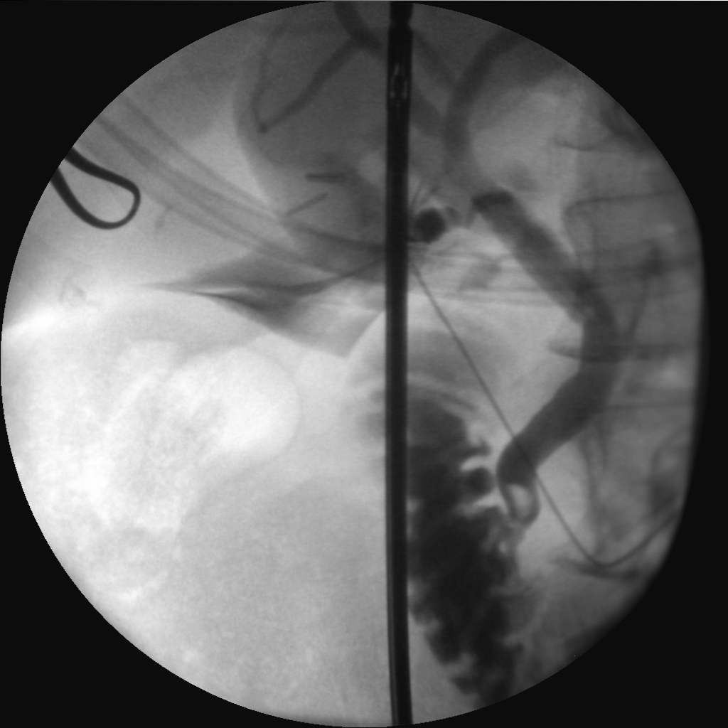

[8 of 8 positions shown; findings below may reference images not displayed]

FINDINGS: Intraoperative cholangiographic images of the right upper abdominal
quadrant during laparoscopic cholecystectomy are provided for
review.

Surgical clips overlie the expected location of the gallbladder
fossa.

Contrast injection demonstrates selective cannulation of the central
aspect of the cystic duct.

There is passage of contrast through the central aspect of the
cystic duct with filling of a non dilated common bile duct. There is
passage of contrast though the CBD and into the descending portion
of the duodenum.

There is minimal reflux of injected contrast into the common hepatic
duct and central aspect of the non dilated intrahepatic biliary
system.

There is a persistent nonocclusive filling defect in the distal
aspect of the CBD worrisome for choledocholithiasis.
IMPRESSION: Persistent nonocclusive filling defect within the distal aspect of
the CBD worrisome for choledocholithiasis.

## 2022-03-10 ENCOUNTER — Other Ambulatory Visit
Admission: RE | Admit: 2022-03-10 | Discharge: 2022-03-10 | Disposition: A | Discharge status: Home / Self Care | Payer: Medicare Other | Source: Ambulatory Visit | Attending: Family Medicine | Admitting: Family Medicine

## 2022-03-10 DIAGNOSIS — I5022 Chronic systolic (congestive) heart failure: Secondary | ICD-10-CM | POA: Insufficient documentation

## 2022-03-10 DIAGNOSIS — R0602 Shortness of breath: Secondary | ICD-10-CM | POA: Insufficient documentation

## 2022-03-10 LAB — BRAIN NATRIURETIC PEPTIDE: B Natriuretic Peptide: 200 pg/mL — ABNORMAL HIGH (ref 0.0–100.0)

## 2023-02-08 ENCOUNTER — Other Ambulatory Visit: Payer: Self-pay

## 2023-02-08 ENCOUNTER — Emergency Department: Payer: Medicare PPO

## 2023-02-08 ENCOUNTER — Emergency Department
Admission: EM | Admit: 2023-02-08 | Discharge: 2023-02-08 | Disposition: A | Discharge status: Home / Self Care | Payer: Medicare PPO | Attending: Emergency Medicine | Admitting: Emergency Medicine

## 2023-02-08 DIAGNOSIS — R8271 Bacteriuria: Secondary | ICD-10-CM

## 2023-02-08 DIAGNOSIS — Z8673 Personal history of transient ischemic attack (TIA), and cerebral infarction without residual deficits: Secondary | ICD-10-CM | POA: Diagnosis not present

## 2023-02-08 DIAGNOSIS — Y92010 Kitchen of single-family (private) house as the place of occurrence of the external cause: Secondary | ICD-10-CM | POA: Diagnosis not present

## 2023-02-08 DIAGNOSIS — M545 Low back pain, unspecified: Secondary | ICD-10-CM | POA: Diagnosis not present

## 2023-02-08 DIAGNOSIS — I700 Atherosclerosis of aorta: Secondary | ICD-10-CM | POA: Insufficient documentation

## 2023-02-08 DIAGNOSIS — W1830XA Fall on same level, unspecified, initial encounter: Secondary | ICD-10-CM | POA: Insufficient documentation

## 2023-02-08 DIAGNOSIS — E119 Type 2 diabetes mellitus without complications: Secondary | ICD-10-CM | POA: Insufficient documentation

## 2023-02-08 DIAGNOSIS — I2510 Atherosclerotic heart disease of native coronary artery without angina pectoris: Secondary | ICD-10-CM | POA: Insufficient documentation

## 2023-02-08 DIAGNOSIS — I509 Heart failure, unspecified: Secondary | ICD-10-CM | POA: Insufficient documentation

## 2023-02-08 DIAGNOSIS — R300 Dysuria: Secondary | ICD-10-CM | POA: Insufficient documentation

## 2023-02-08 DIAGNOSIS — W19XXXA Unspecified fall, initial encounter: Secondary | ICD-10-CM

## 2023-02-08 DIAGNOSIS — S0990XA Unspecified injury of head, initial encounter: Secondary | ICD-10-CM | POA: Insufficient documentation

## 2023-02-08 DIAGNOSIS — J984 Other disorders of lung: Secondary | ICD-10-CM | POA: Diagnosis not present

## 2023-02-08 DIAGNOSIS — Z7901 Long term (current) use of anticoagulants: Secondary | ICD-10-CM | POA: Diagnosis not present

## 2023-02-08 LAB — CBC WITH DIFFERENTIAL/PLATELET
Abs Immature Granulocytes: 0.08 10*3/uL — ABNORMAL HIGH (ref 0.00–0.07)
Basophils Absolute: 0.1 10*3/uL (ref 0.0–0.1)
Basophils Relative: 1 %
Eosinophils Absolute: 0.1 10*3/uL (ref 0.0–0.5)
Eosinophils Relative: 2 %
HCT: 39 % (ref 36.0–46.0)
Hemoglobin: 12.8 g/dL (ref 12.0–15.0)
Immature Granulocytes: 1 %
Lymphocytes Relative: 23 %
Lymphs Abs: 1.6 10*3/uL (ref 0.7–4.0)
MCH: 33.4 pg (ref 26.0–34.0)
MCHC: 32.8 g/dL (ref 30.0–36.0)
MCV: 101.8 fL — ABNORMAL HIGH (ref 80.0–100.0)
Monocytes Absolute: 0.4 10*3/uL (ref 0.1–1.0)
Monocytes Relative: 6 %
Neutro Abs: 4.6 10*3/uL (ref 1.7–7.7)
Neutrophils Relative %: 67 %
Platelets: 220 10*3/uL (ref 150–400)
RBC: 3.83 MIL/uL — ABNORMAL LOW (ref 3.87–5.11)
RDW: 13.7 % (ref 11.5–15.5)
WBC: 6.9 10*3/uL (ref 4.0–10.5)
nRBC: 0 % (ref 0.0–0.2)

## 2023-02-08 LAB — TROPONIN I (HIGH SENSITIVITY)
Troponin I (High Sensitivity): 11 ng/L (ref <18)
Troponin I (High Sensitivity): 13 ng/L (ref <18)

## 2023-02-08 LAB — URINALYSIS, W/ REFLEX TO CULTURE (INFECTION SUSPECTED)
Bilirubin Urine: NEGATIVE
Glucose, UA: NEGATIVE mg/dL
Ketones, ur: NEGATIVE mg/dL
Nitrite: NEGATIVE
Protein, ur: NEGATIVE mg/dL
Specific Gravity, Urine: 1.01 (ref 1.005–1.030)
pH: 7 (ref 5.0–8.0)

## 2023-02-08 LAB — COMPREHENSIVE METABOLIC PANEL
ALT: 30 U/L (ref 0–44)
AST: 29 U/L (ref 15–41)
Albumin: 4.1 g/dL (ref 3.5–5.0)
Alkaline Phosphatase: 65 U/L (ref 38–126)
Anion gap: 10 (ref 5–15)
BUN: 23 mg/dL (ref 8–23)
CO2: 25 mmol/L (ref 22–32)
Calcium: 9.7 mg/dL (ref 8.9–10.3)
Chloride: 101 mmol/L (ref 98–111)
Creatinine, Ser: 1 mg/dL (ref 0.44–1.00)
GFR, Estimated: 58 mL/min — ABNORMAL LOW (ref >60)
Glucose, Bld: 113 mg/dL — ABNORMAL HIGH (ref 70–99)
Potassium: 4.1 mmol/L (ref 3.5–5.1)
Sodium: 136 mmol/L (ref 135–145)
Total Bilirubin: 1.3 mg/dL — ABNORMAL HIGH (ref 0.3–1.2)
Total Protein: 7.5 g/dL (ref 6.5–8.1)

## 2023-02-08 MED ORDER — ACETAMINOPHEN 500 MG PO TABS
ORAL_TABLET | ORAL | Status: AC
  Administered 2023-02-08: 1000 mg via ORAL
  Filled 2023-02-08: qty 1

## 2023-02-08 MED ORDER — CEPHALEXIN 500 MG PO CAPS: 500.0000 mg | ORAL_CAPSULE | Freq: Four times a day (QID) | ORAL | 0 refills | Status: AC

## 2023-02-08 MED ORDER — CEPHALEXIN 500 MG PO CAPS
500.0000 mg | ORAL_CAPSULE | Freq: Once | ORAL | Status: AC
Start: 2023-02-08 — End: 2023-02-08
  Administered 2023-02-08: 500 mg via ORAL
  Filled 2023-02-08: qty 1

## 2023-02-08 MED ORDER — LIDOCAINE 5 % EX PTCH
1.0000 | MEDICATED_PATCH | CUTANEOUS | Status: DC
  Administered 2023-02-08: 1 via TRANSDERMAL
  Filled 2023-02-08: qty 1

## 2023-02-08 MED ORDER — LIDOCAINE 5 % EX PTCH: 1.0000 | MEDICATED_PATCH | Freq: Two times a day (BID) | CUTANEOUS | 0 refills | Status: AC

## 2023-02-08 MED ORDER — ACETAMINOPHEN 500 MG PO TABS
1000.0000 mg | ORAL_TABLET | Freq: Once | ORAL | Status: AC
  Filled 2023-02-08: qty 2

## 2023-02-08 NOTE — ED Provider Notes (Signed)
[facility] Provider Note    Event Date/Time   First MD Initiated Contact with Patient 02/08/23 0700     (approximate)   History   Fall (C/o lower back pain)   HPI  Christie Carpenter is a 77 y.o. female   Past medical history of prior stroke, atrial fibrillation on Eliquis, sick sinus syndrome with AICD, diabetes, CAD, CHF who presents to the emergency department with fall.  Happened this morning when she was in her kitchen, reaching for something in her refrigerator turned around and fell backwards onto her buttock striking the back of her head.  She was able to crawl to the bedroom and call for help.  She cannot completely recall the circumstances surrounding her fall though she did not lose consciousness and certainly denies any associated chest pain, palpitations, shortness of breath, dizziness or any other presyncopal symptoms before.  She cannot say with certain whether she slipped and fell lost her balance or what caused her fall.  She states that she has been feeling generalized weakness for quite some time now and has been seeing her doctors and cardiologist to workup this generalized weakness and fatigue and they are considering cardiac amyloidosis as a potential diagnosis.    She did not experience any ICD shock.  She has no acute worsening or no other acute medical complaints and on review of system denies any respiratory infectious complaints, urinary complaints, GI complaints.   External Medical Documents Reviewed: Telephone encounter with cardiology dated 01/26/2023 reviewing recent imaging that was not positive for cardiac amyloidosis      Physical Exam   Triage Vital Signs: ED Triage Vitals  Encounter Vitals Group     BP 02/08/23 0708 (!) 145/94     Systolic BP Percentile --      Diastolic BP Percentile --      Pulse Rate 02/08/23 0706 (!) 57     Resp 02/08/23 0706 20     Temp 02/08/23 0706 97.7 F (36.5 C)     Temp src --      SpO2  02/08/23 0706 99 %     Weight 02/08/23 0703 175 lb (79.4 kg)     Height 02/08/23 0703 5\' 5"  (1.651 m)     Head Circumference --      Peak Flow --      Pain Score --      Pain Loc --      Pain Education --      Exclude from Growth Chart --     Most recent vital signs: Vitals:   02/08/23 0708 02/08/23 0730  BP: (!) 145/94 (!) 147/62  Pulse:  (!) 56  Resp:  (!) 22  Temp:    SpO2:  100%    General: Awake, no distress.  CV:  Good peripheral perfusion.  Resp:  Normal effort. Abd:  No distention.  Other:  No signs of head trauma, neck supple with forage of motion no midline tenderness step-off or deformity.  She does have bilateral paraspinal tenderness to palpation on the lower back.  She is nontender to the chest wall, T-spine, abdomen, and is moving all extremities with full active range of motion.  She has chronic left hip pain that is unchanged from prior and is able to range at that joint.   ED Results / Procedures / Treatments   Labs (all labs ordered are listed, but only abnormal results are displayed) Labs Reviewed  COMPREHENSIVE METABOLIC PANEL - Abnormal; Notable  for the following components:      Result Value   Glucose, Bld 113 (*)    Total Bilirubin 1.3 (*)    GFR, Estimated 58 (*)    All other components within normal limits  CBC WITH DIFFERENTIAL/PLATELET - Abnormal; Notable for the following components:   RBC 3.83 (*)    MCV 101.8 (*)    Abs Immature Granulocytes 0.08 (*)    All other components within normal limits  URINALYSIS, W/ REFLEX TO CULTURE (INFECTION SUSPECTED)  TROPONIN I (HIGH SENSITIVITY)  TROPONIN I (HIGH SENSITIVITY)     I ordered and reviewed the above labs they are notable for cell counts and electrolytes largely unremarkable, initial troponin is negative.  EKG  ED ECG REPORT I, Pilar Jarvis, the attending physician, personally viewed and interpreted this ECG.   Date: 02/08/2023  EKG Time: 0717  Rate: 62  Rhythm: sinus  Axis: nl   Intervals:short pr, rbbb/lafb, pvc  ST&T Change: no stemi    RADIOLOGY I independently reviewed and interpreted CT scan of the head and see no obvious bleeding or midline shift I also reviewed radiologist's formal read.   PROCEDURES:  Critical Care performed: No  Procedures   MEDICATIONS ORDERED IN ED: Medications  lidocaine (LIDODERM) 5 % 1 patch (1 patch Transdermal Patch Applied 02/08/23 0803)  acetaminophen (TYLENOL) tablet 1,000 mg (1,000 mg Oral Given 02/08/23 0802)   IMPRESSION / MDM / ASSESSMENT AND PLAN / ED COURSE  I reviewed the triage vital signs and the nursing notes.                                Patient's presentation is most consistent with acute presentation with potential threat to life or bodily function.  Differential diagnosis includes, but is not limited to, blunt traumatic injury including C-spine fracture dislocation, ICH, skull fracture, hip fracture or dislocation, L-spine compression fracture --medical reasons for fall including arrhythmia, electrolyte disturbance, infection  The patient is on the cardiac monitor to evaluate for evidence of arrhythmia and/or significant heart rate changes.  MDM:    Patient with a fall on Eliquis, check CT head and neck, hip x-ray, lumbar x-ray which fortunately all are negative for acute traumatic pathologies.  Uncertain what caused her fall the appears to be most likely mechanical in nature as she was reaching for something in the fridge and fell backwards.  She has had generalized weakness for quite some time now being worked up as an outpatient, with no changes in her symptoms and no focal infectious symptoms and no reported symptoms concerning for arrhythmia or cardiac etiology.  Will check basic labs, urinalysis, troponins, and if unremarkable plan will be for discharge and ongoing PMD/cardiology follow-up.       FINAL CLINICAL IMPRESSION(S) / ED DIAGNOSES   Final diagnoses:  Fall, initial encounter   Minor closed head injury     Rx / DC Orders   ED Discharge Orders     None        Note:  This document was prepared using Dragon voice recognition software and may include unintentional dictation errors.    Pilar Jarvis, MD 02/08/23 [phone]

## 2023-02-08 NOTE — ED Notes (Signed)
Lab called back at this time- pt back from CT.

## 2023-02-08 NOTE — ED Triage Notes (Signed)
Pt in via [facility] from home for a fall this am. Pt fell backwardds in her kitchen. C/o lower back pain. Pt states she did hit head and is on blood thinners.

## 2023-02-08 NOTE — ED Notes (Signed)
Pt verbalizes understanding of discharge instructions. Opportunity for questioning and answers were provided. Pt discharged from ED to home with family.    

## 2023-02-08 NOTE — Discharge Instructions (Addendum)
Fortunately your evaluation in the emergency department did not show any emergency conditions that account for your fall nor any emergency injuries resulting from your fall.  Use Tylenol 650 mg every 6 hours and lidocaine patches for pain.  They were some bacteria in your urine which may represent an early urine infection for which you should take 5 days of antibiotics as prescribed.   Please do follow-up with both your cardiologist and your primary doctor to continue testing for what may be causing your ongoing generalized weakness and fatigue.  Thank you for choosing [place] for your health care today!  Please see your primary doctor this week for a follow up appointment.   If you have any new, worsening, or unexpected symptoms call your doctor right away or come back to the emergency department for reevaluation.  It was my pleasure to care for you today.   Daneil Dan [place] Charon, MD

## 2023-02-08 NOTE — ED Notes (Signed)
Pt ambulated to the bathroom. Gait steady. Pt states she's in a lot of pain. Rates at 10/10.

## 2023-02-08 NOTE — ED Notes (Signed)
Lab called at this time for blood draw.

## 2023-02-08 NOTE — ED Notes (Signed)
Pt to CT

## 2023-02-08 NOTE — ED Notes (Signed)
Called lab for blood draw

## 2023-02-09 LAB — URINE CULTURE: Culture: <10000 — AB

## 2023-11-02 ENCOUNTER — Ambulatory Visit: Admission: RE | Admit: 2023-11-02 | Source: Ambulatory Visit

## 2023-11-02 ENCOUNTER — Other Ambulatory Visit: Payer: Self-pay | Admitting: Physician Assistant

## 2023-11-02 DIAGNOSIS — M25551 Pain in right hip: Secondary | ICD-10-CM

## 2023-11-03 ENCOUNTER — Ambulatory Visit
Admission: RE | Admit: 2023-11-03 | Discharge: 2023-11-03 | Disposition: A | Discharge status: Home / Self Care | Source: Ambulatory Visit | Attending: Physician Assistant | Admitting: Physician Assistant

## 2023-11-03 DIAGNOSIS — M25551 Pain in right hip: Secondary | ICD-10-CM | POA: Diagnosis present
